# Patient Record
Sex: Female | Born: 1955 | Race: Black or African American | Hispanic: No | Marital: Single | Smoking: Never smoker
Health system: Southern US, Community
[De-identification: ages and names within clinical notes are randomized; demographics above are authoritative.]

## PROBLEM LIST (undated history)

## (undated) DIAGNOSIS — I639 Cerebral infarction, unspecified: Secondary | ICD-10-CM

## (undated) DIAGNOSIS — E7800 Pure hypercholesterolemia, unspecified: Secondary | ICD-10-CM

## (undated) DIAGNOSIS — I10 Essential (primary) hypertension: Secondary | ICD-10-CM

## (undated) HISTORY — PX: JOINT REPLACEMENT: SHX530

## (undated) HISTORY — PX: GASTROSTOMY W/ FEEDING TUBE: SUR642

## (undated) HISTORY — PX: ABDOMINAL HYSTERECTOMY: SHX81

## (undated) HISTORY — PX: CORONARY ARTERY BYPASS GRAFT: SHX141

## (undated) NOTE — *Deleted (*Deleted)
CRITICAL VALUE ALERT  Critical Value:  INR 5.5  Date & Time Notied:  07/16/2020 @ 0850  Provider Notified: Caleb Popp (Page)  Orders Received/Actions taken: Awaiting orders

---

## 2008-09-04 ENCOUNTER — Encounter: Payer: Self-pay | Admitting: Orthopedic Surgery

## 2008-09-14 ENCOUNTER — Encounter: Payer: Self-pay | Admitting: Orthopedic Surgery

## 2008-10-15 ENCOUNTER — Encounter: Payer: Self-pay | Admitting: Orthopedic Surgery

## 2009-08-26 ENCOUNTER — Encounter: Payer: Self-pay | Admitting: Orthopedic Surgery

## 2009-09-14 ENCOUNTER — Encounter: Payer: Self-pay | Admitting: Orthopedic Surgery

## 2010-06-05 ENCOUNTER — Encounter: Payer: Self-pay | Admitting: Orthopedic Surgery

## 2010-06-14 ENCOUNTER — Encounter: Payer: Self-pay | Admitting: Orthopedic Surgery

## 2017-12-09 DIAGNOSIS — I342 Nonrheumatic mitral (valve) stenosis: Secondary | ICD-10-CM

## 2017-12-09 HISTORY — DX: Nonrheumatic mitral (valve) stenosis: I34.2

## 2018-03-19 ENCOUNTER — Emergency Department (HOSPITAL_COMMUNITY)
Admission: EM | Admit: 2018-03-19 | Discharge: 2018-03-19 | Disposition: A | Discharge status: Home / Self Care | Payer: Self-pay | Attending: Emergency Medicine | Admitting: Emergency Medicine

## 2018-03-19 ENCOUNTER — Emergency Department (HOSPITAL_COMMUNITY): Payer: Self-pay

## 2018-03-19 ENCOUNTER — Other Ambulatory Visit: Payer: Self-pay

## 2018-03-19 ENCOUNTER — Encounter (HOSPITAL_COMMUNITY): Payer: Self-pay | Admitting: Emergency Medicine

## 2018-03-19 DIAGNOSIS — Z951 Presence of aortocoronary bypass graft: Secondary | ICD-10-CM | POA: Insufficient documentation

## 2018-03-19 DIAGNOSIS — M436 Torticollis: Secondary | ICD-10-CM | POA: Insufficient documentation

## 2018-03-19 DIAGNOSIS — I10 Essential (primary) hypertension: Secondary | ICD-10-CM | POA: Insufficient documentation

## 2018-03-19 HISTORY — DX: Pure hypercholesterolemia, unspecified: E78.00

## 2018-03-19 HISTORY — DX: Essential (primary) hypertension: I10

## 2018-03-19 MED ORDER — TRAMADOL HCL 50 MG PO TABS: 50.0000 mg | ORAL_TABLET | Freq: Four times a day (QID) | ORAL | 0 refills | Status: DC | PRN

## 2018-03-19 MED ORDER — CYCLOBENZAPRINE HCL 5 MG PO TABS: 5.0000 mg | ORAL_TABLET | Freq: Three times a day (TID) | ORAL | 0 refills | Status: DC | PRN

## 2018-03-19 MED ORDER — TRAMADOL HCL 50 MG PO TABS
50.0000 mg | ORAL_TABLET | Freq: Once | ORAL | Status: AC
  Administered 2018-03-19: 50 mg via ORAL
  Filled 2018-03-19: qty 1

## 2018-03-19 NOTE — ED Triage Notes (Signed)
Pt reports neck pain since Wednesday and she couldn't sleep last night.  Tylenol taken last night with no change.

## 2018-03-19 NOTE — Discharge Instructions (Addendum)
Alternate ice and heat to your neck.  Follow-up with your primary doctor for recheck or return to the ER for any worsening symptoms. °

## 2018-03-21 NOTE — ED Provider Notes (Signed)
[facility] EMERGENCY DEPARTMENT Provider Note   CSN: [id number] Arrival date & time: 03/19/18  0753     History   Chief Complaint Chief Complaint  Patient presents with  . Neck Pain    HPI Annette Muscatula Edler is a 62 y.o. female.  HPI  Annette Rogers is a 62 y.o. female who presents to the Emergency Department complaining of neck pain for 3 days.  She describes the pain as aching along the lower portion of her cervical spine and radiates to her left shoulder.  Pain is worse with movement of her neck and improves with rest.  States the pain was so bad on the night prior to arrival that she was unable to sleep.  She took tylenol without relief.  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   She denies chest pain, jaw pain, shortness of breath, pain, weakness or numbness of the upper extremities.    Past Medical History:  Diagnosis Date  . High cholesterol   . Hypertension     There are no active problems to display for this patient.   Past Surgical History:  Procedure Laterality Date  . ABDOMINAL HYSTERECTOMY    . CORONARY ARTERY BYPASS GRAFT    . JOINT REPLACEMENT       OB History   None      Home Medications    Prior to Admission medications   Medication Sig Start Date End Date Taking? Authorizing Provider  cyclobenzaprine (FLEXERIL) 5 MG tablet Take 1 tablet (5 mg total) by mouth 3 (three) times daily as needed. For muscle spasms.  May cause drowsiness 03/19/18   Ryenne Lynam, PA-C  traMADol (ULTRAM) 50 MG tablet Take 1 tablet (50 mg total) by mouth every 6 (six) hours as needed. 03/19/18   Pauline Ausriplett, Sanjit Mcmichael, PA-C    Family History History reviewed. No pertinent family history.  Social History Social History   Tobacco Use  . Smoking status: Never Smoker  Substance Use Topics  . Alcohol use: Never    Frequency: Never  . Drug use: Never     Allergies   Patient has no known allergies.   Review of Systems Review  of Systems  Constitutional: Negative for chills and fever.  HENT: Negative for facial swelling and trouble swallowing.   Eyes: Negative for visual disturbance.  Respiratory: Negative for shortness of breath.   Cardiovascular: Negative for chest pain.  Gastrointestinal: Negative for abdominal pain, nausea and vomiting.  Musculoskeletal: Positive for neck pain. Negative for arthralgias and joint swelling.  Skin: Negative for color change and rash.  Neurological: Negative for dizziness, syncope, weakness, numbness and headaches.  All other systems reviewed and are negative.    Physical Exam Updated Vital Signs BP (!) 162/72   Pulse 75   Temp 98.6 F (37 C)   Resp 16   Ht 5\' 3"  (1.6 m)   Wt 82.6 kg (182 lb)   SpO2 96%   BMI 32.24 kg/m   Physical Exam  Constitutional: She appears well-developed and well-nourished. No distress.  HENT:  Head: Atraumatic.  Mouth/Throat: Oropharynx is clear and moist.  Eyes: Pupils are equal, round, and reactive to light. EOM are normal.  Neck: Phonation normal. Spinous process tenderness and muscular tenderness present. No neck rigidity. No edema present.  ttp of the lower cervical spine and left paraspinal muscles.  No bony step offs  Cardiovascular: Normal rate, regular rhythm and intact distal pulses.  Pulmonary/Chest:  Effort normal and breath sounds normal. No respiratory distress. She exhibits no tenderness.  Musculoskeletal:  Grip strength is strong and symmetrical.    Neurological: She is alert. She has normal strength. No sensory deficit. GCS eye subscore is 4. GCS verbal subscore is 5. GCS motor subscore is 6.  CN II-XII intact.  No pronator drift, speech clear.    Skin: Skin is warm. Capillary refill takes less than 2 seconds. No rash noted.  Psychiatric: She has a normal mood and affect.  Nursing note and vitals reviewed.    ED Treatments / Results  Labs (all labs ordered are listed, but only abnormal results are displayed) Labs  Reviewed - No data to display  EKG None  Radiology No results found.  Procedures Procedures (including critical care time)  Medications Ordered in ED Medications  traMADol (ULTRAM) tablet 50 mg (50 mg Oral Given 03/19/18 0917)     Initial Impression / Assessment and Plan / ED Course  I have reviewed the triage vital signs and the nursing notes.  Pertinent labs & imaging results that were available during my care of the patient were reviewed by me and considered in my medical decision making (see chart for details).     Pt well appearing.  NV intact.  Pain to neck is reproduced with palpation and rotation.  Sx's felt to be related to torticollis.    XR results reviewed.  Pt reports feeling better after medications.  Agrees to close PCP f/u.  Return precautions discussed.    Final Clinical Impressions(s) / ED Diagnoses   Final diagnoses:  Torticollis    ED Discharge Orders        Ordered    traMADol (ULTRAM) 50 MG tablet  Every 6 hours PRN     03/19/18 1006    cyclobenzaprine (FLEXERIL) 5 MG tablet  3 times daily PRN     03/19/18 1006       Roxanne Orner, [place], PA-C 03/21/18 2239    Linwood Dibbles, MD 03/22/18 1318

## 2020-04-23 DIAGNOSIS — I272 Pulmonary hypertension, unspecified: Secondary | ICD-10-CM | POA: Insufficient documentation

## 2020-04-23 DIAGNOSIS — J449 Chronic obstructive pulmonary disease, unspecified: Secondary | ICD-10-CM

## 2020-04-23 DIAGNOSIS — E7800 Pure hypercholesterolemia, unspecified: Secondary | ICD-10-CM | POA: Insufficient documentation

## 2020-04-23 DIAGNOSIS — I480 Paroxysmal atrial fibrillation: Secondary | ICD-10-CM

## 2020-04-23 HISTORY — DX: Chronic obstructive pulmonary disease, unspecified: J44.9

## 2020-04-23 HISTORY — DX: Paroxysmal atrial fibrillation: I48.0

## 2020-04-25 DIAGNOSIS — Z9889 Other specified postprocedural states: Secondary | ICD-10-CM

## 2020-04-25 HISTORY — DX: Other specified postprocedural states: Z98.890

## 2020-04-27 DIAGNOSIS — I63511 Cerebral infarction due to unspecified occlusion or stenosis of right middle cerebral artery: Secondary | ICD-10-CM | POA: Insufficient documentation

## 2020-04-29 DIAGNOSIS — Z954 Presence of other heart-valve replacement: Secondary | ICD-10-CM

## 2020-04-29 DIAGNOSIS — G8194 Hemiplegia, unspecified affecting left nondominant side: Secondary | ICD-10-CM

## 2020-04-29 DIAGNOSIS — Z789 Other specified health status: Secondary | ICD-10-CM | POA: Insufficient documentation

## 2020-04-29 HISTORY — DX: Hemiplegia, unspecified affecting left nondominant side: G81.94

## 2020-04-29 HISTORY — DX: Presence of other heart-valve replacement: Z95.4

## 2020-05-06 DIAGNOSIS — D75839 Thrombocytosis, unspecified: Secondary | ICD-10-CM | POA: Insufficient documentation

## 2020-05-15 DIAGNOSIS — Z7901 Long term (current) use of anticoagulants: Secondary | ICD-10-CM | POA: Insufficient documentation

## 2020-05-31 DIAGNOSIS — E213 Hyperparathyroidism, unspecified: Secondary | ICD-10-CM

## 2020-05-31 DIAGNOSIS — Z934 Other artificial openings of gastrointestinal tract status: Secondary | ICD-10-CM

## 2020-05-31 HISTORY — DX: Other artificial openings of gastrointestinal tract status: Z93.4

## 2020-05-31 HISTORY — DX: Hyperparathyroidism, unspecified: E21.3

## 2020-07-15 ENCOUNTER — Inpatient Hospital Stay (HOSPITAL_COMMUNITY)
Admission: EM | Admit: 2020-07-15 | Discharge: 2020-07-24 | DRG: 641 | Disposition: A | Discharge status: Home Health | Payer: Medicare Other | Attending: Family Medicine | Admitting: Family Medicine

## 2020-07-15 ENCOUNTER — Other Ambulatory Visit: Payer: Self-pay

## 2020-07-15 ENCOUNTER — Encounter (HOSPITAL_COMMUNITY): Payer: Self-pay | Admitting: Emergency Medicine

## 2020-07-15 DIAGNOSIS — E213 Hyperparathyroidism, unspecified: Secondary | ICD-10-CM

## 2020-07-15 DIAGNOSIS — Z952 Presence of prosthetic heart valve: Secondary | ICD-10-CM

## 2020-07-15 DIAGNOSIS — Z90710 Acquired absence of both cervix and uterus: Secondary | ICD-10-CM

## 2020-07-15 DIAGNOSIS — K9423 Gastrostomy malfunction: Secondary | ICD-10-CM | POA: Diagnosis present

## 2020-07-15 DIAGNOSIS — I69322 Dysarthria following cerebral infarction: Secondary | ICD-10-CM

## 2020-07-15 DIAGNOSIS — I69391 Dysphagia following cerebral infarction: Secondary | ICD-10-CM

## 2020-07-15 DIAGNOSIS — Z8249 Family history of ischemic heart disease and other diseases of the circulatory system: Secondary | ICD-10-CM

## 2020-07-15 DIAGNOSIS — K259 Gastric ulcer, unspecified as acute or chronic, without hemorrhage or perforation: Secondary | ICD-10-CM | POA: Insufficient documentation

## 2020-07-15 DIAGNOSIS — I69354 Hemiplegia and hemiparesis following cerebral infarction affecting left non-dominant side: Secondary | ICD-10-CM

## 2020-07-15 DIAGNOSIS — B952 Enterococcus as the cause of diseases classified elsewhere: Secondary | ICD-10-CM | POA: Diagnosis present

## 2020-07-15 DIAGNOSIS — I10 Essential (primary) hypertension: Secondary | ICD-10-CM | POA: Diagnosis present

## 2020-07-15 DIAGNOSIS — Z515 Encounter for palliative care: Secondary | ICD-10-CM

## 2020-07-15 DIAGNOSIS — R531 Weakness: Secondary | ICD-10-CM | POA: Diagnosis not present

## 2020-07-15 DIAGNOSIS — M549 Dorsalgia, unspecified: Secondary | ICD-10-CM

## 2020-07-15 DIAGNOSIS — Z7189 Other specified counseling: Secondary | ICD-10-CM

## 2020-07-15 DIAGNOSIS — D509 Iron deficiency anemia, unspecified: Secondary | ICD-10-CM

## 2020-07-15 DIAGNOSIS — R1110 Vomiting, unspecified: Secondary | ICD-10-CM

## 2020-07-15 DIAGNOSIS — D638 Anemia in other chronic diseases classified elsewhere: Secondary | ICD-10-CM | POA: Diagnosis present

## 2020-07-15 DIAGNOSIS — E876 Hypokalemia: Secondary | ICD-10-CM | POA: Diagnosis present

## 2020-07-15 DIAGNOSIS — Z9889 Other specified postprocedural states: Secondary | ICD-10-CM

## 2020-07-15 DIAGNOSIS — M7989 Other specified soft tissue disorders: Secondary | ICD-10-CM

## 2020-07-15 DIAGNOSIS — R791 Abnormal coagulation profile: Secondary | ICD-10-CM | POA: Diagnosis present

## 2020-07-15 DIAGNOSIS — Z7901 Long term (current) use of anticoagulants: Secondary | ICD-10-CM

## 2020-07-15 DIAGNOSIS — K219 Gastro-esophageal reflux disease without esophagitis: Secondary | ICD-10-CM

## 2020-07-15 DIAGNOSIS — I480 Paroxysmal atrial fibrillation: Secondary | ICD-10-CM | POA: Diagnosis present

## 2020-07-15 DIAGNOSIS — E785 Hyperlipidemia, unspecified: Secondary | ICD-10-CM | POA: Diagnosis present

## 2020-07-15 DIAGNOSIS — M79602 Pain in left arm: Secondary | ICD-10-CM

## 2020-07-15 DIAGNOSIS — Z7982 Long term (current) use of aspirin: Secondary | ICD-10-CM

## 2020-07-15 DIAGNOSIS — R54 Age-related physical debility: Secondary | ICD-10-CM | POA: Diagnosis present

## 2020-07-15 DIAGNOSIS — G8194 Hemiplegia, unspecified affecting left nondominant side: Secondary | ICD-10-CM | POA: Diagnosis present

## 2020-07-15 DIAGNOSIS — I342 Nonrheumatic mitral (valve) stenosis: Secondary | ICD-10-CM | POA: Diagnosis present

## 2020-07-15 DIAGNOSIS — Y848 Other medical procedures as the cause of abnormal reaction of the patient, or of later complication, without mention of misadventure at the time of the procedure: Secondary | ICD-10-CM | POA: Diagnosis present

## 2020-07-15 DIAGNOSIS — Z20822 Contact with and (suspected) exposure to covid-19: Secondary | ICD-10-CM | POA: Diagnosis present

## 2020-07-15 DIAGNOSIS — Z954 Presence of other heart-valve replacement: Secondary | ICD-10-CM

## 2020-07-15 DIAGNOSIS — E210 Primary hyperparathyroidism: Secondary | ICD-10-CM | POA: Diagnosis present

## 2020-07-15 DIAGNOSIS — Z79899 Other long term (current) drug therapy: Secondary | ICD-10-CM

## 2020-07-15 DIAGNOSIS — D649 Anemia, unspecified: Secondary | ICD-10-CM | POA: Diagnosis present

## 2020-07-15 DIAGNOSIS — N390 Urinary tract infection, site not specified: Secondary | ICD-10-CM | POA: Diagnosis present

## 2020-07-15 DIAGNOSIS — E871 Hypo-osmolality and hyponatremia: Secondary | ICD-10-CM | POA: Diagnosis present

## 2020-07-15 DIAGNOSIS — J449 Chronic obstructive pulmonary disease, unspecified: Secondary | ICD-10-CM | POA: Diagnosis present

## 2020-07-15 DIAGNOSIS — Z951 Presence of aortocoronary bypass graft: Secondary | ICD-10-CM

## 2020-07-15 HISTORY — DX: Hypercalcemia: E83.52

## 2020-07-15 HISTORY — DX: Gastro-esophageal reflux disease without esophagitis: K21.9

## 2020-07-15 LAB — COMPREHENSIVE METABOLIC PANEL
ALT: 17 U/L (ref 0–44)
AST: 42 U/L — ABNORMAL HIGH (ref 15–41)
Albumin: 3.5 g/dL (ref 3.5–5.0)
Alkaline Phosphatase: 92 U/L (ref 38–126)
Anion gap: 13 (ref 5–15)
BUN: 26 mg/dL — ABNORMAL HIGH (ref 8–23)
CO2: 25 mmol/L (ref 22–32)
Calcium: 14.5 mg/dL (ref 8.9–10.3)
Chloride: 96 mmol/L — ABNORMAL LOW (ref 98–111)
Creatinine, Ser: 0.73 mg/dL (ref 0.44–1.00)
GFR, Estimated: >60 mL/min (ref >60)
Glucose, Bld: 96 mg/dL (ref 70–99)
Potassium: 3.2 mmol/L — ABNORMAL LOW (ref 3.5–5.1)
Sodium: 134 mmol/L — ABNORMAL LOW (ref 135–145)
Total Bilirubin: 1.5 mg/dL — ABNORMAL HIGH (ref 0.3–1.2)
Total Protein: 8 g/dL (ref 6.5–8.1)

## 2020-07-15 LAB — URINALYSIS, ROUTINE W REFLEX MICROSCOPIC
Bilirubin Urine: NEGATIVE
Glucose, UA: NEGATIVE mg/dL
Ketones, ur: 80 mg/dL — AB
Nitrite: NEGATIVE
Protein, ur: 30 mg/dL — AB
Specific Gravity, Urine: 1.02 (ref 1.005–1.030)
WBC, UA: >50 WBC/hpf — ABNORMAL HIGH (ref 0–5)
pH: 6 (ref 5.0–8.0)

## 2020-07-15 LAB — CBC WITH DIFFERENTIAL/PLATELET
Abs Immature Granulocytes: 0.03 10*3/uL (ref 0.00–0.07)
Basophils Absolute: 0 10*3/uL (ref 0.0–0.1)
Basophils Relative: 0 %
Eosinophils Absolute: 0.1 10*3/uL (ref 0.0–0.5)
Eosinophils Relative: 1 %
HCT: 37.5 % (ref 36.0–46.0)
Hemoglobin: 11.3 g/dL — ABNORMAL LOW (ref 12.0–15.0)
Immature Granulocytes: 0 %
Lymphocytes Relative: 21 %
Lymphs Abs: 2.3 10*3/uL (ref 0.7–4.0)
MCH: 26.2 pg (ref 26.0–34.0)
MCHC: 30.1 g/dL (ref 30.0–36.0)
MCV: 87 fL (ref 80.0–100.0)
Monocytes Absolute: 0.6 10*3/uL (ref 0.1–1.0)
Monocytes Relative: 6 %
Neutro Abs: 7.6 10*3/uL (ref 1.7–7.7)
Neutrophils Relative %: 72 %
Platelets: 343 10*3/uL (ref 150–400)
RBC: 4.31 MIL/uL (ref 3.87–5.11)
RDW: 15.6 % — ABNORMAL HIGH (ref 11.5–15.5)
WBC: 10.7 10*3/uL — ABNORMAL HIGH (ref 4.0–10.5)
nRBC: 0 % (ref 0.0–0.2)

## 2020-07-15 MED ORDER — FUROSEMIDE 10 MG/ML IJ SOLN
40.0000 mg | Freq: Once | INTRAMUSCULAR | Status: AC
  Administered 2020-07-15: 40 mg via INTRAVENOUS
  Filled 2020-07-15: qty 4

## 2020-07-15 MED ORDER — POTASSIUM CHLORIDE IN NACL 20-0.9 MEQ/L-% IV SOLN
INTRAVENOUS | Status: AC
  Filled 2020-07-15: qty 1000

## 2020-07-15 MED ORDER — SODIUM CHLORIDE 0.9 % IV SOLN: INTRAVENOUS | Status: DC

## 2020-07-15 NOTE — ED Triage Notes (Signed)
Pt c/o weakness and nausea x one week.

## 2020-07-15 NOTE — ED Notes (Signed)
Received call from Macarthur Critchley, RN about critical value and reported to EDP

## 2020-07-15 NOTE — H&P (Signed)
History and Physical    Annette Rogers UKG:[id number] DOB: [birth year] DOA: 07/15/2020  PCP: Patient, No Pcp Per   Patient coming from: Home.  I have personally briefly reviewed patient's old medical records in [facility]  Chief Complaint: Weakness and nausea for a week.  HPI: Annette Rogers is a 64 y.o. 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  She is unable to elaborate much on her symptoms, but answers simple questions.  She denies fever, chills, but feels fatigued.  No sore throat, rhinorrhea, dyspnea, wheezing or hemoptysis.  Nuys chest pain, palpitations, diaphoresis, PND, orthopnea.  Denies abdominal pain, diarrhea, constipation, melena or hematochezia.  She denies dysuria, frequency or hematuria to her knowledge.  ED Course: 97.7 F, pulse 108, respiration 15, blood pressure 114/52 mmHg and O2 sat 98% on room air were her vital signs at the time of arrival.  A urinalysis showed ketonuria of 80 and proteinuria 30 mg/dL, small hemoglobinuria moderate leukocyte esterase.  6-10 RBC and more than 50 WBC per hpf with rare bacteria on microscopic examination.  CBC had a white count of 10.7 with an unremarkable differential, hemoglobin 11.3 g/dL platelets 762.  CMP sodium 134, potassium 3.2, chloride 96 and CO2 25 mmol/L.BUN was 26, creatinine 0.73 and calcium of 14.5 mg/dL.  AST was 42 units/L and total bilirubin 1.5 mg/dL.  The rest of the LFTs were within normal range.  Magnesium was 1.4 and phosphorus 1.9  mg/dL.  Review of Systems: As per HPI otherwise all other systems reviewed and are negative.  Past Medical History:  Diagnosis Date  . COPD (chronic obstructive pulmonary disease) (HCC) 04/23/2020  . Gastrojejunostomy tube status (HCC) 05/31/2020  . GERD (gastroesophageal reflux disease) 07/15/2020  . High cholesterol   . Hypercalcemia 07/15/2020  . Hyperparathyroidism (HCC) 05/31/2020  . Hypertension   . Left hemiplegia (HCC) 04/29/2020  . Non-rheumatic mitral valve stenosis 12/09/2017  . Paroxysmal A-fib (HCC) 04/23/2020  . S/P mitral valve replacement with metallic valve 04/29/2020   Formatting of this note might be different from the original. [employer]ent mechanical  Procedures:   1. Mitral valve replacement (25 mm [employer]) (CPT [phone]) 2. Tricuspid valve repair (annuloplasty only)  (30  mm CE Physio ring) (CPT 33464) 4. 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 Procedures:   1. Mitral valve replacement (25 mm [employer]) (CPT [phone]) 2. Tricuspid valve repair (annuloplasty only)  (30  mm CE Physio ring) (CPT 33464) 4. Reoperation on heart/redo-sternotomy (s/p MVR 2005)    Past Surgical History:  Procedure Laterality Date  . ABDOMINAL HYSTERECTOMY    . CORONARY ARTERY BYPASS GRAFT    . GASTROSTOMY W/ FEEDING TUBE    . JOINT REPLACEMENT      Social History  reports that she has never smoked. She does not have any smokeless tobacco history on file. She reports that she does not drink alcohol and does not use drugs.  No Known Allergies  Family history of hypertension on several  relatives  Prior to Admission medications   Medication Sig Start Date End Date Taking? Authorizing Provider  acetaminophen (TYLENOL) 325 MG tablet Take by mouth. 06/24/20  Yes [provider]  amiodarone (PACERONE) 200 MG tablet Take by mouth. 06/22/20 06/22/21 Yes  [provider]  amLODipine (NORVASC) 5 MG tablet Take 5 mg by mouth daily. 04/03/20  Yes [provider]  aspirin 81 MG EC tablet Take by mouth. 06/21/20  Yes [provider]  atorvastatin (LIPITOR) 40 MG tablet Take by mouth. 06/21/20 06/21/21 Yes [provider]  carvedilol (COREG) 12.5 MG tablet Take by mouth.   Yes [provider]  Cholecalciferol 50 MCG (2000 UT) TABS Take by mouth. 06/21/20 06/21/21 Yes [provider]  cinacalcet (SENSIPAR) 90 MG tablet Take by mouth. 06/24/20 09/22/20 Yes [provider]  cyclobenzaprine (FLEXERIL) 5 MG tablet Take 1 tablet (5 mg total) by mouth 3 (three) times daily as needed. For muscle spasms.  May cause drowsiness 03/19/18  Yes Triplett, Tammy, PA-C  diclofenac Sodium (VOLTAREN) 1 % GEL Apply topically. 06/21/20 06/21/21 Yes [provider]  furosemide (LASIX) 40 MG tablet Take by mouth. 06/21/20  Yes [provider]  gabapentin (NEURONTIN) 400 MG capsule Take by mouth. 06/21/20 06/21/21 Yes [provider]  mirtazapine (REMERON) 30 MG tablet Take 30 mg by mouth at bedtime. 07/10/20  Yes [provider]  nystatin (MYCOSTATIN/NYSTOP) powder Apply topically. 06/24/20 06/24/21 Yes [provider]  omeprazole (PRILOSEC) 20 MG capsule Take 20 mg by mouth daily. 06/24/20  Yes [provider]  ondansetron (ZOFRAN-ODT) 4 MG disintegrating tablet Take by mouth. 06/21/20 07/21/20 Yes [provider]  pantoprazole (PROTONIX) 40 MG tablet Take by mouth.   Yes [provider]  traMADol (ULTRAM) 50 MG tablet Take 1 tablet (50 mg total) by mouth every 6 (six) hours as needed. 03/19/18  Yes Triplett, Tammy, PA-C  traZODone (DESYREL) 50 MG tablet Take by mouth. 06/21/20 06/21/21 Yes [provider]  warfarin (COUMADIN) 2 MG tablet Take by mouth. 06/21/20 06/21/21 Yes [provider]  ipratropium-albuterol (DUONEB) 0.5-2.5 (3) MG/3ML SOLN  Inhale into the lungs. Patient not taking: Reported on 07/15/2020 06/21/20 06/16/21  [provider]   Physical Exam: Vitals:   07/15/20 2100 07/15/20 2130 07/15/20 2230 07/15/20 2330  BP: 109/69 108/86 (!) 93/53 112/70  Pulse: 98 (!) 104 99 98  Resp: 17 19 (!) 26 16  Temp:      TempSrc:      SpO2: 98% 100% 98% 98%  Weight:      Height:       Constitutional: Looks chronically ill, but nontoxic. Eyes: PERRL, lids and conjunctivae are pale. ENMT: Mucous membranes are moist. Posterior pharynx clear of any exudate or lesions. Neck: normal, supple, no masses, no thyromegaly Respiratory: Clear to auscultation bilaterally, no wheezing, no crackles. Normal respiratory effort. No accessory muscle use.  Cardiovascular: Regular rate and rhythm, positive systolic murmur, no rubs / gallops. No extremity edema. 2+ pedal pulses. No carotid bruits.  Abdomen: Obese, nondistended.  BS positive.  No tenderness, no masses palpated. No hepatosplenomegaly. Bowel sounds positive.  Musculoskeletal: no clubbing / cyanosis. Good ROM, no contractures. Normal muscle tone.  Skin: no rashes, lesions, ulcers on very limited dermatological examination. Neurologic: Left facial droop and left-sided hemiplegia.  Slurred speech. Psychiatric: Alert and oriented x 2, partially oriented to date and situation.  Labs on Admission: I have personally reviewed following labs and imaging studies  CBC: Recent Labs  Lab 07/15/20 2107  WBC 10.7*  NEUTROABS 7.6  HGB 11.3*  HCT 37.5  MCV 87.0  PLT 343   Basic Metabolic Panel: Recent Labs  Lab 07/15/20 2107  NA 134*  K 3.2*  CL 96*  CO2 25  GLUCOSE 96  BUN 26*  CREATININE 0.73  CALCIUM 14.5*   GFR: Estimated Creatinine Clearance: 61.2 mL/min (by C-G formula based on SCr of 0.73 mg/dL).  Liver Function Tests: Recent Labs  Lab 07/15/20 2107  AST 42*  ALT 17  ALKPHOS 92  BILITOT 1.5*  PROT 8.0  ALBUMIN 3.5   Urine analysis:    Component Value  Date/Time   COLORURINE YELLOW 07/15/2020 2132   APPEARANCEUR HAZY (A) 07/15/2020 2132   LABSPEC 1.020 07/15/2020 2132   PHURINE 6.0 07/15/2020 2132   GLUCOSEU NEGATIVE 07/15/2020 2132   HGBUR SMALL (A) 07/15/2020 2132   BILIRUBINUR NEGATIVE 07/15/2020 2132   KETONESUR 80 (A) 07/15/2020 2132   PROTEINUR 30 (A) 07/15/2020 2132   NITRITE NEGATIVE 07/15/2020 2132   LEUKOCYTESUR MODERATE (A) 07/15/2020 2132   Radiological Exams on Admission: No results found.   12/06/2017 ECHO Summary  1. Overall left ventricular ejection fraction is estimated at 55to 60%.  2. Moderately enlarged right ventricle.  3. Normal global left ventricular systolic function.  4. Mildly reduced RV systolic function.  5. Severely dilated left atrium.  6. Severely dilated right atrium.  7. A mitral annuloplasty ring is present by report but not easily visualized.  8. Moderate mitral valve regurgitation.  9. Severe mitral stenosis with mean gradient of 14 mmHg.  10. Moderate tricuspid regurgitation.  11. Mild to moderate aortic regurgitation.  12. Severely elevated pulmonary artery systolic pressure.  13. Pulmonary artery systolic pressure estimated at 95 mmHg.  14. No prior studies are available for comparison.   EKG: Independently reviewed. Vent. rate 108 BPM PR interval * ms QRS duration 79 ms QT/QTc 348/467 ms P-R-T axes 55 64 -18 Sinus tachycardia Repol abnrm suggests ischemia, anterolateral No old tracing to compare  Assessment/Plan Principal Problem:   Hypercalcemia   Hyperparathyroidism (HCC) Observation/telemetry. NS vigorous IV hydration. Furosemide 20 mg every 6 hours. Biphosphonate therapy with zoledronic acid. Check PTH level. Monitor phosphorus and magnesium level. Further information on [facility] work-up on care everywhere tab  Active Problems:   Hypokalemia Replacing. Follow-up potassium level.    Hyponatremia Continue normal saline infusion. Monitor sodium level.     Hypophosphatemia Replacing. Follow-up phosphorus level  Hypomagnesemia Replacement given. Follow-up magnesium level.    Paroxysmal A-fib (HCC) CHA?DS?-VASc Score of at least 7. On amiodarone and carvedilol for rate control. Warfarin per pharmacy for anticoagulation purposes.    Hypertension Continue amlodipine 5 mg p.o. daily. Continue carvedilol 12.5 mg p.o. daily. Monitor blood pressure.    COPD (chronic obstructive pulmonary disease) (HCC) Supplemental oxygen as needed. Bronchodilators as needed.    GERD (gastroesophageal reflux disease) Continue PPI    Left hemiplegia (HCC) Supportive care. Consider PT evaluation.    Non-rheumatic mitral valve stenosis with mechanical valve. Warfarin per pharmacy    Normocytic anemia Monitor H&H     DVT prophylaxis: On warfarin. Code Status:   Full code. Family Communication: Disposition Plan:   Patient is from:  Home.  Anticipated DC to:  TBD.  Anticipated DC date:  07/17/2020.  Anticipated DC barriers: Clinical status.  Consults called: Admission status:  Observation/telemetry.   Severity of Illness: High due to severe hypercalcemia along with other multiple medical conditions.  Bobette Mo  MD [facility]  How to contact the [facility] Attending or Consulting provider 7A - 7P or covering provider during after hours 7P -7A, for this patient?   1. Check the care team in [facility] and look for a) attending/consulting [facility] provider listed and b) the [facility] team listed 2. Log into [email or web address] and use [place]'s universal password to access. If you do not have the password, please contact the hospital operator. 3. Locate the [facility] provider you are looking for under [facility] and page to a number that you can be directly reached. 4. If you still have difficulty reaching the provider, please page the [facility] (Director on Call) for the Hospitalists listed on amion for assistance.  07/15/2020, 11:44 PM   This document  was prepared using Dragon voice recognition software and may contain some transcription errors.

## 2020-07-15 NOTE — ED Notes (Signed)
Daughter's number is [phone]

## 2020-07-15 NOTE — ED Notes (Signed)
Date and time results received: 07/15/20  2312  Test: Calcium Critical Value: 14.5  Name of Provider Notified: Dr. Effie Shy  Orders Received? Or Actions Taken?: n/a

## 2020-07-15 NOTE — ED Provider Notes (Signed)
[facility] EMERGENCY DEPARTMENT Provider Note   CSN: [id number] Arrival date & time: 07/15/20  1921     History Chief Complaint  Patient presents with  . Weakness    Annette Rogers is a 64 y.o. female.  HPI Patient presents for evaluation of weakness.  She was discharged from hospital rehab on 06/24/2020 after a 3-week visit.  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  At the time of discharge, home health was set up, and the Child psychotherapist, at [facility], felt that the patient's care needs were so great that the patient might not thriving at home setting.  Family members were offered skilled nursing facility placement and deferred.  Patient came by EMS, without family members here.  She states that she is feeling weak today and states that her nurse told her that her "blood was low."  The patient is unable to specify any further concerns.  Level 5 caveat-poor historian   Past Medical History:  Diagnosis Date  . High cholesterol   . Hypertension     There are no problems to display for this patient.   Past Surgical History:  Procedure Laterality Date  . ABDOMINAL HYSTERECTOMY    . CORONARY ARTERY BYPASS GRAFT    . GASTROSTOMY W/ FEEDING TUBE    . JOINT REPLACEMENT       OB History   No obstetric history on file.     No family history on file.  Social History   Tobacco Use  . Smoking status: Never Smoker  Substance Use Topics  . Alcohol use: Never  . Drug use: Never    Home Medications Prior to Admission medications   Medication Sig Start Date End Date Taking? Authorizing Provider  acetaminophen (TYLENOL) 325 MG tablet Take by mouth. 06/24/20  Yes [provider]  amiodarone (PACERONE) 200 MG tablet Take by mouth. 06/22/20 06/22/21 Yes [provider]  amLODipine (NORVASC) 5 MG tablet Take 5 mg by mouth daily. 04/03/20  Yes [provider]  aspirin 81 MG EC tablet Take by mouth. 06/21/20  Yes  [provider]  atorvastatin (LIPITOR) 40 MG tablet Take by mouth. 06/21/20 06/21/21 Yes [provider]  carvedilol (COREG) 12.5 MG tablet Take by mouth.   Yes [provider]  Cholecalciferol 50 MCG (2000 UT) TABS Take by mouth. 06/21/20 06/21/21 Yes [provider]  cinacalcet (SENSIPAR) 90 MG tablet Take by mouth. 06/24/20 09/22/20 Yes [provider]  cyclobenzaprine (FLEXERIL) 5 MG tablet Take 1 tablet (5 mg total) by mouth 3 (three) times daily as needed. For muscle spasms.  May cause drowsiness 03/19/18  Yes Triplett, Tammy, PA-C  diclofenac Sodium (VOLTAREN) 1 % GEL Apply topically. 06/21/20 06/21/21 Yes [provider]  furosemide (LASIX) 40 MG tablet Take by mouth. 06/21/20  Yes [provider]  gabapentin (NEURONTIN) 400 MG capsule Take by mouth. 06/21/20 06/21/21 Yes [provider]  mirtazapine (REMERON) 30 MG tablet Take 30 mg by mouth at bedtime. 07/10/20  Yes [provider]  nystatin (MYCOSTATIN/NYSTOP) powder Apply topically. 06/24/20 06/24/21 Yes [provider]  omeprazole (PRILOSEC) 20 MG capsule Take 20 mg by mouth daily. 06/24/20  Yes [provider]  ondansetron (ZOFRAN-ODT) 4 MG disintegrating tablet Take by mouth. 06/21/20 07/21/20 Yes [provider]  pantoprazole (PROTONIX) 40 MG tablet Take by mouth.   Yes [provider]  traMADol (ULTRAM) 50 MG tablet Take 1 tablet (50 mg total) by mouth every 6 (  six) hours as needed. 03/19/18  Yes Triplett, Tammy, PA-C  traZODone (DESYREL) 50 MG tablet Take by mouth. 06/21/20 06/21/21 Yes [provider]  warfarin (COUMADIN) 2 MG tablet Take by mouth. 06/21/20 06/21/21 Yes [provider]  ipratropium-albuterol (DUONEB) 0.5-2.5 (3) MG/3ML SOLN Inhale into the lungs. Patient not taking: Reported on 07/15/2020 06/21/20 06/16/21  [provider]    Allergies    Patient has no known allergies.  Review of Systems    Review of Systems  All other systems reviewed and are negative.   Physical Exam Updated Vital Signs BP (!) 93/53   Pulse 99   Temp 97.7 F (36.5 C) (Oral)   Resp (!) 26   Ht 5' (1.524 m)   Wt 71.7 kg   SpO2 98%   BMI 30.86 kg/m   Physical Exam Vitals and nursing note reviewed.  Constitutional:      General: She is not in acute distress.    Appearance: She is well-developed. She is not ill-appearing, toxic-appearing or diaphoretic.  HENT:     Head: Normocephalic and atraumatic.     Right Ear: External ear normal.     Left Ear: External ear normal.     Mouth/Throat:     Mouth: Mucous membranes are moist.     Pharynx: No oropharyngeal exudate or posterior oropharyngeal erythema.  Eyes:     Conjunctiva/sclera: Conjunctivae normal.     Pupils: Pupils are equal, round, and reactive to light.  Neck:     Trachea: Phonation normal.  Cardiovascular:     Rate and Rhythm: Regular rhythm. Tachycardia present.     Heart sounds: Normal heart sounds.  Pulmonary:     Effort: Pulmonary effort is normal. No respiratory distress.     Breath sounds: Normal breath sounds. No stridor.  Abdominal:     General: There is no distension.     Palpations: Abdomen is soft.     Tenderness: There is no abdominal tenderness.  Musculoskeletal:        General: Normal range of motion.     Cervical back: Normal range of motion and neck supple.  Skin:    General: Skin is warm and dry.  Neurological:     Mental Status: She is alert and oriented to person, place, and time.     Cranial Nerves: No cranial nerve deficit.     Sensory: No sensory deficit.     Motor: No abnormal muscle tone.     Coordination: Coordination normal.     Comments: Mild dysarthria is present.  No aphasia.  Dense left hemiplegia.  Psychiatric:        Mood and Affect: Mood normal.        Behavior: Behavior normal.     ED Results / Procedures / Treatments   Labs (all labs ordered are listed, but only abnormal results are  displayed) Labs Reviewed  COMPREHENSIVE METABOLIC PANEL - Abnormal; Notable for the following components:      Result Value   Sodium 134 (*)    Potassium 3.2 (*)    Chloride 96 (*)    BUN 26 (*)    Calcium 14.5 (*)    AST 42 (*)    Total Bilirubin 1.5 (*)    All other components within normal limits  CBC WITH DIFFERENTIAL/PLATELET - Abnormal; Notable for the following components:   WBC 10.7 (*)    Hemoglobin 11.3 (*)    RDW 15.6 (*)    All other components within normal  limits  URINALYSIS, ROUTINE W REFLEX MICROSCOPIC - Abnormal; Notable for the following components:   APPearance HAZY (*)    Hgb urine dipstick SMALL (*)    Ketones, ur 80 (*)    Protein, ur 30 (*)    Leukocytes,Ua MODERATE (*)    WBC, UA >50 (*)    Bacteria, UA RARE (*)    All other components within normal limits  RESPIRATORY PANEL BY RT PCR (FLU A&B, COVID)    EKG EKG Interpretation  Date/Time:  Monday July 15 2020 19:28:48 EDT Ventricular Rate:  108 PR Interval:    QRS Duration: 79 QT Interval:  348 QTC Calculation: 467 R Axis:   64 Text Interpretation: Sinus tachycardia Repol abnrm suggests ischemia, anterolateral No old tracing to compare Confirmed by Mancel BaleWentz, Dejon Lukas [phone]) on 07/15/2020 8:18:55 PM   Radiology No results found.  Procedures Procedures (including critical care time)  Medications Ordered in ED Medications  0.9 %  sodium chloride infusion ( Intravenous New Bag/Given 07/15/20 2130)    ED Course  I have reviewed the triage vital signs and the nursing notes.  Pertinent labs & imaging results that were available during my care of the patient were reviewed by me and considered in my medical decision making (see chart for details).  Clinical Course as of Jul 15 2317  Sheral FlowMon Jul 15, 2020  2218 I received a call from the patient's sister who lives in [place] but sees the patient regularly.  She states the patient is here because of pain in her left arm and leg, which has been  somewhat worse since hospital discharge.  Daughter's name is [place]Monica.  She does not think that the patient has new weakness.   [EW]  2306 Normal except presence of blood, ketones, protein, leukocytes, white cells, and rare bacteria.  Urine culture ordered  Urinalysis, Routine w reflex microscopic Urine, Catheterized(!) [EW]  2306 Normal except sodium low, potassium low, chloride low, BUN high, calcium extremely high, AST high, total bilirubin high  Comprehensive metabolic panel(!!) [EW]  2307 Normal except white count high, hemoglobin low  CBC with Differential(!) [EW]  2313 Protein(!): 30 [EW]  2314 AST(!): 42 [EW]  2315 I discussed the case with the patient's granddaughter, Lennox PippinsKiesha Climer.  She states that the patient is here because of pain in her left arm and left leg.  She states that she was told to stop giving the patient morphine because she was feeling weak.  She recalls that the patient's calcium was elevated during the hospitalization and rehab, at [facility].  She is not sure what was done about that.  She also states that the patient is not currently utilizing her tube feeding, and is eating by mouth, normally.   [EW]    Clinical Course User Index [EW] Mancel BaleWentz, Aireal Slater, MD   MDM Rules/Calculators/A&P                           Patient Vitals for the past 24 hrs:  BP Temp Temp src Pulse Resp SpO2 Height Weight  07/15/20 2230 (!) 93/53 -- -- 99 (!) 26 98 % -- --  07/15/20 2130 108/86 -- -- (!) 104 19 100 % -- --  07/15/20 2100 109/69 -- -- 98 17 98 % -- --  07/15/20 2032 105/68 -- -- 100 18 99 % -- --  07/15/20 2006 -- -- -- -- 16 -- -- --  07/15/20 1925 (!) 114/52 97.7 F (36.5 C)  Oral (!) 108 15 98 % -- --  07/15/20 1922 -- -- -- -- -- -- 5' (1.524 m) 71.7 kg    11:18 PM Reevaluation with update and discussion. After initial assessment and treatment, an updated evaluation reveals no change in clinical status, findings discussed with the patient and all questions were  answered. Mancel Bale   Medical Decision Making:  This patient is presenting for evaluation of weakness, recently discharged from a rehab, with significant help required for ADLs which she is currently getting in-home setting, living with granddaughter and "a friend", which does require a range of treatment options, and is a complaint that involves a moderate risk of morbidity and mortality. The differential diagnoses include acute metabolic or infectious disorders, progressive neurologic disorder. I decided to review old records, and in summary elderly female, currently living at home, following recent hospitalization and rehabilitation stay for right MCA stroke.  I obtained additional historical information from family members on the telephone.  Clinical Laboratory Tests Ordered, included CBC, Metabolic panel and Urinalysis. Review indicates mild calcium elevation with mild renal insufficiency.  Urinalysis abnormal, urine culture sent.   Cardiac Monitor Tracing which shows sinus rhythm    Critical Interventions-clinical evaluation, laboratory testing, EKG, observation, discussion with family members, reevaluation  After These Interventions, the Patient was reevaluated and was found to have presented for evaluation of atraumatic pain left arm and left leg, worsening since recent discharge from a rehabilitation stay.  Incidental hypercalcemia is present, likely causative or indicative of source of pain.  This will require ongoing management in the hospital setting with calcium binder treatment. No evidence for acute unstable cardiac disorder.  CRITICAL CARE- no Performed by: Mancel Bale  Nursing Notes Reviewed/ Care Coordinated Applicable Imaging Reviewed Interpretation of Laboratory Data incorporated into ED treatment   11:23 PM-Consult complete with hospitalist. Patient case explained and discussed.  He agrees to admit patient for further evaluation and treatment. Call ended at  11:35 PM  Plan: Admit    Final Clinical Impression(s) / ED Diagnoses Final diagnoses:  Hypercalcemia  Pain of left upper extremity    Rx / DC Orders ED Discharge Orders    None       Mancel Bale, MD 07/15/20 2337

## 2020-07-16 ENCOUNTER — Encounter (HOSPITAL_COMMUNITY): Payer: Self-pay | Admitting: Internal Medicine

## 2020-07-16 DIAGNOSIS — I69391 Dysphagia following cerebral infarction: Secondary | ICD-10-CM | POA: Diagnosis not present

## 2020-07-16 DIAGNOSIS — N390 Urinary tract infection, site not specified: Secondary | ICD-10-CM | POA: Diagnosis present

## 2020-07-16 DIAGNOSIS — E213 Hyperparathyroidism, unspecified: Secondary | ICD-10-CM

## 2020-07-16 DIAGNOSIS — I10 Essential (primary) hypertension: Secondary | ICD-10-CM | POA: Diagnosis present

## 2020-07-16 DIAGNOSIS — E785 Hyperlipidemia, unspecified: Secondary | ICD-10-CM | POA: Diagnosis present

## 2020-07-16 DIAGNOSIS — I69322 Dysarthria following cerebral infarction: Secondary | ICD-10-CM | POA: Diagnosis not present

## 2020-07-16 DIAGNOSIS — Z7189 Other specified counseling: Secondary | ICD-10-CM | POA: Diagnosis not present

## 2020-07-16 DIAGNOSIS — R531 Weakness: Secondary | ICD-10-CM | POA: Diagnosis present

## 2020-07-16 DIAGNOSIS — B952 Enterococcus as the cause of diseases classified elsewhere: Secondary | ICD-10-CM | POA: Diagnosis present

## 2020-07-16 DIAGNOSIS — K219 Gastro-esophageal reflux disease without esophagitis: Secondary | ICD-10-CM | POA: Diagnosis not present

## 2020-07-16 DIAGNOSIS — Z20822 Contact with and (suspected) exposure to covid-19: Secondary | ICD-10-CM | POA: Diagnosis present

## 2020-07-16 DIAGNOSIS — E876 Hypokalemia: Secondary | ICD-10-CM | POA: Diagnosis not present

## 2020-07-16 DIAGNOSIS — Z952 Presence of prosthetic heart valve: Secondary | ICD-10-CM | POA: Diagnosis not present

## 2020-07-16 DIAGNOSIS — E871 Hypo-osmolality and hyponatremia: Secondary | ICD-10-CM

## 2020-07-16 DIAGNOSIS — J449 Chronic obstructive pulmonary disease, unspecified: Secondary | ICD-10-CM | POA: Diagnosis present

## 2020-07-16 DIAGNOSIS — E210 Primary hyperparathyroidism: Secondary | ICD-10-CM | POA: Diagnosis present

## 2020-07-16 DIAGNOSIS — Z90710 Acquired absence of both cervix and uterus: Secondary | ICD-10-CM | POA: Diagnosis not present

## 2020-07-16 DIAGNOSIS — I69354 Hemiplegia and hemiparesis following cerebral infarction affecting left non-dominant side: Secondary | ICD-10-CM | POA: Diagnosis not present

## 2020-07-16 DIAGNOSIS — Z951 Presence of aortocoronary bypass graft: Secondary | ICD-10-CM | POA: Diagnosis not present

## 2020-07-16 DIAGNOSIS — K9423 Gastrostomy malfunction: Secondary | ICD-10-CM | POA: Diagnosis not present

## 2020-07-16 DIAGNOSIS — D638 Anemia in other chronic diseases classified elsewhere: Secondary | ICD-10-CM | POA: Diagnosis present

## 2020-07-16 DIAGNOSIS — R54 Age-related physical debility: Secondary | ICD-10-CM | POA: Diagnosis present

## 2020-07-16 DIAGNOSIS — R791 Abnormal coagulation profile: Secondary | ICD-10-CM | POA: Diagnosis present

## 2020-07-16 DIAGNOSIS — Y848 Other medical procedures as the cause of abnormal reaction of the patient, or of later complication, without mention of misadventure at the time of the procedure: Secondary | ICD-10-CM | POA: Diagnosis present

## 2020-07-16 DIAGNOSIS — I480 Paroxysmal atrial fibrillation: Secondary | ICD-10-CM | POA: Diagnosis present

## 2020-07-16 DIAGNOSIS — Z515 Encounter for palliative care: Secondary | ICD-10-CM | POA: Diagnosis not present

## 2020-07-16 LAB — BASIC METABOLIC PANEL
Anion gap: 10 (ref 5–15)
BUN: 21 mg/dL (ref 8–23)
CO2: 19 mmol/L — ABNORMAL LOW (ref 22–32)
Calcium: 11.9 mg/dL — ABNORMAL HIGH (ref 8.9–10.3)
Chloride: 105 mmol/L (ref 98–111)
Creatinine, Ser: 0.58 mg/dL (ref 0.44–1.00)
GFR, Estimated: >60 mL/min (ref >60)
Glucose, Bld: 81 mg/dL (ref 70–99)
Potassium: 3.5 mmol/L (ref 3.5–5.1)
Sodium: 134 mmol/L — ABNORMAL LOW (ref 135–145)

## 2020-07-16 LAB — COMPREHENSIVE METABOLIC PANEL
ALT: 15 U/L (ref 0–44)
AST: 35 U/L (ref 15–41)
Albumin: 3.2 g/dL — ABNORMAL LOW (ref 3.5–5.0)
Alkaline Phosphatase: 87 U/L (ref 38–126)
Anion gap: 13 (ref 5–15)
BUN: 23 mg/dL (ref 8–23)
CO2: 22 mmol/L (ref 22–32)
Calcium: 13.4 mg/dL (ref 8.9–10.3)
Chloride: 102 mmol/L (ref 98–111)
Creatinine, Ser: 0.65 mg/dL (ref 0.44–1.00)
GFR, Estimated: >60 mL/min (ref >60)
Glucose, Bld: 88 mg/dL (ref 70–99)
Potassium: 3.3 mmol/L — ABNORMAL LOW (ref 3.5–5.1)
Sodium: 137 mmol/L (ref 135–145)
Total Bilirubin: 1.5 mg/dL — ABNORMAL HIGH (ref 0.3–1.2)
Total Protein: 7.2 g/dL (ref 6.5–8.1)

## 2020-07-16 LAB — RESPIRATORY PANEL BY RT PCR (FLU A&B, COVID)
Influenza A by PCR: NEGATIVE
Influenza B by PCR: NEGATIVE
SARS Coronavirus 2 by RT PCR: NEGATIVE

## 2020-07-16 LAB — CBC
HCT: 37.5 % (ref 36.0–46.0)
Hemoglobin: 11 g/dL — ABNORMAL LOW (ref 12.0–15.0)
MCH: 25.8 pg — ABNORMAL LOW (ref 26.0–34.0)
MCHC: 29.3 g/dL — ABNORMAL LOW (ref 30.0–36.0)
MCV: 87.8 fL (ref 80.0–100.0)
Platelets: 263 10*3/uL (ref 150–400)
RBC: 4.27 MIL/uL (ref 3.87–5.11)
RDW: 15.8 % — ABNORMAL HIGH (ref 11.5–15.5)
WBC: 7.4 10*3/uL (ref 4.0–10.5)
nRBC: 0 % (ref 0.0–0.2)

## 2020-07-16 LAB — PHOSPHORUS
Phosphorus: 1.9 mg/dL — ABNORMAL LOW (ref 2.5–4.6)
Phosphorus: 1.7 mg/dL — ABNORMAL LOW (ref 2.5–4.6)

## 2020-07-16 LAB — PROTIME-INR
INR: 5.5 (ref 0.8–1.2)
Prothrombin Time: 48.6 seconds — ABNORMAL HIGH (ref 11.4–15.2)

## 2020-07-16 LAB — MAGNESIUM
Magnesium: 1.4 mg/dL — ABNORMAL LOW (ref 1.7–2.4)
Magnesium: 2.1 mg/dL (ref 1.7–2.4)

## 2020-07-16 MED ORDER — AMIODARONE HCL 200 MG PO TABS
200.0000 mg | ORAL_TABLET | Freq: Every day | ORAL | Status: DC
  Administered 2020-07-16 – 2020-07-24 (×8): 200 mg via ORAL
  Filled 2020-07-16 (×9): qty 1

## 2020-07-16 MED ORDER — POTASSIUM CHLORIDE CRYS ER 20 MEQ PO TBCR
40.0000 meq | EXTENDED_RELEASE_TABLET | Freq: Once | ORAL | Status: AC
  Administered 2020-07-16: 40 meq via ORAL
  Filled 2020-07-16: qty 2

## 2020-07-16 MED ORDER — WARFARIN - PHARMACIST DOSING INPATIENT: Freq: Every day | Status: DC

## 2020-07-16 MED ORDER — AMLODIPINE BESYLATE 5 MG PO TABS
5.0000 mg | ORAL_TABLET | Freq: Every day | ORAL | Status: DC
  Administered 2020-07-16 – 2020-07-17 (×2): 5 mg via ORAL
  Filled 2020-07-16 (×2): qty 1

## 2020-07-16 MED ORDER — CARVEDILOL 12.5 MG PO TABS
12.5000 mg | ORAL_TABLET | Freq: Two times a day (BID) | ORAL | Status: DC
  Administered 2020-07-16 – 2020-07-17 (×4): 12.5 mg via ORAL
  Filled 2020-07-16 (×6): qty 1

## 2020-07-16 MED ORDER — SODIUM CHLORIDE 0.9 % IV SOLN: INTRAVENOUS | Status: DC

## 2020-07-16 MED ORDER — PANTOPRAZOLE SODIUM 40 MG PO TBEC
40.0000 mg | DELAYED_RELEASE_TABLET | Freq: Every day | ORAL | Status: DC
  Administered 2020-07-16 – 2020-07-24 (×8): 40 mg via ORAL
  Filled 2020-07-16 (×9): qty 1

## 2020-07-16 MED ORDER — ONDANSETRON HCL 4 MG/2ML IJ SOLN
4.0000 mg | Freq: Four times a day (QID) | INTRAMUSCULAR | Status: DC | PRN
  Administered 2020-07-16 – 2020-07-23 (×5): 4 mg via INTRAVENOUS
  Filled 2020-07-16 (×5): qty 2

## 2020-07-16 MED ORDER — ATORVASTATIN CALCIUM 40 MG PO TABS
40.0000 mg | ORAL_TABLET | Freq: Every day | ORAL | Status: DC
  Administered 2020-07-16 – 2020-07-17 (×2): 40 mg via ORAL
  Filled 2020-07-16 (×2): qty 1

## 2020-07-16 MED ORDER — ACETAMINOPHEN 325 MG PO TABS
650.0000 mg | ORAL_TABLET | Freq: Four times a day (QID) | ORAL | Status: DC | PRN
  Administered 2020-07-17 – 2020-07-20 (×3): 650 mg via ORAL
  Filled 2020-07-16 (×4): qty 2

## 2020-07-16 MED ORDER — K PHOS MONO-SOD PHOS DI & MONO 155-852-130 MG PO TABS
500.0000 mg | ORAL_TABLET | Freq: Four times a day (QID) | ORAL | Status: DC
  Administered 2020-07-16 (×2): 500 mg via ORAL
  Filled 2020-07-16 (×3): qty 2

## 2020-07-16 MED ORDER — ASPIRIN EC 81 MG PO TBEC
81.0000 mg | DELAYED_RELEASE_TABLET | Freq: Every day | ORAL | Status: DC
  Administered 2020-07-16 – 2020-07-24 (×8): 81 mg via ORAL
  Filled 2020-07-16 (×9): qty 1

## 2020-07-16 MED ORDER — TRAMADOL HCL 50 MG PO TABS
50.0000 mg | ORAL_TABLET | Freq: Four times a day (QID) | ORAL | Status: DC | PRN
  Administered 2020-07-16 – 2020-07-24 (×10): 50 mg via ORAL
  Filled 2020-07-16 (×10): qty 1

## 2020-07-16 MED ORDER — ACETAMINOPHEN 650 MG RE SUPP: 650.0000 mg | Freq: Four times a day (QID) | RECTAL | Status: DC | PRN

## 2020-07-16 MED ORDER — MIRTAZAPINE 30 MG PO TABS
30.0000 mg | ORAL_TABLET | Freq: Every day | ORAL | Status: DC
  Administered 2020-07-16 – 2020-07-23 (×6): 30 mg via ORAL
  Filled 2020-07-16 (×6): qty 1

## 2020-07-16 MED ORDER — WARFARIN SODIUM 5 MG PO TABS: 6.0000 mg | ORAL_TABLET | Freq: Every day | ORAL | Status: DC

## 2020-07-16 MED ORDER — MAGNESIUM SULFATE 2 GM/50ML IV SOLN
2.0000 g | Freq: Once | INTRAVENOUS | Status: AC
  Administered 2020-07-16: 2 g via INTRAVENOUS
  Filled 2020-07-16: qty 50

## 2020-07-16 MED ORDER — FUROSEMIDE 10 MG/ML IJ SOLN
20.0000 mg | Freq: Four times a day (QID) | INTRAMUSCULAR | Status: DC
  Administered 2020-07-16 – 2020-07-17 (×6): 20 mg via INTRAVENOUS
  Filled 2020-07-16 (×6): qty 2

## 2020-07-16 MED ORDER — ONDANSETRON HCL 4 MG PO TABS
4.0000 mg | ORAL_TABLET | Freq: Four times a day (QID) | ORAL | Status: DC | PRN
  Administered 2020-07-18: 4 mg via ORAL
  Filled 2020-07-16: qty 1

## 2020-07-16 MED ORDER — ZOLEDRONIC ACID 4 MG/5ML IV CONC
4.0000 mg | Freq: Once | INTRAVENOUS | Status: AC
  Administered 2020-07-16: 4 mg via INTRAVENOUS
  Filled 2020-07-16: qty 5

## 2020-07-16 MED ORDER — K PHOS MONO-SOD PHOS DI & MONO 155-852-130 MG PO TABS
500.0000 mg | ORAL_TABLET | Freq: Four times a day (QID) | ORAL | Status: AC
  Administered 2020-07-16 – 2020-07-17 (×2): 500 mg via ORAL
  Filled 2020-07-16 (×2): qty 2

## 2020-07-16 MED ORDER — POTASSIUM CHLORIDE IN NACL 20-0.9 MEQ/L-% IV SOLN: INTRAVENOUS | Status: DC

## 2020-07-16 MED ORDER — CINACALCET HCL 30 MG PO TABS
90.0000 mg | ORAL_TABLET | Freq: Every day | ORAL | Status: DC
  Administered 2020-07-16 – 2020-07-24 (×8): 90 mg via ORAL
  Filled 2020-07-16 (×9): qty 3

## 2020-07-16 NOTE — Progress Notes (Signed)
CRITICAL VALUE ALERT  Critical Value:  Calcium 13.4  Date & Time Notied:  07/16/2020 @ 0730  Provider Notified: Caleb Popp  Orders Received/Actions taken: See orders

## 2020-07-16 NOTE — Progress Notes (Signed)
ANTICOAGULATION CONSULT NOTE - Initial Consult  Pharmacy Consult for Coumadin Indication: MVR and Afib  No Known Allergies  Patient Measurements: Height: 5' (152.4 cm) Weight: 71.1 kg (156 lb 12 oz) IBW/kg (Calculated) : 45.5  Vital Signs: Temp: 98.3 F (36.8 C) (11/02 0347) Temp Source: Oral (11/01 1925) BP: 114/53 (11/02 0347) Pulse Rate: 98 (11/02 0347)  Labs: Recent Labs    07/15/20 2107  HGB 11.3*  HCT 37.5  PLT 343  CREATININE 0.73    Estimated Creatinine Clearance: 60.8 mL/min (by C-G formula based on SCr of 0.73 mg/dL).   Medical History: Past Medical History:  Diagnosis Date   COPD (chronic obstructive pulmonary disease) (HCC) 04/23/2020   Gastrojejunostomy tube status (HCC) 05/31/2020   GERD (gastroesophageal reflux disease) 07/15/2020   High cholesterol    Hypercalcemia 07/15/2020   Hyperparathyroidism (HCC) 05/31/2020   Hypertension    Left hemiplegia (HCC) 04/29/2020   Non-rheumatic mitral valve stenosis 12/09/2017   Paroxysmal A-fib (HCC) 04/23/2020   S/P mitral valve replacement with metallic valve 04/29/2020   Formatting of this note might be different from the original. [employer] regent mechanical  Procedures:   1. Mitral valve replacement (25 mm [employer]ent) (CPT [phone]) 2. Tricuspid valve repair (annuloplasty only)  (30  mm CE Physio ring) (CPT 33464) 4. 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 Procedures:   1. Mitral valve replacement (25 mm [employer]ent) (CPT [phone]) 2. Tricuspid valve repair (annuloplasty only)  (30  mm CE Physio ring) (CPT 33464) 4. Reoperation on heart/redo-sternotomy (s/p MVR 2005)    Medications:  Medications Prior to Admission  Medication Sig Dispense Refill Last Dose   acetaminophen (TYLENOL) 325 MG tablet Take by mouth.   07/15/2020 at Unknown time   amiodarone (PACERONE) 200 MG tablet Take  by mouth.   07/15/2020 at Unknown time   amLODipine (NORVASC) 5 MG tablet Take 5 mg by mouth daily.   07/14/2020 at Unknown time   aspirin 81 MG EC tablet Take by mouth.   07/15/2020 at Unknown time   atorvastatin (LIPITOR) 40 MG tablet Take by mouth.   07/15/2020 at Unknown time   carvedilol (COREG) 12.5 MG tablet Take by mouth.   07/15/2020 at 1800   Cholecalciferol 50 MCG (2000 UT) TABS Take by mouth.   07/15/2020 at Unknown time   cinacalcet (SENSIPAR) 90 MG tablet Take by mouth.   unknown   cyclobenzaprine (FLEXERIL) 5 MG tablet Take 1 tablet (5 mg total) by mouth 3 (three) times daily as needed. For muscle spasms.  May cause drowsiness 15 tablet 0 07/15/2020 at Unknown time   diclofenac Sodium (VOLTAREN) 1 % GEL Apply topically.   unknown   furosemide (LASIX) 40 MG tablet Take by mouth.   07/15/2020 at Unknown time   gabapentin (NEURONTIN) 400 MG capsule Take by mouth.   07/15/2020 at Unknown time   mirtazapine (REMERON) 30 MG tablet Take 30 mg by mouth at bedtime.   07/14/2020 at Unknown time   nystatin (MYCOSTATIN/NYSTOP) powder Apply topically.      omeprazole (PRILOSEC) 20 MG capsule Take 20 mg by mouth daily.   07/15/2020 at Unknown time   ondansetron (ZOFRAN-ODT) 4 MG disintegrating tablet Take by mouth.   unknown   pantoprazole (PROTONIX) 40 MG tablet Take by mouth.   07/15/2020 at Unknown time   traMADol (ULTRAM) 50 MG tablet Take  1 tablet (50 mg total) by mouth every 6 (six) hours as needed. 12 tablet 0 07/15/2020 at Unknown time   traZODone (DESYREL) 50 MG tablet Take by mouth.   07/14/2020 at Unknown time   warfarin (COUMADIN) 2 MG tablet Take by mouth.   07/14/2020 at 0300   ipratropium-albuterol (DUONEB) 0.5-2.5 (3) MG/3ML SOLN Inhale into the lungs. (Patient not taking: Reported on 07/15/2020)   Not Taking at Unknown time   Scheduled:   amiodarone  200 mg Oral Daily   amLODipine  5 mg Oral Daily   aspirin EC  81 mg Oral Daily   atorvastatin  40 mg Oral Daily    carvedilol  12.5 mg Oral BID WC   cinacalcet  90 mg Oral Q breakfast   furosemide  20 mg Intravenous Q6H   mirtazapine  30 mg Oral QHS   pantoprazole  40 mg Oral Daily   Infusions:   sodium chloride Stopped (07/15/20 2348)   0.9 % NaCl with KCl 20 mEq / L     magnesium sulfate bolus IVPB     zoledronic acid (ZOMETA) IV      Assessment: 64yo female c/o weakness and nausea x1wk, admitted for hypercalcemia, to continue Coumadin for MVR/Afib.  Goal of Therapy:  INR 2.5-3.5   Plan:  Continue Coumadin 6mg  daily and monitor INR for dose adjustments.  , PharmD, BCPS  07/16/2020,4:11 AM

## 2020-07-16 NOTE — Progress Notes (Signed)
PROGRESS NOTE    Annette Rogers  DJS:[id number] DOB: 05-09-1956 DOA: 07/15/2020 PCP: Patient, No Pcp Per   Brief Narrative: Annette Rogers is a 64 y.o. female with medical history significant of COPD, gastrojejunostomy, GERD, hyperlipidemia, hypercalcemia, hyperparathyroidism, hypertension, history of CVA with left-sided hemiplegia, nonrheumatic mitral valve stenosis with metallic valve replacement, history of tricuspid valve repair. Patient presented secondary to weakness and nausea and found to have significant hypercalcemia in relation to known primary hyperparathyroidism.   Assessment & Plan:   Principal Problem:   Hypercalcemia Active Problems:   Hypertension   COPD (chronic obstructive pulmonary disease) (HCC)   GERD (gastroesophageal reflux disease)   Hyperparathyroidism (HCC)   Left hemiplegia (HCC)   Non-rheumatic mitral valve stenosis   Paroxysmal A-fib (HCC)   S/P mitral valve replacement with metallic valve   S/P TVR (tricuspid valve repair)   Hypokalemia   Hyponatremia   Normocytic anemia   Hypomagnesemia   Hypophosphatemia   Hypercalcemia Associated weakness. Secondary to below. Received Zometa and started on high rate IV fluids -Continue IV fluids @ 200 mL/hr, Lasix -Daily BMP -Telemetry  Primary hyperparathyroidism Diagnosed recently. Associated hypercalcemia and hypophosphatemia. Patient evaluated for surgery with recommendations for no surgery at that time. Patient is on Vitamin D and cinacalcet. Does not appear to have known bone disease. -Continue cinacalcet  Hypophosphatemia Secondary to above -Continue K phos -Recheck phosphorus in AM  Paroxysmal atrial fibrillation -Continue amiodarone and Coreg  Dysarthria Sequela from stroke. Stable.  Dysphagia Secondary to history of stroke. Patient had feeding tube placed but has only been using it for medications recently. When last evaluated, she was placed on a dysphagia 2 diet. -Dysphagia 2  diet -Dietitian consult -SLP evaluation  COPD Stable.  Primary hypertension -Continue amlodipine and Coreg  GERD -Continue Protonix  Hypomagnesemia Resolved.  Left hemiplegia Chronic and stable.  History of mitral valve stenosis s/p MVR with mechanical mitral valve History of tricuspid valve repair Currently on coumadin. Goal INR of 2.5-3.5 on chart review. INR supratherapeutic today at 5.5 but no obvious bleed. -Coumadin per pharmacy  Anemia Mild. Normocytic.  Hyperlipidemia -Continue Lipitor   DVT prophylaxis: Coumadin Code Status:   Code Status: Full Code Family Communication: None at bedside. Daughter on telephone (6 minutes) Disposition Plan: Discharge likely home (declined SNF on last hospitalization) pending improvement of hypercalcemia. Anticipate 2-4 days at least   Consultants:   None  Procedures:   None  Antimicrobials:  None    Subjective: No concerns  Objective: Vitals:   07/15/20 2330 07/16/20 0230 07/16/20 0347 07/16/20 0752  BP: 112/70 (!) 106/59 (!) 114/53 139/69  Pulse: 98 95 98 90  Resp: 16 (!) 23 18   Temp:   98.3 F (36.8 C) 97.7 F (36.5 C)  TempSrc:    Oral  SpO2: 98% 99% 98% 100%  Weight:   71.1 kg   Height:   5' (1.524 m)     Intake/Output Summary (Last 24 hours) at 07/16/2020 0821 Last data filed at 07/16/2020 0600 Gross per 24 hour  Intake 856 ml  Output --  Net 856 ml   Filed Weights   07/15/20 1922 07/16/20 0347  Weight: 71.7 kg 71.1 kg    Examination:  General exam: Appears calm and comfortable Respiratory system: Clear to auscultation. Respiratory effort normal. Cardiovascular system: S1 & S2 heard, RRR. No murmur. Mechanical click. Gastrointestinal system: Abdomen is nondistended, soft and nontender. No organomegaly or masses felt. Normal bowel sounds heard. Central nervous system: Alert and  oriented. Dysarthria. Musculoskeletal: No calf tenderness Skin: No cyanosis. No rashes Psychiatry: Judgement  and insight appear normal. Mood & affect appropriate.     Data Reviewed: I have personally reviewed following labs and imaging studies  CBC Lab Results  Component Value Date   WBC 7.4 07/16/2020   RBC 4.27 07/16/2020   HGB 11.0 (L) 07/16/2020   HCT 37.5 07/16/2020   MCV 87.8 07/16/2020   MCH 25.8 (L) 07/16/2020   PLT 263 07/16/2020   MCHC 29.3 (L) 07/16/2020   RDW 15.8 (H) 07/16/2020   LYMPHSABS 2.3 07/15/2020   MONOABS 0.6 07/15/2020   EOSABS 0.1 07/15/2020   BASOSABS 0.0 07/15/2020     Last metabolic panel Lab Results  Component Value Date   NA 137 07/16/2020   K 3.3 (L) 07/16/2020   CL 102 07/16/2020   CO2 22 07/16/2020   BUN 23 07/16/2020   CREATININE 0.65 07/16/2020   GLUCOSE 88 07/16/2020   GFRNONAA >60 07/16/2020   CALCIUM 13.4 (HH) 07/16/2020   PHOS 1.9 (L) 07/15/2020   PROT 7.2 07/16/2020   ALBUMIN 3.2 (L) 07/16/2020   BILITOT 1.5 (H) 07/16/2020   ALKPHOS 87 07/16/2020   AST 35 07/16/2020   ALT 15 07/16/2020   ANIONGAP 13 07/16/2020    CBG (last 3)  No results for input(s): GLUCAP in the last 72 hours.   GFR: Estimated Creatinine Clearance: 60.8 mL/min (by C-G formula based on SCr of 0.65 mg/dL).  Coagulation Profile: No results for input(s): INR, PROTIME in the last 168 hours.  Recent Results (from the past 240 hour(s))  Respiratory Panel by RT PCR (Flu A&B, Covid) - Urine, Clean Catch     Status: None   Collection Time: 07/15/20 11:18 PM   Specimen: Urine, Clean Catch; Nasopharyngeal  Result Value Ref Range Status   SARS Coronavirus 2 by RT PCR NEGATIVE NEGATIVE Final    Comment: (NOTE) SARS-CoV-2 target nucleic acids are NOT DETECTED.  The SARS-CoV-2 RNA is generally detectable in upper respiratoy specimens during the acute phase of infection. The lowest concentration of SARS-CoV-2 viral copies this assay can detect is 131 copies/mL. A negative result does not preclude SARS-Cov-2 infection and should not be used as the sole basis for  treatment or other patient management decisions. A negative result may occur with  improper specimen collection/handling, submission of specimen other than nasopharyngeal swab, presence of viral mutation(s) within the areas targeted by this assay, and inadequate number of viral copies (<131 copies/mL). A negative result must be combined with clinical observations, patient history, and epidemiological information. The expected result is Negative.  Fact Sheet for Patients:  https://www.moore.com/  Fact Sheet for Healthcare Providers:  https://www.young.biz/  This test is no t yet approved or cleared by the [place] FDA and  has been authorized for detection and/or diagnosis of SARS-CoV-2 by FDA under an Emergency Use Authorization (EUA). This EUA will remain  in effect (meaning this test can be used) for the duration of the COVID-19 declaration under Section 564(b)(1) of the Act, 21 U.S.C. section 360bbb-3(b)(1), unless the authorization is terminated or revoked sooner.     Influenza A by PCR NEGATIVE NEGATIVE Final   Influenza B by PCR NEGATIVE NEGATIVE Final    Comment: (NOTE) The Xpert Xpress SARS-CoV-2/FLU/RSV assay is intended as an aid in  the diagnosis of influenza from Nasopharyngeal swab specimens and  should not be used as a sole basis for treatment. Nasal washings and  aspirates are unacceptable for Xpert Xpress  SARS-CoV-2/FLU/RSV  testing.  Fact Sheet for Patients: https://www.moore.com/  Fact Sheet for Healthcare Providers: https://www.young.biz/  This test is not yet approved or cleared by the [place] FDA and  has been authorized for detection and/or diagnosis of SARS-CoV-2 by  FDA under an Emergency Use Authorization (EUA). This EUA will remain  in effect (meaning this test can be used) for the duration of the  Covid-19 declaration under Section 564(b)(1) of the Act, 21    U.S.C. section 360bbb-3(b)(1), unless the authorization is  terminated or revoked. Performed at [facility], [address] [place], [place] [address]         Radiology Studies: No results found.      Scheduled Meds: . amiodarone  200 mg Oral Daily  . amLODipine  5 mg Oral Daily  . aspirin EC  81 mg Oral Daily  . atorvastatin  40 mg Oral Daily  . carvedilol  12.5 mg Oral BID WC  . cinacalcet  90 mg Oral Q breakfast  . furosemide  20 mg Intravenous Q6H  . mirtazapine  30 mg Oral QHS  . pantoprazole  40 mg Oral Daily  . phosphorus  500 mg Oral QID  . potassium chloride  40 mEq Oral Once  . warfarin  6 mg Oral q1600  . Warfarin - Pharmacist Dosing Inpatient   Does not apply q1600   Continuous Infusions: . sodium chloride       LOS: 0 days     Jacquelin Hawking, MD [facility] 07/16/2020, 8:21 AM  If 7PM-7AM, please contact night-coverage [email or web address]

## 2020-07-16 NOTE — TOC Initial Note (Addendum)
Transition of Care [facility]) - Initial/Assessment Note    Patient Details  Name: Annette Rogers MRN: [id number] Date of Birth: Oct 31, 1955  [facility]) CM/SW Contact:    Karn Cassis, LCSW Phone Number: 07/16/2020, 9:51 AM  Clinical Narrative:  Pt admitted due to hypercalcemia and hyperparathyroidism. LCSW spoke briefly with pt who requested that assessment be completed with her daughter, Annette Rogers. Pt provided number to reach [facility] [phone]). Annette Rogers states that pt has around the clock care provided by pt's boyfriend and her niece in her home. Pt's sister also helps as needed. Pt had a stroke on 8/13 at [facility]. She went to inpatient rehab for several weeks and d/c home on 10/11. Pt and daughter said home health was set up and she is receiving PT, OT, speech, and RN services. However, they are unsure what agency it is through. LCSW determined pt is active with [facility]. Cory at [facility] notified. Annette Rogers indicates they have all DME already. Pt has been non-ambulatory since stroke and requires a lift to transfer. She has a feeding tube. Pt's PCP is at [facility]. Monica plans on pt returning home when medically stable. TOC will continue to follow.                     Expected Discharge Plan: Home w Home Health Services Barriers to Discharge: Continued Medical Work up   Patient Goals and CMS Choice Patient states their goals for this hospitalization and ongoing recovery are:: return home   Choice offered to / list presented to : Patient, Adult Children  Expected Discharge Plan and Services Expected Discharge Plan: Home w Home Health Services In-house Referral: Clinical Social Work   Post Acute Care Choice: Home Health Living arrangements for the past 2 months: Single Family Home                                      Prior Living Arrangements/Services Living arrangements for the past 2 months: Single Family Home Lives with:: Relatives, Significant  Other Patient language and need for interpreter reviewed:: Yes Do you feel safe going back to the place where you live?: Yes      Need for Family Participation in Patient Care: Yes (Comment) Care giver support system in place?: Yes (comment) Current home services: DME, Home PT, Home OT, Home RN (wheelchair, lift, hospital bed, potty chair) Criminal Activity/Legal Involvement Pertinent to Current Situation/Hospitalization: No - Comment as needed  Activities of Daily Living Home Assistive Devices/Equipment: Wheelchair, Hospital bed ADL Screening (condition at time of admission) Patient's cognitive ability adequate to safely complete daily activities?: Yes Is the patient deaf or have difficulty hearing?: No Does the patient have difficulty seeing, even when wearing glasses/contacts?: No Does the patient have difficulty concentrating, remembering, or making decisions?: No Patient able to express need for assistance with ADLs?: Yes Does the patient have difficulty dressing or bathing?: Yes Independently performs ADLs?: No Communication: Independent Dressing (OT): Dependent Is this a change from baseline?: Pre-admission baseline Grooming: Dependent Is this a change from baseline?: Pre-admission baseline Feeding: Independent Bathing: Dependent Is this a change from baseline?: Pre-admission baseline Toileting: Dependent Is this a change from baseline?: Pre-admission baseline In/Out Bed: Dependent Is this a change from baseline?: Pre-admission baseline Walks in Home: Dependent Is this a change from baseline?: Pre-admission baseline Does the patient have difficulty walking or climbing stairs?: Yes Weakness of Legs: Both  Weakness of Arms/Hands: Left  Permission Sought/Granted                  Emotional Assessment       Orientation: : Oriented to Self, Oriented to Place, Oriented to  Time, Oriented to Situation Alcohol / Substance Use: Not Applicable Psych Involvement: No  (comment)  Admission diagnosis:  Hypercalcemia [E83.52] Pain of left upper extremity [M79.602] Patient Active Problem List   Diagnosis Date Noted  . Hypomagnesemia 07/16/2020  . Hypophosphatemia 07/16/2020  . Hypercalcemia 07/15/2020  . GERD (gastroesophageal reflux disease) 07/15/2020  . Gastric ulcer 07/15/2020  . Hypertension   . Hypokalemia   . Hyponatremia   . Normocytic anemia   . Hyperparathyroidism (HCC) 05/31/2020  . Gastrojejunostomy tube status (HCC) 05/31/2020  . Warfarin anticoagulation 05/15/2020  . Thrombocytosis 05/06/2020  . Left hemiplegia (HCC) 04/29/2020  . S/P mitral valve replacement with metallic valve 04/29/2020  . 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 COPD (chronic obstructive pulmonary disease) (HCC) 04/23/2020  . Paroxysmal A-fib (HCC) 04/23/2020  . Pulmonary hypertension (HCC) 04/23/2020  . Non-rheumatic mitral valve stenosis 12/09/2017   PCP:  Patient, No Pcp Per Pharmacy:   [facility], [place] - [place], [place] - [address] [place] [address] Phone: [phone] Fax: [phone]     Social Determinants of Health (SDOH) Interventions    Readmission Risk Interventions No flowsheet data found.

## 2020-07-16 NOTE — Progress Notes (Signed)
ANTICOAGULATION CONSULT NOTE -   Pharmacy Consult for Coumadin Indication: MVR and Afib  No Known Allergies  Patient Measurements: Height: 5' (152.4 cm) Weight: 71.1 kg (156 lb 12 oz) IBW/kg (Calculated) : 45.5  Vital Signs: Temp: 97.7 F (36.5 C) (11/02 0752) Temp Source: Oral (11/02 0752) BP: 139/69 (11/02 0752) Pulse Rate: 90 (11/02 0752)  Labs: Recent Labs    07/15/20 2107 07/16/20 0611  HGB 11.3* 11.0*  HCT 37.5 37.5  PLT 343 263  LABPROT  --  48.6*  INR  --  5.5*  CREATININE 0.73 0.65    Estimated Creatinine Clearance: 60.8 mL/min (by C-G formula based on SCr of 0.65 mg/dL).   Medical History: Past Medical History:  Diagnosis Date  . COPD (chronic obstructive pulmonary disease) (HCC) 04/23/2020  . Gastrojejunostomy tube status (HCC) 05/31/2020  . GERD (gastroesophageal reflux disease) 07/15/2020  . High cholesterol   . Hypercalcemia 07/15/2020  . Hyperparathyroidism (HCC) 05/31/2020  . Hypertension   . Left hemiplegia (HCC) 04/29/2020  . Non-rheumatic mitral valve stenosis 12/09/2017  . Paroxysmal A-fib (HCC) 04/23/2020  . S/P mitral valve replacement with metallic valve 04/29/2020   Formatting of this note might be different from the original. St. Jude regent mechanical  Procedures:   1. Mitral valve replacement (25 mm [employer] Jude Regent) (CPT [phone]) 2. Tricuspid valve repair (annuloplasty only)  (30  mm CE Physio ring) (CPT 33464) 4. 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 Procedures:   1. Mitral valve replacement (25 mm [employer] Jude Regent) (CPT [phone]) 2. Tricuspid valve repair (annuloplasty only)  (30  mm CE Physio ring) (CPT 33464) 4. Reoperation on heart/redo-sternotomy (s/p MVR 2005)    Medications:  Medications Prior to Admission  Medication Sig Dispense Refill Last Dose  . acetaminophen (TYLENOL) 325 MG tablet Take by mouth.   07/15/2020 at  Unknown time  . amiodarone (PACERONE) 200 MG tablet Take by mouth.   07/15/2020 at Unknown time  . amLODipine (NORVASC) 5 MG tablet Take 5 mg by mouth daily.   07/14/2020 at Unknown time  . aspirin 81 MG EC tablet Take by mouth.   07/15/2020 at Unknown time  . atorvastatin (LIPITOR) 40 MG tablet Take by mouth.   07/15/2020 at Unknown time  . carvedilol (COREG) 12.5 MG tablet Take by mouth.   07/15/2020 at 1800  . Cholecalciferol 50 MCG (2000 UT) TABS Take by mouth.   07/15/2020 at Unknown time  . cinacalcet (SENSIPAR) 90 MG tablet Take by mouth.   unknown  . cyclobenzaprine (FLEXERIL) 5 MG tablet Take 1 tablet (5 mg total) by mouth 3 (three) times daily as needed. For muscle spasms.  May cause drowsiness 15 tablet 0 07/15/2020 at Unknown time  . diclofenac Sodium (VOLTAREN) 1 % GEL Apply topically.   unknown  . furosemide (LASIX) 40 MG tablet Take by mouth.   07/15/2020 at Unknown time  . gabapentin (NEURONTIN) 400 MG capsule Take by mouth.   07/15/2020 at Unknown time  . mirtazapine (REMERON) 30 MG tablet Take 30 mg by mouth at bedtime.   07/14/2020 at Unknown time  . nystatin (MYCOSTATIN/NYSTOP) powder Apply topically.     Marland Kitchen omeprazole (PRILOSEC) 20 MG capsule Take 20 mg by mouth daily.   07/15/2020 at Unknown time  . ondansetron (ZOFRAN-ODT) 4 MG disintegrating tablet Take by mouth.   unknown  . pantoprazole (PROTONIX) 40 MG tablet  Take by mouth.   07/15/2020 at Unknown time  . traMADol (ULTRAM) 50 MG tablet Take 1 tablet (50 mg total) by mouth every 6 (six) hours as needed. 12 tablet 0 07/15/2020 at Unknown time  . traZODone (DESYREL) 50 MG tablet Take by mouth.   07/14/2020 at Unknown time  . warfarin (COUMADIN) 2 MG tablet Take by mouth.   07/14/2020 at 0300  . ipratropium-albuterol (DUONEB) 0.5-2.5 (3) MG/3ML SOLN Inhale into the lungs. (Patient not taking: Reported on 07/15/2020)   Not Taking at Unknown time   Scheduled:  . amiodarone  200 mg Oral Daily  . amLODipine  5 mg Oral Daily  . aspirin  EC  81 mg Oral Daily  . atorvastatin  40 mg Oral Daily  . carvedilol  12.5 mg Oral BID WC  . cinacalcet  90 mg Oral Q breakfast  . furosemide  20 mg Intravenous Q6H  . mirtazapine  30 mg Oral QHS  . pantoprazole  40 mg Oral Daily  . phosphorus  500 mg Oral QID  . potassium chloride  40 mEq Oral Once  . warfarin  6 mg Oral q1600  . Warfarin - Pharmacist Dosing Inpatient   Does not apply q1600   Infusions:  . sodium chloride      Assessment: 64yo female c/o weakness and nausea x1wk, admitted for hypercalcemia, to continue Coumadin for MVR/Afib. INR is elevated at 5.5  Goal of Therapy:  INR 2.5-3.5   Plan:  No coumadin today Monitor INR for dose adjustments. Monitor for S/S of bleeding  Elder Cyphers, BS Loura Back, BCPS Clinical Pharmacist Pager [phone] 07/16/2020,8:58 AM

## 2020-07-16 NOTE — Care Management Obs Status (Signed)
MEDICARE OBSERVATION STATUS NOTIFICATION   Patient Details  Name: Annette Rogers MRN: [id number] Date of Birth: 08-08-1956   Medicare Observation Status Notification Given:  Yes    Corey Harold 07/16/2020, 4:04 PM

## 2020-07-16 NOTE — Evaluation (Signed)
Clinical/Bedside Swallow Evaluation Patient Details  Name: Annette Rogers MRN: [id number] Date of Birth: [birth year]  Today's Date: 07/16/2020 Time: SLP Start Time (ACUTE ONLY): 1630 SLP Stop Time (ACUTE ONLY): 1656 SLP Time Calculation (min) (ACUTE ONLY): 26 min  Past Medical History:  Past Medical History:  Diagnosis Date  . COPD (chronic obstructive pulmonary disease) (HCC) 04/23/2020  . Gastrojejunostomy tube status (HCC) 05/31/2020  . GERD (gastroesophageal reflux disease) 07/15/2020  . High cholesterol   . Hypercalcemia 07/15/2020  . Hyperparathyroidism (HCC) 05/31/2020  . Hypertension   . Left hemiplegia (HCC) 04/29/2020  . Non-rheumatic mitral valve stenosis 12/09/2017  . Paroxysmal A-fib (HCC) 04/23/2020  . S/P mitral valve replacement with metallic valve 04/29/2020   Formatting of this note might be different from the original. St. Jude regent mechanical  Procedures:   1. Mitral valve replacement (25 mm [employer] Jude Regent) (CPT [phone]) 2. Tricuspid valve repair (annuloplasty only)  (30  mm CE Physio ring) (CPT 33464) 4. 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 Procedures:   1. Mitral valve replacement (25 mm [employer]. Jude Regent) (CPT [phone]) 2. Tricuspid valve repair (annuloplasty only)  (30  mm CE Physio ring) (CPT 33464) 4. 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 ABDOMINAL HYSTERECTOMY    . CORONARY ARTERY BYPASS GRAFT    . GASTROSTOMY W/ FEEDING TUBE    . JOINT REPLACEMENT     HPI:  Annette Rogers is a 64 y.o. female with medical history significant of COPD, gastrojejunostomy, GERD, hyperlipidemia, hypercalcemia, hyperparathyroidism, hypertension, history of CVA with left-sided hemiplegia, nonrheumatic mitral valve stenosis with metallic valve replacement, history of tricuspid valve  repair. Patient presented secondary to weakness and nausea and found to have significant hypercalcemia in relation to known primary hyperparathyroidism.   Assessment / Plan / Recommendation Clinical Impression  Clinical swallow evaluation completed at bedside. Pt presents with left facial asymmetry and weakness and dysarthric speech. She states that she eats by mouth at home, but still has her feeding tube. Her lunch tray was in her room untouched due to Pt with nausea. Pt agreeable to sips of water and small bite of pudding, however refused additional due to nausea. Pt without overt signs or symptoms of aspiration, however does present with oral phase deficits due to edentulous status and reduced labial closure on the left. OK to continue diet as previously ordered (D2/thin) and SLP will follow during acute stay.   SLP Visit Diagnosis: Dysphagia, unspecified (R13.10)    Aspiration Risk  Mild aspiration risk    Diet Recommendation Dysphagia 2 (Fine chop);Thin liquid   Liquid Administration via: Cup;Straw Medication Administration: Whole meds with puree Supervision: Patient able to self feed;Intermittent supervision to cue for compensatory strategies Compensations: Slow rate;Small sips/bites;Follow solids with liquid Postural Changes: Seated upright at 90 degrees;Remain upright for at least 30 minutes after po intake    Other  Recommendations Oral Care Recommendations: Oral care BID;Staff/trained caregiver to provide oral care Other Recommendations: Clarify dietary restrictions   Follow up Recommendations Skilled Nursing facility;24 hour supervision/assistance      Frequency and Duration min 2x/week  1 week       Prognosis Prognosis for Safe Diet Advancement: Fair Barriers to Reach Goals: Severity of deficits      Swallow Study   General Date of Onset: 07/15/20 HPI:  Annette Rogers is a 64 y.o. female with medical history significant of COPD, gastrojejunostomy, GERD, hyperlipidemia,  hypercalcemia, hyperparathyroidism, hypertension, history of CVA with left-sided hemiplegia, nonrheumatic mitral valve stenosis with metallic valve replacement, history of tricuspid valve repair. Patient presented secondary to weakness and nausea and found to have significant hypercalcemia in relation to known primary hyperparathyroidism. Type of Study: Bedside Swallow Evaluation Diet Prior to this Study: Dysphagia 2 (chopped);Thin liquids Temperature Spikes Noted: No Respiratory Status: Room air History of Recent Intubation: No Behavior/Cognition: Alert;Cooperative;Pleasant mood Oral Cavity Assessment: Within Functional Limits Oral Care Completed by SLP: No Oral Cavity - Dentition: Edentulous Vision: Functional for self-feeding Self-Feeding Abilities: Able to feed self Patient Positioning: Upright in bed Baseline Vocal Quality: Normal Volitional Cough: Strong Volitional Swallow: Able to elicit    Oral/Motor/Sensory Function Overall Oral Motor/Sensory Function: Moderate impairment Facial ROM: Reduced left;Suspected CN VII (facial) dysfunction Facial Symmetry: Abnormal symmetry left;Suspected CN VII (facial) dysfunction Facial Strength: Suspected CN VII (facial) dysfunction;Reduced left Lingual ROM: Within Functional Limits Lingual Symmetry: Within Functional Limits Lingual Strength: Within Functional Limits Mandible: Within Functional Limits   Ice Chips Ice chips: Within functional limits Presentation: Spoon   Thin Liquid Thin Liquid: Within functional limits Presentation: Self Fed;Straw    Nectar Thick Nectar Thick Liquid: Not tested   Honey Thick Honey Thick Liquid: Not tested   Puree Puree: Within functional limits Presentation: Spoon   Solid     Solid: Not tested Presentation:  (Pt refused)     Thank you,  Havery Moros, CCC-SLP [phone]  Annette Rogers 07/16/2020,6:49 PM

## 2020-07-16 NOTE — Plan of Care (Signed)

## 2020-07-17 ENCOUNTER — Inpatient Hospital Stay (HOSPITAL_COMMUNITY): Payer: Medicare Other

## 2020-07-17 DIAGNOSIS — E876 Hypokalemia: Secondary | ICD-10-CM

## 2020-07-17 LAB — MAGNESIUM: Magnesium: 1 mg/dL — ABNORMAL LOW (ref 1.7–2.4)

## 2020-07-17 LAB — URINE CULTURE: Culture: >=100000 — AB

## 2020-07-17 LAB — PARATHYROID HORMONE, INTACT (NO CA): PTH: 31 pg/mL (ref 15–65)

## 2020-07-17 MED ORDER — FUROSEMIDE 10 MG/ML IJ SOLN
20.0000 mg | Freq: Two times a day (BID) | INTRAMUSCULAR | Status: DC
  Administered 2020-07-18: 20 mg via INTRAVENOUS
  Filled 2020-07-17: qty 2

## 2020-07-17 MED ORDER — AMOXICILLIN-POT CLAVULANATE 875-125 MG PO TABS
1.0000 | ORAL_TABLET | Freq: Two times a day (BID) | ORAL | Status: DC
  Administered 2020-07-17: 1 via ORAL
  Filled 2020-07-17: qty 1

## 2020-07-17 MED ORDER — MORPHINE SULFATE (PF) 2 MG/ML IV SOLN
1.0000 mg | INTRAVENOUS | Status: DC | PRN
  Administered 2020-07-17 – 2020-07-24 (×14): 1 mg via INTRAVENOUS
  Filled 2020-07-17 (×14): qty 1

## 2020-07-17 MED ORDER — BOOST / RESOURCE BREEZE PO LIQD CUSTOM: 1.0000 | Freq: Two times a day (BID) | ORAL | Status: DC

## 2020-07-17 NOTE — Progress Notes (Signed)
SLP Cancellation Note  Patient Details Name: Cathyrn Deas MRN: [id number] DOB: 09/24/1955   Cancelled treatment:       Reason Eval/Treat Not Completed: Medical issues which prohibited therapy. Pt reported nausea/stomach discomfort and refused all PO trials; RN reports she has refused PO all day. ST will continue efforts,  Ahron Hulbert H. Romie Levee, CCC-SLP Speech Language Pathologist   Georgetta Haber 07/17/2020, 2:54 PM

## 2020-07-17 NOTE — Progress Notes (Signed)
PROGRESS NOTE    Annette Rogers  BMW:[id number] DOB: Jan 02, 1956 DOA: 07/15/2020 PCP: Patient, No Pcp Per   Chief Complaint  Patient presents with  . Weakness    Brief Narrative:  Prior history of COPD, GERD, hyperlipidemia, hypercalcemia, hypertension, history of CVA with left-sided hemiplegia, hyperparathyroidism, mitral valve replacement, history of tricuspid valve repair presents to the hospital for generalized weakness associated with some nausea, vomiting.  She was found to have significant hypercalcemia in relation to known primary hyperparathyroidism.  Assessment & Plan:   Principal Problem:   Hypercalcemia Active Problems:   Hypertension   COPD (chronic obstructive pulmonary disease) (HCC)   GERD (gastroesophageal reflux disease)   Hyperparathyroidism (HCC)   Left hemiplegia (HCC)   Non-rheumatic mitral valve stenosis   Paroxysmal A-fib (HCC)   S/P mitral valve replacement with metallic valve   S/P TVR (tricuspid valve repair)   Hypokalemia   Hyponatremia   Normocytic anemia   Hypomagnesemia   Hypophosphatemia   Hypercalcemia would explain her generalized weakness, nausea and vomiting. Patient received a dose of Zometa and IV Lasix. Calcium has improved to 11.2.  Decrease the rate of fluids and Lasix.  Repeat BMP tonight and follow telemetry.     Primary hyperparathyroidism Associated with hypercalcemia and hypophosphatemia.  No indications for surgery at this time. Continue with cinacalcet.   Hypophosphatemia Replaced.  Repeat tonight  .  Paroxysmal atrial fibrillation Continue with amiodarone and Coreg for rate control and Coumadin  patient is supratherapeutic INR. Repeat INR tomorrow.    History of stroke with dysphagia and dysarthria Patient is on dysphagia 2 diet.  Dietary consult in place.    Primary hypertension Blood pressure parameters appear to be optimal continue with amlodipine and Coreg   GERD Continue with PPI    COPD No  wheezing heard at this time, continue with bronchodilators as needed.    Mild anemia of chronic disease.   History of mitral valve stenosis s/p MVR with mechanical wall and history of tricuspid valve repair Patient is currently on Coumadin for anticoagulation Last INR is supratherapeutic with a level of 5.5 Patient refused blood draws today as there were several attempts and she was reportedly having pain in the left arm.  She is agreeable to blood draws later today or tomorrow.     Patient reports left arm pain and back pain Get ultrasound duplex to rule out DVT in the left arm.      DVT prophylaxis: Coumadin Code Status: (Full code Family Communication: None at bedside Disposition:   Status is: Inpatient  Remains inpatient appropriate because:Ongoing diagnostic testing needed not appropriate for outpatient work up, Unsafe d/c plan and Inpatient level of care appropriate due to severity of illness   Dispo: The patient is from: Home              Anticipated d/c is to: Pending              Anticipated d/c date is: 1 day              Patient currently is not medically stable to d/c.       Consultants:   None.    Procedures: none   Antimicrobials: None    Subjective: Patient reports being nauseated, no abdominal pain or diarrhea Objective: Vitals:   07/16/20 2058 07/17/20 0527 07/17/20 1222 07/17/20 1655  BP: (!) 99/54 120/60 130/65 118/65  Pulse: 73 84  72  Resp: 18 16    Temp: 97.8 F (36.6  C) 99.8 F (37.7 C)    TempSrc: Oral Oral    SpO2: 93% 100%    Weight:      Height:        Intake/Output Summary (Last 24 hours) at 07/17/2020 1737 Last data filed at 07/17/2020 1400 Gross per 24 hour  Intake 2834.17 ml  Output 1950 ml  Net 884.17 ml   Filed Weights   07/15/20 1922 07/16/20 0347  Weight: 71.7 kg 71.1 kg    Examination:  General exam: Appears calm and comfortable  Respiratory system: Clear to auscultation. Respiratory effort  normal. Cardiovascular system: S1 & S2 heard, RRR. No JVD, No pedal edema. Gastrointestinal system: Abdomen is nondistended, soft and nontender. Normal bowel sounds heard. Central nervous system: Alert and oriented to place and person, left upper extremity paralysis. Extremities: No pedal edema. Skin: No rashes, lesions or ulcers Psychiatry: Flat affect    Data Reviewed: I have personally reviewed following labs and imaging studies  CBC: Recent Labs  Lab 07/15/20 2107 07/16/20 0611  WBC 10.7* 7.4  NEUTROABS 7.6  --   HGB 11.3* 11.0*  HCT 37.5 37.5  MCV 87.0 87.8  PLT 343 263    Basic Metabolic Panel: Recent Labs  Lab 07/15/20 2107 07/16/20 0611 07/16/20 1322  NA 134* 137 134*  K 3.2* 3.3* 3.5  CL 96* 102 105  CO2 25 22 19*  GLUCOSE 96 88 81  BUN 26* 23 21  CREATININE 0.73 0.65 0.58  CALCIUM 14.5* 13.4* 11.9*  MG 1.4* 2.1  --   PHOS 1.9* 1.7*  --     GFR: Estimated Creatinine Clearance: 60.8 mL/min (by C-G formula based on SCr of 0.58 mg/dL).  Liver Function Tests: Recent Labs  Lab 07/15/20 2107 07/16/20 0611  AST 42* 35  ALT 17 15  ALKPHOS 92 87  BILITOT 1.5* 1.5*  PROT 8.0 7.2  ALBUMIN 3.5 3.2*    CBG: No results for input(s): GLUCAP in the last 168 hours.   Recent Results (from the past 240 hour(s))  Respiratory Panel by RT PCR (Flu A&B, Covid) - Urine, Clean Catch     Status: None   Collection Time: 07/15/20 11:18 PM   Specimen: Urine, Clean Catch; Nasopharyngeal  Result Value Ref Range Status   SARS Coronavirus 2 by RT PCR NEGATIVE NEGATIVE Final    Comment: (NOTE) SARS-CoV-2 target nucleic acids are NOT DETECTED.  The SARS-CoV-2 RNA is generally detectable in upper respiratoy specimens during the acute phase of infection. The lowest concentration of SARS-CoV-2 viral copies this assay can detect is 131 copies/mL. A negative result does not preclude SARS-Cov-2 infection and should not be used as the sole basis for treatment or other  patient management decisions. A negative result may occur with  improper specimen collection/handling, submission of specimen other than nasopharyngeal swab, presence of viral mutation(s) within the areas targeted by this assay, and inadequate number of viral copies (<131 copies/mL). A negative result must be combined with clinical observations, patient history, and epidemiological information. The expected result is Negative.  Fact Sheet for Patients:  https://www.moore.com/  Fact Sheet for Healthcare Providers:  https://www.young.biz/  This test is no t yet approved or cleared by the [place] FDA and  has been authorized for detection and/or diagnosis of SARS-CoV-2 by FDA under an Emergency Use Authorization (EUA). This EUA will remain  in effect (meaning this test can be used) for the duration of the COVID-19 declaration under Section 564(b)(1) of the Act, 21 U.S.C. section  360bbb-3(b)(1), unless the authorization is terminated or revoked sooner.     Influenza A by PCR NEGATIVE NEGATIVE Final   Influenza B by PCR NEGATIVE NEGATIVE Final    Comment: (NOTE) The Xpert Xpress SARS-CoV-2/FLU/RSV assay is intended as an aid in  the diagnosis of influenza from Nasopharyngeal swab specimens and  should not be used as a sole basis for treatment. Nasal washings and  aspirates are unacceptable for Xpert Xpress SARS-CoV-2/FLU/RSV  testing.  Fact Sheet for Patients: https://www.moore.com/  Fact Sheet for Healthcare Providers: https://www.young.biz/  This test is not yet approved or cleared by the [place] FDA and  has been authorized for detection and/or diagnosis of SARS-CoV-2 by  FDA under an Emergency Use Authorization (EUA). This EUA will remain  in effect (meaning this test can be used) for the duration of the  Covid-19 declaration under Section 564(b)(1) of the Act, 21  U.S.C. section  360bbb-3(b)(1), unless the authorization is  terminated or revoked. Performed at [facility], [address] [place], [place] [address]   Urine culture     Status: Abnormal (Preliminary result)   Collection Time: 07/15/20 11:22 PM   Specimen: Urine, Clean Catch  Result Value Ref Range Status   Specimen Description   Final    URINE, CLEAN CATCH Performed at [facility], [address] [place], [place] [address]    Special Requests   Final    NONE Performed at [facility], [address], [place], [place] [address]    Culture (A)  Final    >=100,000 COLONIES/mL ENTEROCOCCUS FAECALIS SUSCEPTIBILITIES TO FOLLOW Performed at [facility], [address], [place], [place] [address]    Report Status PENDING  Incomplete         Radiology Studies: No results found.      Scheduled Meds: . amiodarone  200 mg Oral Daily  . amLODipine  5 mg Oral Daily  . aspirin EC  81 mg Oral Daily  . atorvastatin  40 mg Oral Daily  . carvedilol  12.5 mg Oral BID WC  . cinacalcet  90 mg Oral Q breakfast  . feeding supplement  1 Container Oral BID BM  . furosemide  20 mg Intravenous Q6H  . mirtazapine  30 mg Oral QHS  . pantoprazole  40 mg Oral Daily  . phosphorus  500 mg Oral QID  . Warfarin - Pharmacist Dosing Inpatient   Does not apply q1600   Continuous Infusions: . sodium chloride 200 mL/hr at 07/17/20 1612     LOS: 1 day       Kathlen Mody, MD [facility]   To contact the attending provider between 7A-7P or the covering provider during after hours 7P-7A, please log into the web site [email or web address] and access using universal [place] password for that web site. If you do not have the password, please call the hospital operator.  07/17/2020, 5:37 PM

## 2020-07-17 NOTE — Progress Notes (Signed)
Pt sleeping since admin of Zofran and Morphine IV for pain and nausea. Pt states, "I finally feel like I'm getting some relief." Pt refuses supper tray or oral liquids at this time. IVF stopped for transport to [facility] for spine x-ray. Pt states understanding.

## 2020-07-17 NOTE — Progress Notes (Signed)
Initial Nutrition Assessment  DOCUMENTATION CODES:   Obesity unspecified  INTERVENTION:  Boost Breeze po BID, each supplement provides 250 kcal and 9 grams of protein  Encouraged po intake  Education provided on foods to try when feeling nauseas   NUTRITION DIAGNOSIS:   Inadequate oral intake related to nausea as evidenced by per patient/family report, meal completion < 50%.    GOAL:   Patient will meet greater than or equal to 90% of their needs    MONITOR:   Labs, Supplement acceptance, PO intake, Weight trends, I & O's  REASON FOR ASSESSMENT:   Consult Assessment of nutrition requirement/status  ASSESSMENT:  64 year old female with medical history significant of COPD, gastrojejunostomy, GERD, HLD, hypercalcemia, hyperparathyroidism, HTN, history of CVA with left-sided hemiplegia, non-rheumatic mitral valve stenosis with metallic valve replacement, s/p tricuspid repair, and recently discharged with home health s/p admission for right MCA stroke presents with 1 week history of feeling nauseas and weakness. Pt admitted with hypercalcemia.  Patient resting quietly in bed this afternoon, she awakes easily with name call. Observed untouched lunch tray on bedside table. Pt reports nausea has improved, RD encouraged pt to try eating some of lunch, pt agreeable. RD raised head of bed and tray brought to bedside. Pt complained of nausea after smelling food, no longer interested in eating and requested ice water. Nausea likely related to hypercalcemia, suspect symptoms will improve as levels normalize. RD suggested ordering cold foods as these are usually tolerated better. Meal completion 0-25% x 2 documented meals this admission. Pt reports that she is not a big eater at baseline, likes sausage, eggs, and pancakes for breakfast, eats sandwiches for lunch, and typically does not eat dinner. She has tried nutrition supplements in the past, says she does not like them. RD suggested Boost  Breeze, explained it was more like a juice, pt willing to try.   Limited weight history for review, noted 80.5 kg (177.1 lbs) on 06/21/20 during [facility] admission. Pt currently weighs 71.1kg (156.42 lbs), which indicates ~ 21 lb (11.7%) wt loss in the past month. This is significant for time time, however unsure of the accuracy. Will order daily weights.  Per Care Everywhere, GJ-tube placed at [facility] on 9/16 secondary to dysphagia s/p right MCA stroke. She was receiving  Nutren 2.0 @ 75 ml/hr x 12 hrs (1800-0600) Regimen provided 1800 kcal, 72 grams protein, and 630 ml free water  I/Os: +2160.2 ml since admit UOP: 1650 ml x 24 hrs  Medications reviewed and include: Augmentin, Sensipar, Lasix IV 20 mg every 12 hours, Remeron, Protonix, Warfarin  IVF: NaCl @ 100 ml/hr  Labs: Na 134 (L), Ca 11.9 (H) trending down  NUTRITION - FOCUSED PHYSICAL EXAM: Mild orbital and buccal fat depletions; Mild temple, clavicle, dorsal hand muscle depletions, Mild LUE edema, Non-pitting BLE edema   Diet Order:   Diet Order            DIET DYS 2 Room service appropriate? Yes; Fluid consistency: Thin  Diet effective now                 EDUCATION NEEDS:   Education needs have been addressed  Skin:  Skin Assessment: Reviewed RN Assessment  Last BM:  pta  Height:   Ht Readings from Last 1 Encounters:  07/16/20 5' (1.524 m)    Weight:   Wt Readings from Last 1 Encounters:  07/16/20 71.1 kg    BMI:  Body mass index is 30.61 kg/m.  Estimated  Nutritional Needs:   Kcal:  1900-2100  Protein:  90-105  Fluid:  >/= 1.9 L/day   Lars Masson, RD, LDN Clinical Nutrition After Hours/Weekend Pager # in Amion

## 2020-07-17 NOTE — Progress Notes (Signed)
ANTICOAGULATION CONSULT NOTE -   Pharmacy Consult for Coumadin Indication: MVR and Afib  No Known Allergies  Patient Measurements: Height: 5' (152.4 cm) Weight: 71.1 kg (156 lb 12 oz) IBW/kg (Calculated) : 45.5  Vital Signs: Temp: 99.8 F (37.7 C) (11/03 0527) Temp Source: Oral (11/03 0527) BP: 130/65 (11/03 1222) Pulse Rate: 84 (11/03 0527)  Labs: Recent Labs    07/15/20 2107 07/16/20 0611 07/16/20 1322  HGB 11.3* 11.0*  --   HCT 37.5 37.5  --   PLT 343 263  --   LABPROT  --  48.6*  --   INR  --  5.5*  --   CREATININE 0.73 0.65 0.58    Estimated Creatinine Clearance: 60.8 mL/min (by C-G formula based on SCr of 0.58 mg/dL).   Medical History: Past Medical History:  Diagnosis Date  . COPD (chronic obstructive pulmonary disease) (HCC) 04/23/2020  . Gastrojejunostomy tube status (HCC) 05/31/2020  . GERD (gastroesophageal reflux disease) 07/15/2020  . High cholesterol   . Hypercalcemia 07/15/2020  . Hyperparathyroidism (HCC) 05/31/2020  . Hypertension   . Left hemiplegia (HCC) 04/29/2020  . Non-rheumatic mitral valve stenosis 12/09/2017  . Paroxysmal A-fib (HCC) 04/23/2020  . S/P mitral valve replacement with metallic valve 04/29/2020   Formatting of this note might be different from the original. [employer]. Jude regent mechanical  Procedures:   1. Mitral valve replacement (25 mm [employer] Regent) (CPT [phone]) 2. Tricuspid valve repair (annuloplasty only)  (30  mm CE Physio ring) (CPT 33464) 4. 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 Procedures:   1. Mitral valve replacement (25 mm [employer] Regent) (CPT [phone]) 2. Tricuspid valve repair (annuloplasty only)  (30  mm CE Physio ring) (CPT 33464) 4. Reoperation on heart/redo-sternotomy (s/p MVR 2005)    Medications:  Medications Prior to Admission  Medication Sig Dispense Refill Last Dose  . acetaminophen (TYLENOL)  325 MG tablet Take by mouth.   07/15/2020 at Unknown time  . amiodarone (PACERONE) 200 MG tablet Take by mouth.   07/15/2020 at Unknown time  . amLODipine (NORVASC) 5 MG tablet Take 5 mg by mouth daily.   07/14/2020 at Unknown time  . aspirin 81 MG EC tablet Take by mouth.   07/15/2020 at Unknown time  . atorvastatin (LIPITOR) 40 MG tablet Take by mouth.   07/15/2020 at Unknown time  . carvedilol (COREG) 12.5 MG tablet Take by mouth.   07/15/2020 at 1800  . Cholecalciferol 50 MCG (2000 UT) TABS Take by mouth.   07/15/2020 at Unknown time  . cinacalcet (SENSIPAR) 90 MG tablet Take by mouth.   unknown  . cyclobenzaprine (FLEXERIL) 5 MG tablet Take 1 tablet (5 mg total) by mouth 3 (three) times daily as needed. For muscle spasms.  May cause drowsiness 15 tablet 0 07/15/2020 at Unknown time  . diclofenac Sodium (VOLTAREN) 1 % GEL Apply topically.   unknown  . furosemide (LASIX) 40 MG tablet Take by mouth.   07/15/2020 at Unknown time  . gabapentin (NEURONTIN) 400 MG capsule Take by mouth.   07/15/2020 at Unknown time  . mirtazapine (REMERON) 30 MG tablet Take 30 mg by mouth at bedtime.   07/14/2020 at Unknown time  . nystatin (MYCOSTATIN/NYSTOP) powder Apply topically.     Marland Kitchen omeprazole (PRILOSEC) 20 MG capsule Take 20 mg by mouth daily.   07/15/2020 at Unknown time  . ondansetron (  ZOFRAN-ODT) 4 MG disintegrating tablet Take by mouth.   unknown  . pantoprazole (PROTONIX) 40 MG tablet Take by mouth.   07/15/2020 at Unknown time  . traMADol (ULTRAM) 50 MG tablet Take 1 tablet (50 mg total) by mouth every 6 (six) hours as needed. 12 tablet 0 07/15/2020 at Unknown time  . traZODone (DESYREL) 50 MG tablet Take by mouth.   07/14/2020 at Unknown time  . warfarin (COUMADIN) 2 MG tablet Take by mouth.   07/14/2020 at 0300  . ipratropium-albuterol (DUONEB) 0.5-2.5 (3) MG/3ML SOLN Inhale into the lungs. (Patient not taking: Reported on 07/15/2020)   Not Taking at Unknown time   Scheduled:  . amiodarone  200 mg Oral Daily   . amLODipine  5 mg Oral Daily  . aspirin EC  81 mg Oral Daily  . atorvastatin  40 mg Oral Daily  . carvedilol  12.5 mg Oral BID WC  . cinacalcet  90 mg Oral Q breakfast  . furosemide  20 mg Intravenous Q6H  . mirtazapine  30 mg Oral QHS  . pantoprazole  40 mg Oral Daily  . phosphorus  500 mg Oral QID  . Warfarin - Pharmacist Dosing Inpatient   Does not apply q1600   Infusions:  . sodium chloride 200 mL/hr at 07/17/20 1027    Assessment: 64yo female c/o weakness and nausea x1wk, admitted for hypercalcemia, to continue Coumadin for MVR/Afib. INR is elevated at 5.5 on 11/2  Lab has not drawn INR today as of 13:30. Will F/U and order warfarin dose if needed  Goal of Therapy:  INR 2.5-3.5   Plan:  No coumadin today until lab draw due to prior elevated INR. Monitor INR for dose adjustments. Monitor for S/S of bleeding  Judeth Cornfield, PharmD Clinical Pharmacist 07/17/2020 1:23 PM

## 2020-07-17 NOTE — Progress Notes (Signed)
Pt has refused all food and fluids today except for small sips of water. Pt c/o nausea this am and vomited 100 ml of green bile colored emesis. Antiemetic administered and nausea was relieved per pt, but she still refused any oral intake. 10am scheduled meds held till 1230 due to pt stating she didn"t want anything on her stomach this am. No further c/o nausea/vomiting since meds admin'd. Multiple attempts made by different staff members to get pt to eat and drink all without success.

## 2020-07-17 NOTE — TOC Progression Note (Signed)
Transition of Care [facility]) - Progression Note   Patient Details  Name: Annette Rogers MRN: [id number] Date of Birth: [birth year]  Transition of Care [facility]) CM/SW Contact  Ewing Schlein, LCSW Phone Number: 07/17/2020, 2:06 PM  Clinical Narrative: TOC received call from [place] with [facility]. Per Steward Drone, the [facility] RN had concerns last time she was at the home as the patient did not get her feedings over the weekend due to the tube being plugged. RN also observed roaches in the patient's bed and is concerned the patient is not getting her medications correctly. CSW explained the RN would need to make an APS report as she was the person to observe those issues in the home. Steward Drone reported she would follow up with the RN to make that report. TOC to follow.  Expected Discharge Plan: Home w Home Health Services Barriers to Discharge: Continued Medical Work up  Expected Discharge Plan and Services Expected Discharge Plan: Home w Home Health Services In-house Referral: Clinical Social Work Post Acute Care Choice: Home Health Living arrangements for the past 2 months: Single Family Home  Readmission Risk Interventions No flowsheet data found.

## 2020-07-17 NOTE — Progress Notes (Signed)
Pt finally agreed to eat ice cream and drink water. Only took 2 bites of ice cream and 2 sips of water. States, "I don't want no more. I can't stand it on my stomach." Pt is complaining of increased pain in her back and left arm. States no real relief from previously admin'd pain meds. Offered tylenol, as it is not time for Tramadol, pt declines. Pt asked me to contact MD for "something stronger. Maybe if I quit hurting so bad I'll feel more like eating." MD Blake Divine notified with pt request. Pt asking why she can't go home today. Advised pt that with her pain and n/v and decreased oral intake, she was not quite ready for discharge. Pt closed her eyes and said, "OK."

## 2020-07-18 ENCOUNTER — Inpatient Hospital Stay (HOSPITAL_COMMUNITY): Payer: Medicare Other

## 2020-07-18 LAB — CBC
HCT: 27.7 % — ABNORMAL LOW (ref 36.0–46.0)
Hemoglobin: 8.6 g/dL — ABNORMAL LOW (ref 12.0–15.0)
MCH: 26.8 pg (ref 26.0–34.0)
MCHC: 31 g/dL (ref 30.0–36.0)
MCV: 86.3 fL (ref 80.0–100.0)
Platelets: 242 10*3/uL (ref 150–400)
RBC: 3.21 MIL/uL — ABNORMAL LOW (ref 3.87–5.11)
RDW: 15.9 % — ABNORMAL HIGH (ref 11.5–15.5)
WBC: 7.2 10*3/uL (ref 4.0–10.5)
nRBC: 0 % (ref 0.0–0.2)

## 2020-07-18 LAB — MAGNESIUM
Magnesium: 1 mg/dL — ABNORMAL LOW (ref 1.7–2.4)
Magnesium: 2.2 mg/dL (ref 1.7–2.4)

## 2020-07-18 LAB — PROTIME-INR
INR: 4.8 (ref 0.8–1.2)
INR: 4.8 (ref 0.8–1.2)
INR: 5.2 (ref 0.8–1.2)
Prothrombin Time: 43.8 seconds — ABNORMAL HIGH (ref 11.4–15.2)
Prothrombin Time: 43.8 seconds — ABNORMAL HIGH (ref 11.4–15.2)
Prothrombin Time: 46.1 seconds — ABNORMAL HIGH (ref 11.4–15.2)

## 2020-07-18 LAB — RENAL FUNCTION PANEL
Albumin: 2.6 g/dL — ABNORMAL LOW (ref 3.5–5.0)
Albumin: 2.5 g/dL — ABNORMAL LOW (ref 3.5–5.0)
Anion gap: 7 (ref 5–15)
Anion gap: 10 (ref 5–15)
BUN: 14 mg/dL (ref 8–23)
BUN: 14 mg/dL (ref 8–23)
CO2: 24 mmol/L (ref 22–32)
CO2: 21 mmol/L — ABNORMAL LOW (ref 22–32)
Calcium: 9.3 mg/dL (ref 8.9–10.3)
Calcium: 9.1 mg/dL (ref 8.9–10.3)
Chloride: 107 mmol/L (ref 98–111)
Chloride: 108 mmol/L (ref 98–111)
Creatinine, Ser: 0.7 mg/dL (ref 0.44–1.00)
Creatinine, Ser: 0.74 mg/dL (ref 0.44–1.00)
GFR, Estimated: >60 mL/min (ref >60)
GFR, Estimated: >60 mL/min (ref >60)
Glucose, Bld: 78 mg/dL (ref 70–99)
Glucose, Bld: 75 mg/dL (ref 70–99)
Phosphorus: 2 mg/dL — ABNORMAL LOW (ref 2.5–4.6)
Phosphorus: 1.7 mg/dL — ABNORMAL LOW (ref 2.5–4.6)
Potassium: 2.4 mmol/L — CL (ref 3.5–5.1)
Potassium: 2.4 mmol/L — CL (ref 3.5–5.1)
Sodium: 138 mmol/L (ref 135–145)
Sodium: 139 mmol/L (ref 135–145)

## 2020-07-18 LAB — BASIC METABOLIC PANEL
Anion gap: 12 (ref 5–15)
Anion gap: 8 (ref 5–15)
BUN: 14 mg/dL (ref 8–23)
BUN: 14 mg/dL (ref 8–23)
CO2: 20 mmol/L — ABNORMAL LOW (ref 22–32)
CO2: 23 mmol/L (ref 22–32)
Calcium: 9.3 mg/dL (ref 8.9–10.3)
Calcium: 8.4 mg/dL — ABNORMAL LOW (ref 8.9–10.3)
Chloride: 108 mmol/L (ref 98–111)
Chloride: 104 mmol/L (ref 98–111)
Creatinine, Ser: 0.67 mg/dL (ref 0.44–1.00)
Creatinine, Ser: 0.77 mg/dL (ref 0.44–1.00)
GFR, Estimated: >60 mL/min (ref >60)
GFR, Estimated: >60 mL/min (ref >60)
Glucose, Bld: 72 mg/dL (ref 70–99)
Glucose, Bld: 113 mg/dL — ABNORMAL HIGH (ref 70–99)
Potassium: 2.5 mmol/L — CL (ref 3.5–5.1)
Potassium: 3 mmol/L — ABNORMAL LOW (ref 3.5–5.1)
Sodium: 140 mmol/L (ref 135–145)
Sodium: 135 mmol/L (ref 135–145)

## 2020-07-18 LAB — URINE CULTURE

## 2020-07-18 LAB — PHOSPHORUS
Phosphorus: 1.8 mg/dL — ABNORMAL LOW (ref 2.5–4.6)
Phosphorus: 4.2 mg/dL (ref 2.5–4.6)

## 2020-07-18 LAB — HIV ANTIBODY (ROUTINE TESTING W REFLEX): HIV Screen 4th Generation wRfx: NONREACTIVE

## 2020-07-18 MED ORDER — POTASSIUM CHLORIDE CRYS ER 20 MEQ PO TBCR
40.0000 meq | EXTENDED_RELEASE_TABLET | Freq: Once | ORAL | Status: AC
  Administered 2020-07-18: 40 meq via ORAL
  Filled 2020-07-18: qty 2

## 2020-07-18 MED ORDER — K PHOS MONO-SOD PHOS DI & MONO 155-852-130 MG PO TABS
250.0000 mg | ORAL_TABLET | Freq: Two times a day (BID) | ORAL | Status: AC
  Administered 2020-07-18 – 2020-07-19 (×3): 250 mg via ORAL
  Filled 2020-07-18 (×3): qty 1

## 2020-07-18 MED ORDER — CARVEDILOL 3.125 MG PO TABS
3.1250 mg | ORAL_TABLET | Freq: Two times a day (BID) | ORAL | Status: DC
  Administered 2020-07-18 – 2020-07-24 (×10): 3.125 mg via ORAL
  Filled 2020-07-18 (×11): qty 1

## 2020-07-18 MED ORDER — POTASSIUM PHOSPHATES 15 MMOLE/5ML IV SOLN
30.0000 mmol | Freq: Once | INTRAVENOUS | Status: DC
  Filled 2020-07-18: qty 10

## 2020-07-18 MED ORDER — PROMETHAZINE HCL 25 MG/ML IJ SOLN
12.5000 mg | Freq: Four times a day (QID) | INTRAMUSCULAR | Status: DC | PRN
  Administered 2020-07-18 – 2020-07-23 (×2): 12.5 mg via INTRAVENOUS
  Filled 2020-07-18 (×2): qty 1

## 2020-07-18 MED ORDER — MAGNESIUM SULFATE 4 GM/100ML IV SOLN
4.0000 g | Freq: Once | INTRAVENOUS | Status: AC
  Administered 2020-07-18: 4 g via INTRAVENOUS
  Filled 2020-07-18: qty 100

## 2020-07-18 MED ORDER — POTASSIUM PHOSPHATES 15 MMOLE/5ML IV SOLN
30.0000 mmol | Freq: Once | INTRAVENOUS | Status: AC
  Administered 2020-07-18: 30 mmol via INTRAVENOUS
  Filled 2020-07-18: qty 10

## 2020-07-18 MED ORDER — POTASSIUM CHLORIDE 10 MEQ/100ML IV SOLN
10.0000 meq | INTRAVENOUS | Status: AC
  Administered 2020-07-18 (×2): 10 meq via INTRAVENOUS
  Filled 2020-07-18 (×2): qty 100

## 2020-07-18 MED ORDER — SODIUM CHLORIDE 0.9 % IV SOLN
1.0000 g | Freq: Four times a day (QID) | INTRAVENOUS | Status: DC
  Administered 2020-07-18 – 2020-07-22 (×13): 1 g via INTRAVENOUS
  Filled 2020-07-18 (×12): qty 1000
  Filled 2020-07-18: qty 1
  Filled 2020-07-18 (×7): qty 1000
  Filled 2020-07-18: qty 1

## 2020-07-18 NOTE — Progress Notes (Signed)
  Hx COPD; CVA; HLD; HTN  GJ placed at [facility] 05/2020 Admitted to [facility] with N/V  G port is leaking -- request evaluation  I have seen pt Injected G and J ports with water  G port leaking at distal end--- small/minute cracks in tubing  Will need replacement This would require transfer to [facility] for replacement-- then return to [facility]; would be tomorrow at earliest secondary schedule.  Or  Consider Gen Surg eval here at [facility]  Spoke to Dr Blake Divine She will consider and call me back with decision

## 2020-07-18 NOTE — Progress Notes (Signed)
ANTICOAGULATION CONSULT NOTE -   Pharmacy Consult for Coumadin Indication: MVR and Afib  No Known Allergies  Patient Measurements: Height: 5' (152.4 cm) Weight: 71.1 kg (156 lb 12 oz) IBW/kg (Calculated) : 45.5  Vital Signs: Temp: 98.1 F (36.7 C) (11/04 0528) Temp Source: Oral (11/04 0854) BP: 102/51 (11/04 0854) Pulse Rate: 77 (11/04 0854)  Labs: Recent Labs    07/15/20 2107 07/15/20 2107 07/16/20 0611 07/16/20 0611 07/16/20 1322 07/18/20 0030 07/18/20 0756  HGB 11.3*  --  11.0*  --   --   --   --   HCT 37.5  --  37.5  --   --   --   --   PLT 343  --  263  --   --   --   --   LABPROT  --   --  48.6*  --   --  43.8* 43.8*  INR  --   --  5.5*  --   --  4.8* 4.8*  CREATININE 0.73   < > 0.65   < > 0.58 0.70 0.74   < > = values in this interval not displayed.    Estimated Creatinine Clearance: 60.8 mL/min (by C-G formula based on SCr of 0.74 mg/dL).   Assessment: 64yo female c/o weakness and nausea x1wk, admitted for hypercalcemia, to continue Coumadin for MVR/Afib.   Home dose: warfarin 2mg  daily Last dose:  07/14/20 Drug Interactions (major): amiodarone 200mg  daily  07/18/20 1100 UPDATE INR: 4.8-->continue to hold warfarin CBC:  Hb 11.2 on 11/2   RN reports no s/s of bleeding at this time   Goal of Therapy:  INR 2.5-3.5   Plan:  Hold warfarin again  today for INR of 4.8 F/U re-start of warfarin when INR back in range-->may need to lower dose d/t amiodarone interaction Daily INR and every other day CBC Monitor for S/S of bleeding  13/4/21, Pharm. D. Clinical Pharmacist 07/18/2020 11:08 AM

## 2020-07-18 NOTE — Progress Notes (Signed)
RN called due to patient's potassium level of 2.4, chart was reviewed and patient was noted to also have hypophosphatemia and hypomagnesemia.  These were replenished. Hypoalbuminemia was also noted, consider protein supplement and/or dietary consult.

## 2020-07-18 NOTE — Evaluation (Signed)
Physical Therapy Evaluation Patient Details Name: Annette Rogers MRN: [id number] DOB: January 01, 1956 Today's Date: 07/18/2020   History of Present Illness  Annette Rogers is a 64 y.o. 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     Clinical Impression  Patient demonstrates slow labored movement for sitting up at bedside with inability to use left side due to weakness, once seated able to keep trunk in midline for up to 2-3 minutes before falling to left side after fatiguing, and unable to stand or transfer to chair due to left side weakness.  Patient put back to bed with 2 person assist after therapy.  Patient will benefit from continued physical therapy in hospital and recommended venue below to increase strength, balance, endurance for safe ADLs and gait.     Follow Up Recommendations Home health PT;Supervision for mobility/OOB;Supervision - Intermittent    Equipment Recommendations  None recommended by PT    Recommendations for Other Services       Precautions / Restrictions Precautions Precautions: Fall Restrictions Weight Bearing Restrictions: No      Mobility  Bed Mobility Overal bed mobility: Needs Assistance Bed Mobility: Supine to Sit;Sit to Supine     Supine to sit: Max assist Sit to supine: Max assist   General bed mobility comments: increased time, slow labored movement    Transfers                    Ambulation/Gait                Stairs            Wheelchair  Mobility    Modified Rankin (Stroke Patients Only)       Balance Overall balance assessment: Needs assistance Sitting-balance support: Feet supported;No upper extremity supported Sitting balance-Leahy Scale: Poor Sitting balance - Comments: fair/poor with frequent leaning to the left Postural control: Left lateral lean                                   Pertinent Vitals/Pain Pain Assessment: Faces Faces Pain Scale: Hurts little more Pain Descriptors / Indicators: Grimacing;Guarding;Moaning Pain Intervention(s): Limited activity within patient's tolerance;Monitored during session;Repositioned    Home Living Family/patient expects to be discharged to:: Private residence Living Arrangements: Children;Other relatives Available Help at Discharge: Family;Available 24 hours/day Type of Home: House Home Access: Ramped entrance     Home Layout: One level Home Equipment: Walker - 2 wheels;Cane - single point;Bedside commode;Shower seat;Wheelchair - manual;Hospital bed      Prior Function Level of Independence: Needs assistance   Gait / Transfers Assistance Needed: Pt using mechanical lift for transfers, does not ambulate  ADL's / Homemaking Assistance Needed: family assists with all ADLs, pt feeds herself when she eats orally         Hand Dominance   Dominant Hand: Right    Extremity/Trunk Assessment   Upper Extremity Assessment Upper Extremity Assessment: Defer to OT evaluation    Lower Extremity Assessment Lower Extremity Assessment: Generalized weakness;RLE deficits/detail;LLE deficits/detail RLE Deficits / Details: grossly -  3/5 RLE Sensation: WNL RLE Coordination: WNL LLE Deficits / Details: grossly 0/5 LLE Sensation: decreased light touch;decreased proprioception LLE Coordination: decreased gross motor    Cervical / Trunk Assessment Cervical / Trunk Assessment: Normal  Communication   Communication: Expressive difficulties  Cognition  Arousal/Alertness: Awake/alert Behavior During Therapy: WFL for tasks assessed/performed Overall Cognitive Status: Within Functional Limits for tasks assessed                                        General Comments      Exercises     Assessment/Plan    PT Assessment Patient needs continued PT services  PT Problem List Decreased strength;Decreased activity tolerance;Decreased balance;Decreased mobility       PT Treatment Interventions Balance training;DME instruction;Functional mobility training;Therapeutic activities;Therapeutic exercise;Patient/family education    PT Goals (Current goals can be found in the Care Plan section)  Acute Rehab PT Goals Patient Stated Goal: To return home  PT Goal Formulation: With patient Time For Goal Achievement: 08/01/20 Potential to Achieve Goals: Fair    Frequency Min 2X/week   Barriers to discharge        Co-evaluation PT/OT/SLP Co-Evaluation/Treatment: Yes Reason for Co-Treatment: Complexity of the patient's impairments (multi-system involvement);For patient/therapist safety;To address functional/ADL transfers PT goals addressed during session: Mobility/safety with mobility;Balance         AM-PAC PT "6 Clicks" Mobility  Outcome Measure Help needed turning from your back to your side while in a flat bed without using bedrails?: A Lot Help needed moving from lying on your back to sitting on the side of a flat bed without using bedrails?: A Lot Help needed moving to and from a bed to a chair (including a wheelchair)?: Total Help needed standing up from a chair using your arms (e.g., wheelchair or bedside chair)?: Total Help needed to walk in hospital room?: Total Help needed climbing 3-5 steps with a railing? : Total 6 Click Score: 8    End of Session   Activity Tolerance: Patient tolerated treatment well;Patient limited by fatigue Patient left: in bed;with call bell/phone within reach Nurse Communication:  Mobility status PT Visit Diagnosis: Unsteadiness on feet (R26.81);Other abnormalities of gait and mobility (R26.89);Muscle weakness (generalized) (M62.81)    Time: 8242-3536 PT Time Calculation (min) (ACUTE ONLY): 25 min   Charges:   PT Evaluation $PT Eval Moderate Complexity: 1 Mod PT Treatments $Therapeutic Activity: 23-37 mins        12:31 PM, 07/18/20 Ocie Bob, MPT Physical Therapist with [facility] [phone] office [phone] mobile phone

## 2020-07-18 NOTE — Progress Notes (Signed)
CRITICAL VALUE ALERT  Critical Value:  INR 4.8, potassium 2.4  Date & Time Notied:  07/18/20 0220  Provider Notified: Dr. Thomes Dinning  Orders Received/Actions taken: No orders for INR, coumadin currently held. Potassium supplements ordered. See new orders.

## 2020-07-18 NOTE — Progress Notes (Signed)
Nutrition Brief Note   RD consulted for assessment of nutritional status.  Patient seen yesterday secondary to MST as well as consult for assessment of nutritional status. Please see RD note from 11/3 for full assessment.   Noted IR evaluation of GJ-tube today. Per notes, G port leaking at distal end. This would require transfer to [facility] for replacement, likely tomorrow at earliest or possible general surgery evaluation at [facility].   Will provide further recommendations pending plan. No additional nutrition interventions warranted at this time.   Lars Masson, RD, LDN Clinical Nutrition After Hours/Weekend Pager # in Amion

## 2020-07-18 NOTE — Progress Notes (Signed)
Palliative Medicine Team  Due to high volume of referrals, there is a delay seeing this patient. PMT provider off service at [facility] over the weekend but will arrange goals of care with patient and family on Monday if still hospitalized. Thank you for the opportunity to participate in the care of Ms. Siegfried.  NO CHARGE  Vennie Homans, FNP-C Palliative Medicine Team  Phone: [phone] Fax: [phone]

## 2020-07-18 NOTE — Plan of Care (Signed)
  Problem: Acute Rehab PT Goals(only PT should resolve) Goal: Pt will Roll Supine to Side Outcome: Progressing Flowsheets (Taken 07/18/2020 1233) Pt will Roll Supine to Side: with mod assist Goal: Pt Will Go Supine/Side To Sit Outcome: Progressing Flowsheets (Taken 07/18/2020 1233) Pt will go Supine/Side to Sit: with moderate assist Goal: Pt Will Go Sit To Supine/Side Outcome: Progressing Flowsheets (Taken 07/18/2020 1233) Pt will go Sit to Supine/Side: with moderate assist Goal: Patient Will Perform Sitting Balance Outcome: Progressing Flowsheets (Taken 07/18/2020 1233) Patient will perform sitting balance: with min guard assist   12:33 PM, 07/18/20 Ocie Bob, MPT Physical Therapist with [facility] [phone] office [phone] mobile phone

## 2020-07-18 NOTE — Evaluation (Signed)
Occupational Therapy Evaluation Patient Details Name: Annette Rogers MRN: [id number] DOB: 1956-03-19 Today's Date: 07/18/2020    History of Present Illness Annette Rogers is a 64 y.o. 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    Clinical Impression   Pt agreeable to OT evaluation this am. Pt with hx of right MCA CVA in 04/2020, discharged from [facility] on 06/24/20 after rehabilitation. Pt reports family assists with all ADLs, also has PT/OT/RN [facility] services. Currently pt requiring heavy assistance for ADL completion, max to total assist with all tasks with exception of self-feeding and simple grooming tasks for which set-up is required. Pt appears to be close to her baseline since CVA. Recommend SNF on discharge if family is unable to manage current physical needs and level of care required. If family is able to handle pt's needs, recommend resume HH services.     Follow Up Recommendations  SNF;Home health OT    Equipment Recommendations  None recommended by OT       Precautions / Restrictions Precautions Precautions: Fall Restrictions Weight Bearing Restrictions: No      Mobility Bed Mobility               General bed mobility comments: Defer to PT note    Transfers                          ADL either performed or assessed with clinical judgement   ADL Overall ADL's : Needs assistance/impaired Eating/Feeding: Set up;Sitting   Grooming: Set up;Bed level Grooming Details (indicate  cue type and reason): pt able to use RUE for simple grooming tasks with set-up Upper Body Bathing: Maximal assistance;Bed level   Lower Body Bathing: Total assistance;Bed level   Upper Body Dressing : Moderate assistance;Maximal assistance;Bed level   Lower Body Dressing: Total assistance;Bed level   Toilet Transfer: Maximal assistance;Total assistance   Toileting- Clothing Manipulation and Hygiene: Maximal assistance;Bed level     Tub/Shower Transfer Details (indicate cue type and reason): does not complete   General ADL Comments: Pt requires heavy assistance for ADLs due to left hemiplegia      Vision Baseline Vision/History: No visual deficits Patient Visual Report: No change from baseline Vision Assessment?: No apparent visual deficits            Pertinent Vitals/Pain Pain Assessment: Faces Faces Pain Scale: Hurts little more Pain Location: LUE Pain Descriptors / Indicators: Grimacing;Guarding;Moaning Pain Intervention(s): Limited activity within patient's tolerance;Monitored during session;Repositioned     Hand Dominance Right   Extremity/Trunk Assessment Upper Extremity Assessment Upper Extremity Assessment: LUE deficits/detail LUE Deficits / Details: LUE flaccid-no active movement, edema present throughout LUE, pt wearing edema glove LUE: Unable to fully assess due to pain LUE Sensation: decreased light touch LUE Coordination: decreased fine motor;decreased gross motor   Lower Extremity Assessment Lower Extremity Assessment: Defer to PT evaluation       Communication Communication Communication: Expressive difficulties (slurred speech from prior CVA)   Cognition Arousal/Alertness: Awake/alert Behavior During Therapy: WFL for tasks assessed/performed Overall Cognitive Status: Within Functional Limits for tasks assessed  Home Living Family/patient expects to be discharged to:: Private  residence Living Arrangements: Children;Other relatives Available Help at Discharge: Family;Available 24 hours/day Type of Home: House Home Access: Ramped entrance     Home Layout: One level     Bathroom Shower/Tub: Chief Strategy Officer: Standard     Home Equipment: Environmental consultant - 2 wheels;Cane - single point;Bedside commode;Shower seat;Wheelchair - manual;Hospital bed          Prior Functioning/Environment Level of Independence: Needs assistance  Gait / Transfers Assistance Needed: Pt using mechanical lift for transfers, does not ambulate ADL's / Homemaking Assistance Needed: family assists with all ADLs, pt feeds herself when she eats orally             OT Problem List: Decreased strength;Decreased activity tolerance;Impaired balance (sitting and/or standing);Decreased coordination;Decreased safety awareness;Decreased knowledge of use of DME or AE;Impaired UE functional use;Pain      OT Treatment/Interventions:      OT Goals(Current goals can be found in the care plan section) Acute Rehab OT Goals Patient Stated Goal: To return home   OT Frequency:             Co-evaluation PT/OT/SLP Co-Evaluation/Treatment: Yes Reason for Co-Treatment: Complexity of the patient's impairments (multi-system involvement);For patient/therapist safety;To address functional/ADL transfers   OT goals addressed during session: ADL's and self-care         End of Session    Activity Tolerance: Patient tolerated treatment well Patient left: in bed;with call bell/phone within reach;with nursing/sitter in room  OT Visit Diagnosis: Muscle weakness (generalized) (M62.81);Hemiplegia and hemiparesis Hemiplegia - Right/Left: Left Hemiplegia - dominant/non-dominant: Non-Dominant Hemiplegia - caused by: Cerebral infarction                Time: 5035-4656 OT Time Calculation (min): 36 min Charges:  OT General Charges $OT Visit: 1 Visit OT Evaluation $OT Eval Moderate Complexity: 1  [address], OTR/L  [phone] 07/18/2020, 8:37 AM

## 2020-07-18 NOTE — TOC Progression Note (Signed)
Transition of Care [facility]) - Progression Note   Patient Details  Name: Annette Rogers MRN: [id number] Date of Birth: 05-09-1956  Transition of Care [facility]) CM/SW Contact  Ewing Schlein, LCSW Phone Number: 07/18/2020, 11:28 AM  Clinical Narrative: PT evaluation recommends SNF. CSW spoke with patient's daughter, Isabella Stalling, to discuss HH vs. SNF at discharge. Per daughter, it would be beneficial for the patient to go to SNF, but the patient will prefer to come home with [facility] and will likely not agree to SNF at this time. TOC to follow.  Expected Discharge Plan: Home w Home Health Services Barriers to Discharge: Continued Medical Work up  Expected Discharge Plan and Services Expected Discharge Plan: Home w Home Health Services In-house Referral: Clinical Social Work Post Acute Care Choice: Home Health Living arrangements for the past 2 months: Single Family Home  Readmission Risk Interventions No flowsheet data found.

## 2020-07-18 NOTE — Progress Notes (Signed)
Small slit noted in g-tube when attempting to flush, audible air leaking. Notified attending, Dr. Blake Divine to make aware.

## 2020-07-18 NOTE — Progress Notes (Signed)
PROGRESS NOTE    Annette Rogers  RJJ:[id number] DOB: January 11, 1956 DOA: 07/15/2020 PCP: Patient, No Pcp Per   Chief Complaint  Patient presents with  . Weakness    Brief Narrative:  Prior history of COPD, GERD, hyperlipidemia, hypercalcemia, hypertension, history of CVA with left-sided hemiplegia, hyperparathyroidism, mitral valve replacement, history of tricuspid valve repair presents to the hospital for generalized weakness associated with some nausea, vomiting.  She was found to have significant hypercalcemia in relation to known primary hyperparathyroidism, in addition to hypercalcemia, she had profound hypokalemia, hypomagnesemia and hypophosphatemia. She is currently undergoing IV replacements as she is nauseated with oral supplementation.   Assessment & Plan:   Principal Problem:   Hypercalcemia Active Problems:   Hypertension   COPD (chronic obstructive pulmonary disease) (HCC)   GERD (gastroesophageal reflux disease)   Hyperparathyroidism (HCC)   Left hemiplegia (HCC)   Non-rheumatic mitral valve stenosis   Paroxysmal A-fib (HCC)   S/P mitral valve replacement with metallic valve   S/P TVR (tricuspid valve repair)   Hypokalemia   Hyponatremia   Normocytic anemia   Hypomagnesemia   Hypophosphatemia   Hypercalcemia would explain her generalized weakness, nausea and vomiting. Patient received a dose of Zometa and IV Lasix. Calcium has improved from 13 to 9. Discontinue lasix.     Primary hyperparathyroidism Associated with hypercalcemia and hypophosphatemia.  No indications for surgery at this time. Continue with cinacalcet. PTH wnl.    Hypophosphatemia Replaced. Repeat phos level is 1.8 - 1.7. IV potassium phosphate ordered .  Watch for refeeding syndrome.     Paroxysmal atrial fibrillation Rate controlled.  Continue with amiodarone and Coreg for rate control and  On Coumadin for anti coagulation.  supratherapeutic INR today. Hold coumadin tonight.  Repeat  INR tomorrow. No bleeding evident.     History of stroke with dysphagia and dysarthria Patient is on dysphagia 2 diet.  Dietary consult in place. G tube in place, but it is leaking and gen surgery / IR consulted for replacement.    Primary hypertension bp parameters are borderline low, discontinued the amlodipine and decreased the dose of coreg.    GERD Continue with PPI    COPD No wheezing heard at this time, continue with bronchodilators as needed.    Mild anemia of chronic disease.   History of mitral valve stenosis s/p MVR with mechanical wall and history of tricuspid valve repair Patient is currently on Coumadin for anticoagulation Last INR is supratherapeutic with a level of 4.8. no bleeding evident.     Patient reports left arm pain and back pain DVT is ruled out.     Back pain  X rays are unremarkable.   Nausea, vomiting.   abd x rays ruled out obstruction.    Enterococcus UTI:  Sensitive to ampicillin.   In view of her multiple medical problems, poor oral intake, dysphagia, clinical deterioration, palliative care consulted.   DVT prophylaxis:supratherapeutic inr, with coumadin.  Code Status: (Full code Family Communication: None at bedside Disposition:   Status is: Inpatient  Remains inpatient appropriate because:Ongoing diagnostic testing needed not appropriate for outpatient work up, Unsafe d/c plan and Inpatient level of care appropriate due to severity of illness   Dispo: The patient is from: Home              Anticipated d/c is to: Pending              Anticipated d/c date is: 1 day  Patient currently is not medically stable to d/c.       Consultants:   None.    Procedures: none   Antimicrobials: None    Subjective: Pt reports some nausea, no vomiting today. No abd pain.  No chest pain or sob.   Objective: Vitals:   07/17/20 1655 07/17/20 1956 07/17/20 2202 07/18/20 0528  BP: 118/65 (!) 97/43 (!) 106/54  (!) 93/46  Pulse: 72 71 72 72  Resp: 18 20  18   Temp:  98.4 F (36.9 C)  98.1 F (36.7 C)  TempSrc:  Oral  Oral  SpO2: 100% 100%  100%  Weight:      Height:        Intake/Output Summary (Last 24 hours) at 07/18/2020 0848 Last data filed at 07/18/2020 0700 Gross per 24 hour  Intake 3320.48 ml  Output 1600 ml  Net 1720.48 ml   Filed Weights   07/15/20 1922 07/16/20 0347  Weight: 71.7 kg 71.1 kg    Examination:  General exam:chronically ill appearing, not in distress.  Respiratory system: air entry fair, no wheezing or rhonchi.  Cardiovascular system: S1S2 heard, irregular, no JVD,  Gastrointestinal system: Abdomen is soft, non tender, bowel sounds wnl.  Central nervous system: alert and answering questions appropriately. Left side hemiparesis.  Extremities: left upper extremity swelling.  Skin: no rashes.  Psychiatry: Flat affect    Data Reviewed: I have personally reviewed following labs and imaging studies  CBC: Recent Labs  Lab 07/15/20 2107 07/16/20 0611  WBC 10.7* 7.4  NEUTROABS 7.6  --   HGB 11.3* 11.0*  HCT 37.5 37.5  MCV 87.0 87.8  PLT 343 263    Basic Metabolic Panel: Recent Labs  Lab 07/15/20 2107 07/16/20 0611 07/16/20 1322 07/17/20 1945 07/18/20 0030 07/18/20 0756  NA 134* 137 134*  --  138 139  K 3.2* 3.3* 3.5  --  2.4* 2.4*  CL 96* 102 105  --  107 108  CO2 25 22 19*  --  24 21*  GLUCOSE 96 88 81  --  78 75  BUN 26* 23 21  --  14 14  CREATININE 0.73 0.65 0.58  --  0.70 0.74  CALCIUM 14.5* 13.4* 11.9*  --  9.3 9.1  MG 1.4* 2.1  --  1.0* 1.0* 2.2  PHOS 1.9* 1.7*  --   --  2.0* 1.7*    GFR: Estimated Creatinine Clearance: 60.8 mL/min (by C-G formula based on SCr of 0.74 mg/dL).  Liver Function Tests: Recent Labs  Lab 07/15/20 2107 07/16/20 0611 07/18/20 0030 07/18/20 0756  AST 42* 35  --   --   ALT 17 15  --   --   ALKPHOS 92 87  --   --   BILITOT 1.5* 1.5*  --   --   PROT 8.0 7.2  --   --   ALBUMIN 3.5 3.2* 2.6* 2.5*     CBG: No results for input(s): GLUCAP in the last 168 hours.   Recent Results (from the past 240 hour(s))  Respiratory Panel by RT PCR (Flu A&B, Covid) - Urine, Clean Catch     Status: None   Collection Time: 07/15/20 11:18 PM   Specimen: Urine, Clean Catch; Nasopharyngeal  Result Value Ref Range Status   SARS Coronavirus 2 by RT PCR NEGATIVE NEGATIVE Final    Comment: (NOTE) SARS-CoV-2 target nucleic acids are NOT DETECTED.  The SARS-CoV-2 RNA is generally detectable in upper respiratoy specimens during the acute phase  of infection. The lowest concentration of SARS-CoV-2 viral copies this assay can detect is 131 copies/mL. A negative result does not preclude SARS-Cov-2 infection and should not be used as the sole basis for treatment or other patient management decisions. A negative result may occur with  improper specimen collection/handling, submission of specimen other than nasopharyngeal swab, presence of viral mutation(s) within the areas targeted by this assay, and inadequate number of viral copies (<131 copies/mL). A negative result must be combined with clinical observations, patient history, and epidemiological information. The expected result is Negative.  Fact Sheet for Patients:  https://www.moore.com/https://www.fda.gov/media/142436/download  Fact Sheet for Healthcare Providers:  https://www.young.biz/https://www.fda.gov/media/142435/download  This test is no t yet approved or cleared by the [place] FDA and  has been authorized for detection and/or diagnosis of SARS-CoV-2 by FDA under an Emergency Use Authorization (EUA). This EUA will remain  in effect (meaning this test can be used) for the duration of the COVID-19 declaration under Section 564(b)(1) of the Act, 21 U.S.C. section 360bbb-3(b)(1), unless the authorization is terminated or revoked sooner.     Influenza A by PCR NEGATIVE NEGATIVE Final   Influenza B by PCR NEGATIVE NEGATIVE Final    Comment: (NOTE) The Xpert Xpress  SARS-CoV-2/FLU/RSV assay is intended as an aid in  the diagnosis of influenza from Nasopharyngeal swab specimens and  should not be used as a sole basis for treatment. Nasal washings and  aspirates are unacceptable for Xpert Xpress SARS-CoV-2/FLU/RSV  testing.  Fact Sheet for Patients: https://www.moore.com/https://www.fda.gov/media/142436/download  Fact Sheet for Healthcare Providers: https://www.young.biz/https://www.fda.gov/media/142435/download  This test is not yet approved or cleared by the [place] FDA and  has been authorized for detection and/or diagnosis of SARS-CoV-2 by  FDA under an Emergency Use Authorization (EUA). This EUA will remain  in effect (meaning this test can be used) for the duration of the  Covid-19 declaration under Section 564(b)(1) of the Act, 21  U.S.C. section 360bbb-3(b)(1), unless the authorization is  terminated or revoked. Performed at [facility], [address] [place], [place] [address]   Urine culture     Status: Abnormal   Collection Time: 07/15/20 11:22 PM   Specimen: Urine, Clean Catch  Result Value Ref Range Status   Specimen Description   Final    URINE, CLEAN CATCH Performed at [facility], [address] [place], [place] [address]    Special Requests   Final    NONE Performed at [facility], [address] [place], [place] [address]    Culture >=100,000 COLONIES/mL ENTEROCOCCUS FAECALIS (A)  Final   Report Status 07/18/2020 FINAL  Final   Organism ID, Bacteria ENTEROCOCCUS FAECALIS (A)  Final      Susceptibility   Enterococcus faecalis - MIC*    AMPICILLIN <=2 SENSITIVE Sensitive     NITROFURANTOIN <=16 SENSITIVE Sensitive     VANCOMYCIN 1 SENSITIVE Sensitive     * >=100,000 COLONIES/mL ENTEROCOCCUS FAECALIS         Radiology Studies: DG Lumbar Spine 2-3 Views  Result Date: 07/17/2020 CLINICAL DATA:  Back pain EXAM: LUMBAR SPINE - 2-3 VIEW COMPARISON:  None. FINDINGS: Normal alignment. No fracture. Degenerative spurring in the mid and lower lumbar spine.  Mild degenerative facet disease in the lower lumbar spine. No fracture cleat that SI joints symmetric and unremarkable. Gastrojejunostomy tube in place. The tip appears to be in the proximal duodenum. IMPRESSION: No acute bony abnormality. Electronically Signed   By: Charlett NoseKevin  Dover M.D.   On: 07/17/2020 19:13  Scheduled Meds: . amiodarone  200 mg Oral Daily  . amoxicillin-clavulanate  1 tablet Oral BID  . aspirin EC  81 mg Oral Daily  . carvedilol  3.125 mg Oral BID WC  . cinacalcet  90 mg Oral Q breakfast  . feeding supplement  1 Container Oral BID BM  . mirtazapine  30 mg Oral QHS  . pantoprazole  40 mg Oral Daily  . phosphorus  250 mg Oral BID  . Warfarin - Pharmacist Dosing Inpatient   Does not apply q1600   Continuous Infusions: . sodium chloride 100 mL/hr at 07/18/20 0125  . potassium chloride    . potassium PHOSPHATE IVPB (in mmol)       LOS: 2 days       Kathlen Mody, MD [facility]   To contact the attending provider between 7A-7P or the covering provider during after hours 7P-7A, please log into the web site [email or web address] and access using universal [place] password for that web site. If you do not have the password, please call the hospital operator.  07/18/2020, 8:48 AM

## 2020-07-19 LAB — RENAL FUNCTION PANEL
Albumin: 2.6 g/dL — ABNORMAL LOW (ref 3.5–5.0)
Anion gap: 11 (ref 5–15)
BUN: 14 mg/dL (ref 8–23)
CO2: 22 mmol/L (ref 22–32)
Calcium: 8.3 mg/dL — ABNORMAL LOW (ref 8.9–10.3)
Chloride: 105 mmol/L (ref 98–111)
Creatinine, Ser: 0.64 mg/dL (ref 0.44–1.00)
GFR, Estimated: >60 mL/min (ref >60)
Glucose, Bld: 85 mg/dL (ref 70–99)
Phosphorus: 2.2 mg/dL — ABNORMAL LOW (ref 2.5–4.6)
Potassium: 2.9 mmol/L — ABNORMAL LOW (ref 3.5–5.1)
Sodium: 138 mmol/L (ref 135–145)

## 2020-07-19 MED ORDER — POLYETHYLENE GLYCOL 3350 17 G PO PACK
17.0000 g | PACK | Freq: Every day | ORAL | Status: DC
  Administered 2020-07-20 – 2020-07-24 (×3): 17 g via ORAL
  Filled 2020-07-19 (×2): qty 1

## 2020-07-19 MED ORDER — SENNOSIDES-DOCUSATE SODIUM 8.6-50 MG PO TABS
1.0000 | ORAL_TABLET | Freq: Two times a day (BID) | ORAL | Status: DC
  Administered 2020-07-19 – 2020-07-24 (×5): 1 via ORAL
  Filled 2020-07-19 (×6): qty 1

## 2020-07-19 MED ORDER — POTASSIUM PHOSPHATES 15 MMOLE/5ML IV SOLN
30.0000 mmol | Freq: Once | INTRAVENOUS | Status: AC
  Administered 2020-07-19: 30 mmol via INTRAVENOUS
  Filled 2020-07-19: qty 10

## 2020-07-19 NOTE — Progress Notes (Signed)
IR consulted by Dr. Blake Divine for possible image-guided gj tube exchange.  Per chart- tube placed at outside facility [facility]). Pam, PA-C from IR evaluated tube at bedside yesterday- g port leaking at distal end (small/minute cracks in tubing). Plan for image-guided gj tube exchange at [facility] IR on Monday 07/22/2020 at 0900. Patient will be NPO at midnight prior to procedure. Patient will be consented for procedure at [facility] on Monday. Alona Bene, RN aware to set up [facility]- arrive at 0800 for 0900 procedure (round trip).  Please call IR with questions/concerns.   Waylan Boga Cecile Guevara, PA-C 07/19/2020, 2:56 PM

## 2020-07-19 NOTE — Progress Notes (Signed)
  Speech Language Pathology Treatment: Dysphagia  Patient Details Name: Annette Rogers MRN: [id number] DOB: 1955/12/04 Today's Date: 07/19/2020 Time: 6378-5885 SLP Time Calculation (min) (ACUTE ONLY): 19 min  Assessment / Plan / Recommendation Clinical Impression  Ongoing diagnostic dysphagia therapy provided targeting limited trials of thin liquids. Pt refused solid and/or puree/pudding textures. RN reports Pt has been refusing all solids. Pt consumed thin liquids without overt s/sx of oropharyngeal dysphagia. ST will continue efforts.     HPI HPI: Annette Rogers is a 64 y.o. female with medical history significant of COPD, gastrojejunostomy, GERD, hyperlipidemia, hypercalcemia, hyperparathyroidism, hypertension, history of CVA with left-sided hemiplegia, nonrheumatic mitral valve stenosis with metallic valve replacement, history of tricuspid valve repair. Patient presented secondary to weakness and nausea and found to have significant hypercalcemia in relation to known primary hyperparathyroidism.      SLP Plan  Continue with current plan of care       Recommendations  Diet recommendations: Thin liquid;Dysphagia 2 (fine chop) Liquids provided via: Cup;Straw Medication Administration: Whole meds with puree Compensations: Slow rate;Small sips/bites;Follow solids with liquid Postural Changes and/or Swallow Maneuvers: Seated upright 90 degrees;Upright 30-60 min after meal                Oral Care Recommendations: Oral care BID;Staff/trained caregiver to provide oral care Follow up Recommendations: Skilled Nursing facility;24 hour supervision/assistance SLP Visit Diagnosis: Dysphagia, unspecified (R13.10) Plan: Continue with current plan of care       Mallerie Blok H. Romie Levee, CCC-SLP Speech Language Pathologist   Georgetta Haber 07/19/2020, 2:14 PM

## 2020-07-19 NOTE — TOC Progression Note (Signed)
Transition of Care [facility]) - Progression Note   Patient Details  Name: Annette Rogers MRN: [id number] Date of Birth: April 15, 1956  [facility] of [facility]) CM/SW Contact  Ewing Schlein, LCSW Phone Number: 07/19/2020, 12:03 PM  Clinical Narrative: CSW spoke with patient's daughter, Maxine Kamron Portee, regarding referral to OP palliative. Daughter agreeable to referral. CSW made OP palliative referral to [place] with [facility]. TOC to follow.  Expected Discharge Plan: Home w Home Health Services Barriers to Discharge: Continued Medical Work up  Expected Discharge Plan and Services Expected Discharge Plan: Home w Home Health Services In-house Referral: Clinical Social Work Post Acute Care Choice: Home Health Living arrangements for the past 2 months: Single Family Home  Readmission Risk Interventions No flowsheet data found.

## 2020-07-19 NOTE — Progress Notes (Signed)
PROGRESS NOTE    Annette Rogers  QQI:[id number] DOB: [birth year] DOA: 07/15/2020 PCP: Patient, No Pcp Per   Chief Complaint  Patient presents with  . Weakness    Brief Narrative:  Prior history of COPD, GERD, hyperlipidemia, hypercalcemia, hypertension, history of CVA with left-sided hemiplegia, hyperparathyroidism, mitral valve replacement, history of tricuspid valve repair presents to the hospital for generalized weakness associated with some nausea, vomiting.  She was found to have significant hypercalcemia in relation to known primary hyperparathyroidism, in addition to hypercalcemia, she had profound hypokalemia, hypomagnesemia and hypophosphatemia. She is currently undergoing IV replacements as she is nauseated with oral supplementation.   Assessment & Plan:   Principal Problem:   Hypercalcemia Active Problems:   Hypertension   COPD (chronic obstructive pulmonary disease) (HCC)   GERD (gastroesophageal reflux disease)   Hyperparathyroidism (HCC)   Left hemiplegia (HCC)   Non-rheumatic mitral valve stenosis   Paroxysmal A-fib (HCC)   S/P mitral valve replacement with metallic valve   S/P TVR (tricuspid valve repair)   Hypokalemia   Hyponatremia   Normocytic anemia   Hypomagnesemia   Hypophosphatemia   Hypercalcemia would explain her generalized weakness, nausea and vomiting. Patient received a dose of Zometa and IV Lasix. Calcium has improved from 13 to 9. Discontinue lasix.  Continue witH IV fluids as she is not taking by mouth.     Primary hyperparathyroidism Associated with hypercalcemia and hypophosphatemia.  No indications for surgery at this time. Continue with cinacalcet. PTH wnl.    Hypophosphatemia Replaced. Repeat phos level is 2.6  IV potassium phosphate ordered .  Watch for refeeding syndrome.     Paroxysmal atrial fibrillation Rate controlled.  Continue with amiodarone and Coreg for rate control and  On Coumadin for anti coagulation.    supratherapeutic INR today. Hold coumadin tonight.  Repeat INR tomorrow. No bleeding evident.     History of stroke with dysphagia and dysarthria and left hemiplegia Patient is on dysphagia 2 diet.  Dietary consult in place. PEJ in place, but it is leaking and gen surgery / IR consulted for replacement.   Plan for replacement scheduled on Monday at [facility]. Meanwhile family is trying to convince her to go to SNF.  Son at bedside.    Primary hypertension bp parameters are borderline low, discontinued the amlodipine and decreased the dose of coreg.    GERD Continue with PPI    COPD No wheezing heard at this time, continue with bronchodilators as needed.    Mild anemia of chronic disease.   History of mitral valve stenosis s/p MVR with mechanical wall and history of tricuspid valve repair Patient is currently on Coumadin for anticoagulation Last INR is supratherapeutic with a level of 5.2 , no bleeding evident. No indication for vitamin K.     Patient reports left arm pain and back pain DVT is ruled out.  Pain control.     Back pain  X rays are unremarkable.   Nausea, vomiting.   abd x rays ruled out obstruction.    Enterococcus UTI:  Sensitive to ampicillin. Started her on ampicillin   In view of her multiple medical problems, poor oral intake, dysphagia, clinical deterioration, palliative care consulted.   DVT prophylaxis:supratherapeutic inr, with coumadin.  Code Status: (Full code Family Communication: son at bedside.  Disposition:   Status is: Inpatient  Remains inpatient appropriate because:Ongoing diagnostic testing needed not appropriate for outpatient work up, Unsafe d/c plan and Inpatient level of care appropriate due to severity of illness  Dispo: The patient is from: Home              Anticipated d/c is to: Pending              Anticipated d/c date is: 3 days              Patient currently is not medically stable to d/c.        Consultants:   IR  PALLIATIVE CARE.    Procedures: none   Antimicrobials: None    Subjective: No new complaints.   Objective: Vitals:   07/18/20 1811 07/18/20 2018 07/19/20 0454 07/19/20 1318  BP: 113/64 (!) 98/58 (!) 114/56 126/60  Pulse: 71 75 81 87  Resp: 16 18 17 18   Temp: (!) 97.5 F (36.4 C) 98.9 F (37.2 C) 98.4 F (36.9 C) 98.8 F (37.1 C)  TempSrc:  Oral  Oral  SpO2: 100% 100% 100% (!) 50%  Weight:      Height:        Intake/Output Summary (Last 24 hours) at 07/19/2020 1429 Last data filed at 07/19/2020 1345 Gross per 24 hour  Intake 608 ml  Output 1050 ml  Net -442 ml   Filed Weights   07/15/20 1922 07/16/20 0347  Weight: 71.7 kg 71.1 kg    Examination:  General exam: alert and appears comfortable.  Respiratory system: clear to auscultation, no wheezing or rhonchi.  Cardiovascular system: S1-S2 heard, regular rate rhythm, no JVD Gastrointestinal system: Abdomen is soft, nontender, bowel sounds normal.  Central nervous system: Alert and oriented, left hemiplegia Extremities: left upper extremity swelling.  Left lower extremity swelling compared to the right lower extremity due to paresis. Skin: no rashes.  Psychiatry: Flat affect    Data Reviewed: I have personally reviewed following labs and imaging studies  CBC: Recent Labs  Lab 07/15/20 2107 07/16/20 0611 07/18/20 2151  WBC 10.7* 7.4 7.2  NEUTROABS 7.6  --   --   HGB 11.3* 11.0* 8.6*  HCT 37.5 37.5 27.7*  MCV 87.0 87.8 86.3  PLT 343 263 242    Basic Metabolic Panel: Recent Labs  Lab 07/15/20 2107 07/15/20 2107 07/16/20 0611 07/16/20 0611 07/16/20 1322 07/17/20 1945 07/18/20 0030 07/18/20 0756 07/18/20 2151 07/19/20 0706  NA 134*   < > 137   < > 134*  --  138 140  139 135 138  K 3.2*   < > 3.3*   < > 3.5  --  2.4* 2.5*  2.4* 3.0* 2.9*  CL 96*   < > 102   < > 105  --  107 108  108 104 105  CO2 25   < > 22   < > 19*  --  24 20*  21* 23 22  GLUCOSE 96   < > 88   <  > 81  --  78 72  75 113* 85  BUN 26*   < > 23   < > 21  --  14 14  14 14 14   CREATININE 0.73   < > 0.65   < > 0.58  --  0.70 0.67  0.74 0.77 0.64  CALCIUM 14.5*   < > 13.4*   < > 11.9*  --  9.3 9.3  9.1 8.4* 8.3*  MG 1.4*  --  2.1  --   --  1.0* 1.0* 2.2  --   --   PHOS 1.9*   < > 1.7*  --   --   --  2.0* 1.8*  1.7* 4.2 2.2*   < > = values in this interval not displayed.    GFR: Estimated Creatinine Clearance: 60.8 mL/min (by C-G formula based on SCr of 0.64 mg/dL).  Liver Function Tests: Recent Labs  Lab 07/15/20 2107 07/16/20 0611 07/18/20 0030 07/18/20 0756 07/19/20 0706  AST 42* 35  --   --   --   ALT 17 15  --   --   --   ALKPHOS 92 87  --   --   --   BILITOT 1.5* 1.5*  --   --   --   PROT 8.0 7.2  --   --   --   ALBUMIN 3.5 3.2* 2.6* 2.5* 2.6*    CBG: No results for input(s): GLUCAP in the last 168 hours.   Recent Results (from the past 240 hour(s))  Respiratory Panel by RT PCR (Flu A&B, Covid) - Urine, Clean Catch     Status: None   Collection Time: 07/15/20 11:18 PM   Specimen: Urine, Clean Catch; Nasopharyngeal  Result Value Ref Range Status   SARS Coronavirus 2 by RT PCR NEGATIVE NEGATIVE Final    Comment: (NOTE) SARS-CoV-2 target nucleic acids are NOT DETECTED.  The SARS-CoV-2 RNA is generally detectable in upper respiratoy specimens during the acute phase of infection. The lowest concentration of SARS-CoV-2 viral copies this assay can detect is 131 copies/mL. A negative result does not preclude SARS-Cov-2 infection and should not be used as the sole basis for treatment or other patient management decisions. A negative result may occur with  improper specimen collection/handling, submission of specimen other than nasopharyngeal swab, presence of viral mutation(s) within the areas targeted by this assay, and inadequate number of viral copies (<131 copies/mL). A negative result must be combined with clinical observations, patient history, and  epidemiological information. The expected result is Negative.  Fact Sheet for Patients:  https://www.moore.com/  Fact Sheet for Healthcare Providers:  https://www.young.biz/  This test is no t yet approved or cleared by the [place] FDA and  has been authorized for detection and/or diagnosis of SARS-CoV-2 by FDA under an Emergency Use Authorization (EUA). This EUA will remain  in effect (meaning this test can be used) for the duration of the COVID-19 declaration under Section 564(b)(1) of the Act, 21 U.S.C. section 360bbb-3(b)(1), unless the authorization is terminated or revoked sooner.     Influenza A by PCR NEGATIVE NEGATIVE Final   Influenza B by PCR NEGATIVE NEGATIVE Final    Comment: (NOTE) The Xpert Xpress SARS-CoV-2/FLU/RSV assay is intended as an aid in  the diagnosis of influenza from Nasopharyngeal swab specimens and  should not be used as a sole basis for treatment. Nasal washings and  aspirates are unacceptable for Xpert Xpress SARS-CoV-2/FLU/RSV  testing.  Fact Sheet for Patients: https://www.moore.com/  Fact Sheet for Healthcare Providers: https://www.young.biz/  This test is not yet approved or cleared by the [place] FDA and  has been authorized for detection and/or diagnosis of SARS-CoV-2 by  FDA under an Emergency Use Authorization (EUA). This EUA will remain  in effect (meaning this test can be used) for the duration of the  Covid-19 declaration under Section 564(b)(1) of the Act, 21  U.S.C. section 360bbb-3(b)(1), unless the authorization is  terminated or revoked. Performed at [facility], [address] [place], [place] [address]   Urine culture     Status: Abnormal   Collection Time: 07/15/20 11:22 PM   Specimen: Urine, Clean Catch  Result Value Ref Range Status   Specimen Description   Final    URINE, CLEAN CATCH Performed at [facility], [address] [place], [place] [address]    Special Requests   Final    NONE Performed at [facility], [address] [place], [place] [address]    Culture >=100,000 COLONIES/mL ENTEROCOCCUS FAECALIS (A)  Final   Report Status 07/18/2020 FINAL  Final   Organism ID, Bacteria ENTEROCOCCUS FAECALIS (A)  Final      Susceptibility   Enterococcus faecalis - MIC*    AMPICILLIN <=2 SENSITIVE Sensitive     NITROFURANTOIN <=16 SENSITIVE Sensitive     VANCOMYCIN 1 SENSITIVE Sensitive     * >=100,000 COLONIES/mL ENTEROCOCCUS FAECALIS         Radiology Studies: DG Lumbar Spine 2-3 Views  Result Date: 07/17/2020 CLINICAL DATA:  Back pain EXAM: LUMBAR SPINE - 2-3 VIEW COMPARISON:  None. FINDINGS: Normal alignment. No fracture. Degenerative spurring in the mid and lower lumbar spine. Mild degenerative facet disease in the lower lumbar spine. No fracture cleat that SI joints symmetric and unremarkable. Gastrojejunostomy tube in place. The tip appears to be in the proximal duodenum. IMPRESSION: No acute bony abnormality. Electronically Signed   By: Charlett Nose M.D.   On: 07/17/2020 19:13   US Venous Img Upper Uni Left (DVT)  Result Date: 07/18/2020 CLINICAL DATA:  Left upper extremity pain and edema. Patient is currently on anticoagulation. Evaluate for DVT. EXAM: LEFT UPPER EXTREMITY VENOUS DOPPLER ULTRASOUND TECHNIQUE: Gray-scale sonography with graded compression, as well as color Doppler and duplex ultrasound were performed to evaluate the upper extremity deep venous system from the level of the subclavian vein and including the jugular, axillary, basilic, radial, ulnar and upper cephalic vein. Spectral Doppler was utilized to evaluate flow at rest and with distal augmentation maneuvers. COMPARISON:  None. FINDINGS: Contralateral Subclavian Vein: Respiratory phasicity is normal and symmetric with the symptomatic side. No evidence of thrombus. Normal compressibility. Internal Jugular Vein: No evidence of thrombus.  Normal compressibility, respiratory phasicity and response to augmentation. Subclavian Vein: No evidence of thrombus. Normal compressibility, respiratory phasicity and response to augmentation. Axillary Vein: No evidence of thrombus. Normal compressibility, respiratory phasicity and response to augmentation. Cephalic Vein: No evidence of thrombus. Normal compressibility, respiratory phasicity and response to augmentation. Basilic Vein: No evidence of thrombus. Normal compressibility, respiratory phasicity and response to augmentation. Brachial Veins: No evidence of thrombus. Normal compressibility, respiratory phasicity and response to augmentation. Radial Veins: No evidence of thrombus. Normal compressibility, respiratory phasicity and response to augmentation. Ulnar Veins: No evidence of thrombus. Normal compressibility, respiratory phasicity and response to augmentation. Venous Reflux:  None visualized. Other Findings:  None visualized. IMPRESSION: No evidence of DVT within the left upper extremity. Electronically Signed   By: Simonne Come M.D.   On: 07/18/2020 10:57   DG Abd 2 Views  Result Date: 07/18/2020 CLINICAL DATA:  63 year old female with weakness nausea and vomiting. EXAM: ABDOMEN - 2 VIEW COMPARISON:  Lumbar radiographs yesterday. FINDINGS: Portable upright and supine views of the abdomen and pelvis. Percutaneous enteric tube redemonstrated in the midline. A single surgical clip projects in the right lower abdomen. No pneumoperitoneum. Non obstructed bowel gas pattern. Abdominal and pelvic visceral contours are within normal limits; the urinary bladder might be distended. Cardiomegaly. Prior cardiac valve replacement. Negative lung bases. No acute osseous abnormality identified. IMPRESSION: 1. Non obstructed bowel gas pattern, no free air. 2. Percutaneous enteric tube. 3. Cardiomegaly. Previous cardiac valve  replacement. Electronically Signed   By: Odessa Fleming M.D.   On: 07/18/2020 12:34         Scheduled Meds: . amiodarone  200 mg Oral Daily  . aspirin EC  81 mg Oral Daily  . carvedilol  3.125 mg Oral BID WC  . cinacalcet  90 mg Oral Q breakfast  . feeding supplement  1 Container Oral BID BM  . mirtazapine  30 mg Oral QHS  . pantoprazole  40 mg Oral Daily  . phosphorus  250 mg Oral BID  . Warfarin - Pharmacist Dosing Inpatient   Does not apply q1600   Continuous Infusions: . sodium chloride 100 mL/hr at 07/19/20 0903  . ampicillin (OMNIPEN) IV 1 g (07/19/20 1328)  . potassium PHOSPHATE IVPB (in mmol)       LOS: 3 days       Kathlen Mody, MD [facility]   To contact the attending provider between 7A-7P or the covering provider during after hours 7P-7A, please log into the web site [email or web address] and access using universal [place] password for that web site. If you do not have the password, please call the hospital operator.  07/19/2020, 2:29 PM

## 2020-07-19 NOTE — Progress Notes (Signed)
CRITICAL VALUE ALERT  Critical Value:  INR 5.2   Date & Time Notied:  07/18/20 2305   Provider Notified: X. Blount  Orders Received/Actions taken: No new orders

## 2020-07-19 NOTE — Progress Notes (Signed)
Nutrition Follow-up  DOCUMENTATION CODES:   Obesity unspecified  INTERVENTION:  Recommend consider starting  D5   RD will continue to follow and make further rec's once patient is cleared to resume tube feeds   NUTRITION DIAGNOSIS:   Inadequate oral intake related to nausea as evidenced by per patient/family report, meal completion < 50%.  Ongoing. Poor meal and oral supplement acceptance  GOAL:   Patient will meet greater than or equal to 90% of their needs  Not met. Patient has very poor oral intake. Plans are for tube exchange on Monday.  MONITOR:   Labs, Supplement acceptance, PO intake, Weight trends, I & O's  REASON FOR ASSESSMENT:   Consult Assessment of nutrition requirement/status  ASSESSMENT:   64 year old female with medical history significant of COPD, gastrojejunostomy, GERD, HLD, hypercalcemia, hyperparathyroidism, HTN, history of CVA with left-sided hemiplegia, non-rheumatic mitral valve stenosis with metallic valve replacement, s/p tricuspid repair, and recently discharged with home health s/p admission for right MCA stroke presents with 1 week history of feeling nauseas and weakness. Pt admitted with hypercalcemia.  11/5- Talked with nurse today.Patient is not drinking the Boost breeze and she has minimal meal intake. No complaints of nausea today. Son is at bedside and we talked about her food preferences. Patient likes "soul food" chicken livers in particular, sausage biscuits. She has a cola at bedside but says she has primarily been drinking water. Encouraged her to work with nutrition staff to order foods that would be acceptable for her. She complains that she has a coating on her tongue and food is not "tasting right". Per IR note-G-port is leaking and there are plans for image-guided gj tube exchange at [facility] on Monday 07/22/2020 at 0900. RD will continue to follow and make further recommendations once tube is replaced.  Medications  reviewed.  Labs: BMP Latest Ref Rng & Units 07/19/2020 07/18/2020 07/18/2020  Glucose 70 - 99 mg/dL 85 113(H) 72  BUN 8 - 23 mg/dL 14 14 14   Creatinine 0.44 - 1.00 mg/dL 0.64 0.77 0.67  Sodium 135 - 145 mmol/L 138 135 140  Potassium 3.5 - 5.1 mmol/L 2.9(L) 3.0(L) 2.5(LL)  Chloride 98 - 111 mmol/L 105 104 108  CO2 22 - 32 mmol/L 22 23 20(L)  Calcium 8.9 - 10.3 mg/dL 8.3(L) 8.4(L) 9.3     Diet Order:   Diet Order            Diet NPO time specified Except for: Sips with Meds  Diet effective midnight           DIET DYS 2 Room service appropriate? Yes; Fluid consistency: Thin  Diet effective now                 EDUCATION NEEDS:   Education needs have been addressed  Skin:  Skin Assessment: Reviewed RN Assessment  Last BM:  Unknown   Height:   Ht Readings from Last 1 Encounters:  07/16/20 5' (1.524 m)    Weight:   Wt Readings from Last 1 Encounters:  07/16/20 71.1 kg    Ideal Body Weight:   45 kg  BMI:  Body mass index is 30.61 kg/m.  Estimated Nutritional Needs:   Kcal:  1900-2100  Protein:  90-105  Fluid:  >/= 1.9 L/day   Colman Cater MS,RD,CSG,LDN Pager: Shea Evans

## 2020-07-19 NOTE — Progress Notes (Signed)
ANTICOAGULATION CONSULT NOTE -   Pharmacy Consult for Coumadin Indication: MVR and Afib  No Known Allergies  Patient Measurements: Height: 5' (152.4 cm) Weight: 71.1 kg (156 lb 12 oz) IBW/kg (Calculated) : 45.5  Vital Signs: Temp: 98.4 F (36.9 C) (11/05 0454) BP: 114/56 (11/05 0454) Pulse Rate: 81 (11/05 0454)  Labs: Recent Labs    07/18/20 0030 07/18/20 0030 07/18/20 0756 07/18/20 2151 07/19/20 0706  HGB  --   --   --  8.6*  --   HCT  --   --   --  27.7*  --   PLT  --   --   --  242  --   LABPROT 43.8*  --  43.8* 46.1*  --   INR 4.8*  --  4.8* 5.2*  --   CREATININE 0.70   < > 0.67  0.74 0.77 0.64   < > = values in this interval not displayed.    Estimated Creatinine Clearance: 60.8 mL/min (by C-G formula based on SCr of 0.64 mg/dL).   Assessment: 64yo female c/o weakness and nausea x1wk, admitted for hypercalcemia, to continue Coumadin for MVR/Afib.   Home dose: warfarin 2mg  daily Last dose:  07/14/20 Drug Interactions (major): amiodarone 200mg  daily  07/19/20 1100 UPDATE INR: 5.2->continue to hold warfarin Pt is refusing PO/NG intake, so this will affect INR CBC:  Hb 11.2   Plates: RN reports no s/s of bleeding at this time   Goal of Therapy:  INR 2.5-3.5   Plan:  Hold warfarin again  today for INR 5.2 F/U re-start of warfarin when INR back in range-->may need to lower dose d/t amiodarone interaction and reduced PO intake Daily INR and every other day CBC Monitor for S/S of bleeding  13/5/21, Pharm. D. Clinical Pharmacist 07/19/2020 12:57 PM

## 2020-07-20 LAB — COMPREHENSIVE METABOLIC PANEL
ALT: 18 U/L (ref 0–44)
AST: 38 U/L (ref 15–41)
Albumin: 2.8 g/dL — ABNORMAL LOW (ref 3.5–5.0)
Alkaline Phosphatase: 86 U/L (ref 38–126)
Anion gap: 12 (ref 5–15)
BUN: 9 mg/dL (ref 8–23)
CO2: 21 mmol/L — ABNORMAL LOW (ref 22–32)
Calcium: 7.8 mg/dL — ABNORMAL LOW (ref 8.9–10.3)
Chloride: 103 mmol/L (ref 98–111)
Creatinine, Ser: 0.6 mg/dL (ref 0.44–1.00)
GFR, Estimated: >60 mL/min (ref >60)
Glucose, Bld: 86 mg/dL (ref 70–99)
Potassium: 2.9 mmol/L — ABNORMAL LOW (ref 3.5–5.1)
Sodium: 136 mmol/L (ref 135–145)
Total Bilirubin: 0.9 mg/dL (ref 0.3–1.2)
Total Protein: 6.4 g/dL — ABNORMAL LOW (ref 6.5–8.1)

## 2020-07-20 LAB — PROTIME-INR
INR: 3.6 — ABNORMAL HIGH (ref 0.8–1.2)
Prothrombin Time: 34.5 seconds — ABNORMAL HIGH (ref 11.4–15.2)

## 2020-07-20 LAB — PHOSPHORUS: Phosphorus: 2.1 mg/dL — ABNORMAL LOW (ref 2.5–4.6)

## 2020-07-20 LAB — MAGNESIUM: Magnesium: 1.2 mg/dL — ABNORMAL LOW (ref 1.7–2.4)

## 2020-07-20 MED ORDER — MAGNESIUM SULFATE IN D5W 1-5 GM/100ML-% IV SOLN
1.0000 g | INTRAVENOUS | Status: DC
  Filled 2020-07-20 (×3): qty 100

## 2020-07-20 MED ORDER — POTASSIUM CHLORIDE 20 MEQ/15ML (10%) PO SOLN
40.0000 meq | Freq: Two times a day (BID) | ORAL | Status: DC
  Administered 2020-07-20 – 2020-07-23 (×6): 40 meq via ORAL
  Filled 2020-07-20 (×7): qty 30

## 2020-07-20 MED ORDER — POTASSIUM PHOSPHATES 15 MMOLE/5ML IV SOLN
30.0000 mmol | Freq: Once | INTRAVENOUS | Status: AC
  Administered 2020-07-20: 30 mmol via INTRAVENOUS
  Filled 2020-07-20: qty 10

## 2020-07-20 MED ORDER — POTASSIUM CHLORIDE CRYS ER 20 MEQ PO TBCR
40.0000 meq | EXTENDED_RELEASE_TABLET | Freq: Two times a day (BID) | ORAL | Status: DC
  Filled 2020-07-20: qty 2

## 2020-07-20 MED ORDER — MAGNESIUM SULFATE IN D5W 1-5 GM/100ML-% IV SOLN
1.0000 g | INTRAVENOUS | Status: AC
  Administered 2020-07-21 (×2): 1 g via INTRAVENOUS
  Filled 2020-07-20 (×3): qty 100

## 2020-07-20 MED ORDER — POTASSIUM PHOSPHATES 15 MMOLE/5ML IV SOLN
30.0000 mmol | Freq: Once | INTRAVENOUS | Status: DC
  Filled 2020-07-20: qty 10

## 2020-07-20 NOTE — Progress Notes (Signed)
ANTICOAGULATION CONSULT NOTE -   Pharmacy Consult for Coumadin Indication: MVR and Afib  No Known Allergies  Patient Measurements: Height: 5' (152.4 cm) Weight: 71.1 kg (156 lb 12 oz) IBW/kg (Calculated) : 45.5  Vital Signs: Temp: 98 F (36.7 C) (11/06 0400) BP: 124/72 (11/06 0400) Pulse Rate: 82 (11/06 0400)  Labs: Recent Labs    07/18/20 0756 07/18/20 0756 07/18/20 2151 07/19/20 0706 07/20/20 0647  HGB  --   --  8.6*  --   --   HCT  --   --  27.7*  --   --   PLT  --   --  242  --   --   LABPROT 43.8*  --  46.1*  --  34.5*  INR 4.8*  --  5.2*  --  3.6*  CREATININE 0.67  0.74   < > 0.77 0.64 0.60   < > = values in this interval not displayed.    Estimated Creatinine Clearance: 60.8 mL/min (by C-G formula based on SCr of 0.6 mg/dL).   Assessment: 64yo female c/o weakness and nausea x1wk, admitted for hypercalcemia, to continue Coumadin for MVR/Afib.   Home dose: warfarin 2mg  daily Last dose:  07/14/20 Drug Interactions (major): amiodarone 200mg  daily  07/20/20 1000 UPDATE INR:3.6->continue to hold warfarin Pt is refusing PO/NG intake, so this will affect INR CBC:  Hb 8.6    Plates: RN reports no s/s of bleeding at this time  Goal of Therapy:  INR 2.5-3.5   Plan:  Hold warfarin again  today for INR 3.6 F/U re-start of warfarin when INR back in range-->may need to lower dose d/t amiodarone interaction and reduced PO intake Daily INR and every other day CBC Monitor for S/S of bleeding  13/6/21, Pharm. D. Clinical Pharmacist 07/20/2020 9:46 AM

## 2020-07-20 NOTE — Progress Notes (Signed)
PROGRESS NOTE    Annette Rogers  NFA:[id number] DOB: [birth year] DOA: 07/15/2020 PCP: Patient, No Pcp Per   Chief Complaint  Patient presents with  . Weakness    Brief Narrative:  Prior history of COPD, GERD, hyperlipidemia, hypercalcemia, hypertension, history of CVA with left-sided hemiplegia, hyperparathyroidism, mitral valve replacement, history of tricuspid valve repair presents to the hospital for generalized weakness associated with some nausea, vomiting.  She was found to have significant hypercalcemia in relation to known primary hyperparathyroidism, in addition to hypercalcemia, she had profound hypokalemia, hypomagnesemia and hypophosphatemia. She is currently undergoing IV replacements as she is nauseated with oral supplementation.  Pt seen and examined today, she appears better today, eating her breakfast.  Assessment & Plan:   Principal Problem:   Hypercalcemia Active Problems:   Hypertension   COPD (chronic obstructive pulmonary disease) (HCC)   GERD (gastroesophageal reflux disease)   Hyperparathyroidism (HCC)   Left hemiplegia (HCC)   Non-rheumatic mitral valve stenosis   Paroxysmal A-fib (HCC)   S/P mitral valve replacement with metallic valve   S/P TVR (tricuspid valve repair)   Hypokalemia   Hyponatremia   Normocytic anemia   Hypomagnesemia   Hypophosphatemia   Hypercalcemia would explain her generalized weakness, nausea and vomiting. Patient received a dose of Zometa and IV Lasix. Calcium has improved from 13 to 9. Discontinue lasix.  Continue with IV fluids as she has poor oral intake by mouth.     Primary hyperparathyroidism Associated with hypercalcemia and hypophosphatemia.  No indications for surgery at this time. Continue with cinacalcet. PTH wnl.    Hypophosphatemia Replaced. Repeat phos level is 2.1  IV potassium phosphate ordered .  Watch for refeeding syndrome.     Paroxysmal atrial fibrillation Rate controlled.  Continue with  amiodarone and Coreg for rate control and  On Coumadin for anti coagulation.  Therapeutic iNR today.  Repeat INR tomorrow. No bleeding evident.     History of stroke with dysphagia and dysarthria and left hemiplegia Patient is on dysphagia 2 diet.  Dietary consult in place. PEJ in place, but it is leaking and gen surgery / IR consulted for replacement.  Plan for  PEJ replacement by IR scheduled on Monday at [facility].    Primary hypertension BP parameters are wnl.    GERD Continue with PPI    COPD No wheezing heard at this time, continue with bronchodilators as needed.    Mild anemia of chronic disease.   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      Patient reports left arm pain and back pain DVT is ruled out.  Pain control.     Back pain  X rays are unremarkable.   Nausea, vomiting.  Resolved.   abd x rays ruled out obstruction.    Enterococcus UTI:  Sensitive to ampicillin. Started her on ampicillin  Complete the course of 5 days of antibiotics.   In view of her multiple medical problems, poor oral intake, dysphagia, clinical deterioration, palliative care consulted.   DVT prophylaxis:coumadin.  Code Status: (Full code Family Communication: discussed with daughter over the phone.  Disposition:   Status is: Inpatient  Remains inpatient appropriate because:Ongoing diagnostic testing needed not appropriate for outpatient work up, Unsafe d/c plan and Inpatient level of care appropriate due to severity of illness   Dispo: The patient is from: Home  Anticipated d/c is to: Home              Anticipated d/c date is: 3 days              Patient currently is not medically stable to d/c.       Consultants:   IR  PALLIATIVE CARE.    Procedures: none   Antimicrobials: None    Subjective: Nausea has  improved, no abd pain, no chest pain or sob.  Objective: Vitals:   07/19/20 0454 07/19/20 1318 07/19/20 1952 07/20/20 0400  BP: (!) 114/56 126/60 127/79 124/72  Pulse: 81 87 90 82  Resp: 17 18 16 16   Temp: 98.4 F (36.9 C) 98.8 F (37.1 C) 98.5 F (36.9 C) 98 F (36.7 C)  TempSrc:  Oral Oral   SpO2: 100% (!) 50% 100% 100%  Weight:      Height:        Intake/Output Summary (Last 24 hours) at 07/20/2020 1456 Last data filed at 07/20/2020 0600 Gross per 24 hour  Intake 1663.96 ml  Output 700 ml  Net 963.96 ml   Filed Weights   07/15/20 1922 07/16/20 0347  Weight: 71.7 kg 71.1 kg    Examination:  General exam: alert and comfortable.  Respiratory system: clear to auscultation, no wheezing or rhonchi.  Cardiovascular system: S1S2 no JVD,  Gastrointestinal system: Abdomen is soft, NT BS + Central nervous system: Alert and oriented, left hemiplegia Extremities: left hemiplegia, mild swelling of the left foot.  Skin:  No rashes.  Psychiatry: Flat affect    Data Reviewed: I have personally reviewed following labs and imaging studies  CBC: Recent Labs  Lab 07/15/20 2107 07/16/20 0611 07/18/20 2151  WBC 10.7* 7.4 7.2  NEUTROABS 7.6  --   --   HGB 11.3* 11.0* 8.6*  HCT 37.5 37.5 27.7*  MCV 87.0 87.8 86.3  PLT 343 263 242    Basic Metabolic Panel: Recent Labs  Lab 07/16/20 0611 07/16/20 1322 07/17/20 1945 07/18/20 0030 07/18/20 0756 07/18/20 2151 07/19/20 0706 07/20/20 0647  NA 137   < >  --  138 140  139 135 138 136  K 3.3*   < >  --  2.4* 2.5*  2.4* 3.0* 2.9* 2.9*  CL 102   < >  --  107 108  108 104 105 103  CO2 22   < >  --  24 20*  21* 23 22 21*  GLUCOSE 88   < >  --  78 72  75 113* 85 86  BUN 23   < >  --  14 14  14 14 14 9   CREATININE 0.65   < >  --  0.70 0.67  0.74 0.77 0.64 0.60  CALCIUM 13.4*   < >  --  9.3 9.3  9.1 8.4* 8.3* 7.8*  MG 2.1  --  1.0* 1.0* 2.2  --   --  1.2*  PHOS 1.7*  --   --  2.0* 1.8*  1.7* 4.2 2.2* 2.1*   < > =  values in this interval not displayed.    GFR: Estimated Creatinine Clearance: 60.8 mL/min (by C-G formula based on SCr of 0.6 mg/dL).  Liver Function Tests: Recent Labs  Lab 07/15/20 2107 07/15/20 2107 07/16/20 0611 07/18/20 0030 07/18/20 0756 07/19/20 0706 07/20/20 0647  AST 42*  --  35  --   --   --  38  ALT 17  --  15  --   --   --  18  ALKPHOS 92  --  87  --   --   --  86  BILITOT 1.5*  --  1.5*  --   --   --  0.9  PROT 8.0  --  7.2  --   --   --  6.4*  ALBUMIN 3.5   < > 3.2* 2.6* 2.5* 2.6* 2.8*   < > = values in this interval not displayed.    CBG: No results for input(s): GLUCAP in the last 168 hours.   Recent Results (from the past 240 hour(s))  Respiratory Panel by RT PCR (Flu A&B, Covid) - Urine, Clean Catch     Status: None   Collection Time: 07/15/20 11:18 PM   Specimen: Urine, Clean Catch; Nasopharyngeal  Result Value Ref Range Status   SARS Coronavirus 2 by RT PCR NEGATIVE NEGATIVE Final    Comment: (NOTE) SARS-CoV-2 target nucleic acids are NOT DETECTED.  The SARS-CoV-2 RNA is generally detectable in upper respiratoy specimens during the acute phase of infection. The lowest concentration of SARS-CoV-2 viral copies this assay can detect is 131 copies/mL. A negative result does not preclude SARS-Cov-2 infection and should not be used as the sole basis for treatment or other patient management decisions. A negative result may occur with  improper specimen collection/handling, submission of specimen other than nasopharyngeal swab, presence of viral mutation(s) within the areas targeted by this assay, and inadequate number of viral copies (<131 copies/mL). A negative result must be combined with clinical observations, patient history, and epidemiological information. The expected result is Negative.  Fact Sheet for Patients:  https://www.moore.com/  Fact Sheet for Healthcare Providers:   https://www.young.biz/  This test is no t yet approved or cleared by the [place] FDA and  has been authorized for detection and/or diagnosis of SARS-CoV-2 by FDA under an Emergency Use Authorization (EUA). This EUA will remain  in effect (meaning this test can be used) for the duration of the COVID-19 declaration under Section 564(b)(1) of the Act, 21 U.S.C. section 360bbb-3(b)(1), unless the authorization is terminated or revoked sooner.     Influenza A by PCR NEGATIVE NEGATIVE Final   Influenza B by PCR NEGATIVE NEGATIVE Final    Comment: (NOTE) The Xpert Xpress SARS-CoV-2/FLU/RSV assay is intended as an aid in  the diagnosis of influenza from Nasopharyngeal swab specimens and  should not be used as a sole basis for treatment. Nasal washings and  aspirates are unacceptable for Xpert Xpress SARS-CoV-2/FLU/RSV  testing.  Fact Sheet for Patients: https://www.moore.com/  Fact Sheet for Healthcare Providers: https://www.young.biz/  This test is not yet approved or cleared by the [place] FDA and  has been authorized for detection and/or diagnosis of SARS-CoV-2 by  FDA under an Emergency Use Authorization (EUA). This EUA will remain  in effect (meaning this test can be used) for the duration of the  Covid-19 declaration under Section 564(b)(1) of the Act, 21  U.S.C. section 360bbb-3(b)(1), unless the authorization is  terminated or revoked. Performed at [facility], [address] [place], [place] [address]   Urine culture     Status: Abnormal   Collection Time: 07/15/20 11:22 PM   Specimen: Urine, Clean Catch  Result Value Ref Range Status   Specimen Description   Final    URINE, CLEAN CATCH Performed at [facility], [address], [place], [place] [address]    Special Requests   Final    NONE Performed at [facility], [address] [place],  [place] [address]    Culture >=100,000 COLONIES/mL  ENTEROCOCCUS FAECALIS (A)  Final   Report Status 07/18/2020 FINAL  Final   Organism ID, Bacteria ENTEROCOCCUS FAECALIS (A)  Final      Susceptibility   Enterococcus faecalis - MIC*    AMPICILLIN <=2 SENSITIVE Sensitive     NITROFURANTOIN <=16 SENSITIVE Sensitive     VANCOMYCIN 1 SENSITIVE Sensitive     * >=100,000 COLONIES/mL ENTEROCOCCUS FAECALIS         Radiology Studies: No results found.      Scheduled Meds: . amiodarone  200 mg Oral Daily  . aspirin EC  81 mg Oral Daily  . carvedilol  3.125 mg Oral BID WC  . cinacalcet  90 mg Oral Q breakfast  . feeding supplement  1 Container Oral BID BM  . mirtazapine  30 mg Oral QHS  . pantoprazole  40 mg Oral Daily  . polyethylene glycol  17 g Oral Daily  . potassium chloride  40 mEq Oral BID  . senna-docusate  1 tablet Oral BID  . Warfarin - Pharmacist Dosing Inpatient   Does not apply q1600   Continuous Infusions: . sodium chloride 100 mL/hr at 07/20/20 0423  . ampicillin (OMNIPEN) IV 1 g (07/20/20 1054)  . magnesium sulfate bolus IVPB    . potassium PHOSPHATE IVPB (in mmol) 30 mmol (07/20/20 1407)     LOS: 4 days       Kathlen Mody, MD [facility]   To contact the attending provider between 7A-7P or the covering provider during after hours 7P-7A, please log into the web site [email or web address] and access using universal  password for that web site. If you do not have the password, please call the hospital operator.  07/20/2020, 2:56 PM

## 2020-07-21 LAB — BASIC METABOLIC PANEL
Anion gap: 11 (ref 5–15)
BUN: 7 mg/dL — ABNORMAL LOW (ref 8–23)
CO2: 18 mmol/L — ABNORMAL LOW (ref 22–32)
Calcium: 7.3 mg/dL — ABNORMAL LOW (ref 8.9–10.3)
Chloride: 106 mmol/L (ref 98–111)
Creatinine, Ser: 0.66 mg/dL (ref 0.44–1.00)
GFR, Estimated: >60 mL/min (ref >60)
Glucose, Bld: 85 mg/dL (ref 70–99)
Potassium: 3.4 mmol/L — ABNORMAL LOW (ref 3.5–5.1)
Sodium: 135 mmol/L (ref 135–145)

## 2020-07-21 LAB — PROTIME-INR
INR: 3.6 — ABNORMAL HIGH (ref 0.8–1.2)
Prothrombin Time: 34.7 seconds — ABNORMAL HIGH (ref 11.4–15.2)

## 2020-07-21 LAB — MAGNESIUM: Magnesium: 1.6 mg/dL — ABNORMAL LOW (ref 1.7–2.4)

## 2020-07-21 LAB — PHOSPHORUS: Phosphorus: 2.1 mg/dL — ABNORMAL LOW (ref 2.5–4.6)

## 2020-07-21 MED ORDER — MAGNESIUM SULFATE IN D5W 1-5 GM/100ML-% IV SOLN
1.0000 g | Freq: Once | INTRAVENOUS | Status: AC
  Administered 2020-07-21: 1 g via INTRAVENOUS
  Filled 2020-07-21: qty 100

## 2020-07-21 MED ORDER — MAGNESIUM SULFATE 50 % IJ SOLN: 3.0000 g | Freq: Once | INTRAVENOUS | Status: DC

## 2020-07-21 MED ORDER — POTASSIUM & SODIUM PHOSPHATES 280-160-250 MG PO PACK
1.0000 | PACK | Freq: Three times a day (TID) | ORAL | Status: DC
  Administered 2020-07-21 – 2020-07-23 (×5): 1 via ORAL
  Filled 2020-07-21 (×6): qty 1

## 2020-07-21 MED ORDER — MAGNESIUM SULFATE 2 GM/50ML IV SOLN
2.0000 g | Freq: Once | INTRAVENOUS | Status: AC
  Administered 2020-07-21: 2 g via INTRAVENOUS
  Filled 2020-07-21: qty 50

## 2020-07-21 NOTE — Progress Notes (Signed)
PROGRESS NOTE    Annette Rogers  IHK:[id number] DOB: August 20, 1956 DOA: 07/15/2020 PCP: Patient, No Pcp Per   Chief Complaint  Patient presents with  . Weakness    Brief Narrative:  Prior history of COPD, GERD, hyperlipidemia, hypercalcemia, hypertension, history of CVA with left-sided hemiplegia, hyperparathyroidism, mitral valve replacement, history of tricuspid valve repair presents to the hospital for generalized weakness associated with some nausea, vomiting.  She was found to have significant hypercalcemia in relation to known primary hyperparathyroidism, in addition to hypercalcemia, she had profound hypokalemia, hypomagnesemia and hypophosphatemia. She is currently undergoing IV replacements as she is nauseated with oral supplementation.  Pt seen and examined today, she hasn't had any oral intake today.  Pt continues to have poor oral intake.   Assessment & Plan:   Principal Problem:   Hypercalcemia Active Problems:   Hypertension   COPD (chronic obstructive pulmonary disease) (HCC)   GERD (gastroesophageal reflux disease)   Hyperparathyroidism (HCC)   Left hemiplegia (HCC)   Non-rheumatic mitral valve stenosis   Paroxysmal A-fib (HCC)   S/P mitral valve replacement with metallic valve   S/P TVR (tricuspid valve repair)   Hypokalemia   Hyponatremia   Normocytic anemia   Hypomagnesemia   Hypophosphatemia   Hypercalcemia would explain her generalized weakness, nausea and vomiting. Patient received a dose of Zometa and IV Lasix. Calcium has improved from 13 to 9. Discontinue lasix.  Continue with IV fluids as she has poor oral intake by mouth.     Primary hyperparathyroidism Associated with hypercalcemia and hypophosphatemia.  No indications for surgery at this time. Continue with cinacalcet. PTH wnl.    Hypophosphatemia Replaced. Repeat phos level is 2.1, sodium phos is ordered.  Watch for refeeding syndrome.     Paroxysmal atrial fibrillation Rate  controlled.  Continue with amiodarone and Coreg for rate control and  On Coumadin for anti coagulation.  INR is 3.6 Repeat INR tomorrow. No bleeding evident.     History of stroke with dysphagia and dysarthria and left hemiplegia Patient is on dysphagia 2 diet.  Dietary consult in place. PEJ in place, but it is leaking and gen surgery / IR consulted for replacement.  Plan for  PEJ replacement by IR scheduled on Monday at [facility].    Primary hypertension BP parameters are optimal.   GERD Continue with PPI    COPD No wheezing heard today.    Acute anemia: Baseline hemoglobin around 11, dropped to 8.6, ? Hemodilution from fluids. Repeat CBC in am.  Will get anemia panel and check stool for occult blood.     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      Patient reports left arm pain and back pain DVT is ruled out.  Pain control.     Back pain  X rays are unremarkable.  PT eval recommending SNF, but pt refused, wants to go home.  Son at bedside, reports finding a home for her.   Nausea, vomiting.  Resolved.   abd x rays ruled out obstruction.    Enterococcus UTI:  Sensitive to ampicillin. Started her on ampicillin  Complete the course of 5 days of antibiotics.  Last day of antibiotics today.   In view of her multiple medical problems, poor oral intake, dysphagia, clinical deterioration, palliative care consulted. She will be seen by palliative in am.   DVT prophylaxis:coumadin.  Code Status: (Full code Family  Communication: discussed with daughter over the phone.  Disposition:   Status is: Inpatient  Remains inpatient appropriate because:Ongoing diagnostic testing needed not appropriate for outpatient work up, Unsafe d/c plan and Inpatient level of care appropriate due to severity of illness   Dispo: The patient is from: Home               Anticipated d/c is to: Home              Anticipated d/c date is: 3 days              Patient currently is not medically stable to d/c.       Consultants:   IR  PALLIATIVE CARE.    Procedures: none   Antimicrobials: None    Subjective: As per RN, pt did not eat anything all day.  Objective: Vitals:   07/20/20 1516 07/20/20 2024 07/21/20 0618 07/21/20 1430  BP: 109/61 (!) 146/76 (!) 102/59 106/66  Pulse: 91 92 98 88  Resp: 17 18 18 20   Temp: 98.1 F (36.7 C) 98.8 F (37.1 C) 99.3 F (37.4 C) 98.6 F (37 C)  TempSrc:      SpO2: 100% 100% 100% 100%  Weight:      Height:        Intake/Output Summary (Last 24 hours) at 07/21/2020 1548 Last data filed at 07/21/2020 0600 Gross per 24 hour  Intake 159.46 ml  Output 400 ml  Net -240.54 ml   Filed Weights   07/15/20 1922 07/16/20 0347  Weight: 71.7 kg 71.1 kg    Examination:  General exam: in bed, ill appearing. Not in distress.  Respiratory system: clear to auscultation, no wheezing or rhonchi.  Cardiovascular system: s1s2, RRR, no JVD,  Gastrointestinal system: Abdomen is soft, non tender non distended, bowel sounds wnl.  Central nervous system: Alert and oriented, non focal.  Extremities: left hemiplegia, mild swelling of the left foot.  Skin:  No rashes.  Psychiatry: Flat affect    Data Reviewed: I have personally reviewed following labs and imaging studies  CBC: Recent Labs  Lab 07/15/20 2107 07/16/20 0611 07/18/20 2151  WBC 10.7* 7.4 7.2  NEUTROABS 7.6  --   --   HGB 11.3* 11.0* 8.6*  HCT 37.5 37.5 27.7*  MCV 87.0 87.8 86.3  PLT 343 263 242    Basic Metabolic Panel: Recent Labs  Lab 07/16/20 1322 07/17/20 1945 07/18/20 0030 07/18/20 0030 07/18/20 0756 07/18/20 2151 07/19/20 0706 07/20/20 0647 07/21/20 0543 07/21/20 0544  NA   < >  --  138   < > 140  139 135 138 136 135  --   K   < >  --  2.4*   < > 2.5*  2.4* 3.0* 2.9* 2.9* 3.4*  --   CL   < >  --  107   < > 108   108 104 105 103 106  --   CO2   < >  --  24   < > 20*  21* 23 22 21* 18*  --   GLUCOSE   < >  --  78   < > 72  75 113* 85 86 85  --   BUN   < >  --  14   < > 14  14 14 14 9  7*  --   CREATININE   < >  --  0.70   < > 0.67  0.74 0.77 0.64 0.60 0.66  --  CALCIUM   < >  --  9.3   < > 9.3  9.1 8.4* 8.3* 7.8* 7.3*  --   MG  --  1.0* 1.0*  --  2.2  --   --  1.2* 1.6*  --   PHOS  --   --  2.0*   < > 1.8*  1.7* 4.2 2.2* 2.1*  --  2.1*   < > = values in this interval not displayed.    GFR: Estimated Creatinine Clearance: 60.8 mL/min (by C-G formula based on SCr of 0.66 mg/dL).  Liver Function Tests: Recent Labs  Lab 07/15/20 2107 07/15/20 2107 07/16/20 0611 07/18/20 0030 07/18/20 0756 07/19/20 0706 07/20/20 0647  AST 42*  --  35  --   --   --  38  ALT 17  --  15  --   --   --  18  ALKPHOS 92  --  87  --   --   --  86  BILITOT 1.5*  --  1.5*  --   --   --  0.9  PROT 8.0  --  7.2  --   --   --  6.4*  ALBUMIN 3.5   < > 3.2* 2.6* 2.5* 2.6* 2.8*   < > = values in this interval not displayed.    CBG: No results for input(s): GLUCAP in the last 168 hours.   Recent Results (from the past 240 hour(s))  Respiratory Panel by RT PCR (Flu A&B, Covid) - Urine, Clean Catch     Status: None   Collection Time: 07/15/20 11:18 PM   Specimen: Urine, Clean Catch; Nasopharyngeal  Result Value Ref Range Status   SARS Coronavirus 2 by RT PCR NEGATIVE NEGATIVE Final    Comment: (NOTE) SARS-CoV-2 target nucleic acids are NOT DETECTED.  The SARS-CoV-2 RNA is generally detectable in upper respiratoy specimens during the acute phase of infection. The lowest concentration of SARS-CoV-2 viral copies this assay can detect is 131 copies/mL. A negative result does not preclude SARS-Cov-2 infection and should not be used as the sole basis for treatment or other patient management decisions. A negative result may occur with  improper specimen collection/handling, submission of specimen other than  nasopharyngeal swab, presence of viral mutation(s) within the areas targeted by this assay, and inadequate number of viral copies (<131 copies/mL). A negative result must be combined with clinical observations, patient history, and epidemiological information. The expected result is Negative.  Fact Sheet for Patients:  https://www.moore.com/  Fact Sheet for Healthcare Providers:  https://www.young.biz/  This test is no t yet approved or cleared by the [place] FDA and  has been authorized for detection and/or diagnosis of SARS-CoV-2 by FDA under an Emergency Use Authorization (EUA). This EUA will remain  in effect (meaning this test can be used) for the duration of the COVID-19 declaration under Section 564(b)(1) of the Act, 21 U.S.C. section 360bbb-3(b)(1), unless the authorization is terminated or revoked sooner.     Influenza A by PCR NEGATIVE NEGATIVE Final   Influenza B by PCR NEGATIVE NEGATIVE Final    Comment: (NOTE) The Xpert Xpress SARS-CoV-2/FLU/RSV assay is intended as an aid in  the diagnosis of influenza from Nasopharyngeal swab specimens and  should not be used as a sole basis for treatment. Nasal washings and  aspirates are unacceptable for Xpert Xpress SARS-CoV-2/FLU/RSV  testing.  Fact Sheet for Patients: https://www.moore.com/  Fact Sheet for Healthcare Providers: https://www.young.biz/  This test is not yet approved  or cleared by the [place] FDA and  has been authorized for detection and/or diagnosis of SARS-CoV-2 by  FDA under an Emergency Use Authorization (EUA). This EUA will remain  in effect (meaning this test can be used) for the duration of the  Covid-19 declaration under Section 564(b)(1) of the Act, 21  U.S.C. section 360bbb-3(b)(1), unless the authorization is  terminated or revoked. Performed at [facility], [address] [place], [place] [address]     Urine culture     Status: Abnormal   Collection Time: 07/15/20 11:22 PM   Specimen: Urine, Clean Catch  Result Value Ref Range Status   Specimen Description   Final    URINE, CLEAN CATCH Performed at [facility], [address] [place], [place] [address]    Special Requests   Final    NONE Performed at [facility], [address] [place], [place] [address]    Culture >=100,000 COLONIES/mL ENTEROCOCCUS FAECALIS (A)  Final   Report Status 07/18/2020 FINAL  Final   Organism ID, Bacteria ENTEROCOCCUS FAECALIS (A)  Final      Susceptibility   Enterococcus faecalis - MIC*    AMPICILLIN <=2 SENSITIVE Sensitive     NITROFURANTOIN <=16 SENSITIVE Sensitive     VANCOMYCIN 1 SENSITIVE Sensitive     * >=100,000 COLONIES/mL ENTEROCOCCUS FAECALIS         Radiology Studies: No results found.      Scheduled Meds: . amiodarone  200 mg Oral Daily  . aspirin EC  81 mg Oral Daily  . carvedilol  3.125 mg Oral BID WC  . cinacalcet  90 mg Oral Q breakfast  . feeding supplement  1 Container Oral BID BM  . mirtazapine  30 mg Oral QHS  . pantoprazole  40 mg Oral Daily  . polyethylene glycol  17 g Oral Daily  . potassium & sodium phosphates  1 packet Oral TID WC & HS  . potassium chloride  40 mEq Oral BID  . senna-docusate  1 tablet Oral BID  . Warfarin - Pharmacist Dosing Inpatient   Does not apply q1600   Continuous Infusions: . sodium chloride 100 mL/hr at 07/20/20 0423  . ampicillin (OMNIPEN) IV 1 g (07/21/20 1249)     LOS: 5 days       Kathlen ModyVijaya Desiraye Rolfson, MD [facility]   To contact the attending provider between 7A-7P or the covering provider during after hours 7P-7A, please log into the web site [email or web address] and access using universal T[place] password for that web site. If you do not have the password, please call the hospital operator.  07/21/2020, 3:48 PM

## 2020-07-21 NOTE — Progress Notes (Signed)
ANTICOAGULATION CONSULT NOTE -   Pharmacy Consult for Coumadin Indication: MVR and Afib  No Known Allergies  Patient Measurements: Height: 5' (152.4 cm) Weight: 71.1 kg (156 lb 12 oz) IBW/kg (Calculated) : 45.5  Vital Signs: Temp: 98.6 F (37 C) (11/07 1430) BP: 106/66 (11/07 1430) Pulse Rate: 88 (11/07 1430)  Labs: Recent Labs    07/18/20 2151 07/18/20 2151 07/19/20 0706 07/20/20 0647 07/21/20 0543 07/21/20 0544  HGB 8.6*  --   --   --   --   --   HCT 27.7*  --   --   --   --   --   PLT 242  --   --   --   --   --   LABPROT 46.1*  --   --  34.5*  --  34.7*  INR 5.2*  --   --  3.6*  --  3.6*  CREATININE 0.77   < > 0.64 0.60 0.66  --    < > = values in this interval not displayed.    Estimated Creatinine Clearance: 60.8 mL/min (by C-G formula based on SCr of 0.66 mg/dL).   Assessment: 64yo female c/o weakness and nausea x1wk, admitted for hypercalcemia, to continue Coumadin for MVR/Afib.   Home dose: warfarin 2mg  daily Last dose:  07/14/20 Drug Interactions (major): amiodarone 200mg  daily  07/21/20 1000 UPDATE INR:3.6->despite holding warfarin Pt is refusing PO/NG intake, so this will affect INR CBC:  Hb 8.6    Plates: RN reports no s/s of bleeding at this time  Goal of Therapy:  INR 2.5-3.5   Plan:  Continue to hold warfarin for INR 3.6 F/U re-start of warfarin when INR back in range-->may need to lower dose d/t amiodarone interaction and reduced PO intake Daily INR and every other day CBC Monitor for S/S of bleeding  13/7/21, Pharm. D. Clinical Pharmacist 07/21/2020 4:01 PM

## 2020-07-21 NOTE — Progress Notes (Signed)
[facility] called and transport arranged for patient procedure on 07-22-2020.  Pick up time is 0800 according to scheduler.

## 2020-07-22 ENCOUNTER — Ambulatory Visit (HOSPITAL_COMMUNITY): Payer: Medicare Other | Attending: Internal Medicine

## 2020-07-22 DIAGNOSIS — K9423 Gastrostomy malfunction: Secondary | ICD-10-CM | POA: Insufficient documentation

## 2020-07-22 HISTORY — PX: IR GJ TUBE CHANGE: IMG1440

## 2020-07-22 LAB — IRON AND TIBC
Iron: 19 ug/dL — ABNORMAL LOW (ref 28–170)
Saturation Ratios: 9 % — ABNORMAL LOW (ref 10.4–31.8)
TIBC: 216 ug/dL — ABNORMAL LOW (ref 250–450)
UIBC: 197 ug/dL

## 2020-07-22 LAB — MAGNESIUM: Magnesium: 2.2 mg/dL (ref 1.7–2.4)

## 2020-07-22 LAB — BASIC METABOLIC PANEL
Anion gap: 9 (ref 5–15)
BUN: 6 mg/dL — ABNORMAL LOW (ref 8–23)
CO2: 17 mmol/L — ABNORMAL LOW (ref 22–32)
Calcium: 7.3 mg/dL — ABNORMAL LOW (ref 8.9–10.3)
Chloride: 108 mmol/L (ref 98–111)
Creatinine, Ser: 0.68 mg/dL (ref 0.44–1.00)
GFR, Estimated: >60 mL/min (ref >60)
Glucose, Bld: 71 mg/dL (ref 70–99)
Potassium: 3.7 mmol/L (ref 3.5–5.1)
Sodium: 134 mmol/L — ABNORMAL LOW (ref 135–145)

## 2020-07-22 LAB — VITAMIN B12: Vitamin B-12: 1825 pg/mL — ABNORMAL HIGH (ref 180–914)

## 2020-07-22 LAB — CBC
HCT: 30.4 % — ABNORMAL LOW (ref 36.0–46.0)
Hemoglobin: 9.1 g/dL — ABNORMAL LOW (ref 12.0–15.0)
MCH: 25.7 pg — ABNORMAL LOW (ref 26.0–34.0)
MCHC: 29.9 g/dL — ABNORMAL LOW (ref 30.0–36.0)
MCV: 85.9 fL (ref 80.0–100.0)
Platelets: 316 10*3/uL (ref 150–400)
RBC: 3.54 MIL/uL — ABNORMAL LOW (ref 3.87–5.11)
RDW: 16.1 % — ABNORMAL HIGH (ref 11.5–15.5)
WBC: 7.6 10*3/uL (ref 4.0–10.5)
nRBC: 0 % (ref 0.0–0.2)

## 2020-07-22 LAB — RETICULOCYTES
Immature Retic Fract: 15 % (ref 2.3–15.9)
RBC.: 3.48 MIL/uL — ABNORMAL LOW (ref 3.87–5.11)
Retic Count, Absolute: 28.2 10*3/uL (ref 19.0–186.0)
Retic Ct Pct: 0.8 % (ref 0.4–3.1)

## 2020-07-22 LAB — PROTIME-INR
INR: 3.3 — ABNORMAL HIGH (ref 0.8–1.2)
Prothrombin Time: 32.5 seconds — ABNORMAL HIGH (ref 11.4–15.2)

## 2020-07-22 LAB — FERRITIN: Ferritin: 564 ng/mL — ABNORMAL HIGH (ref 11–307)

## 2020-07-22 LAB — FOLATE: Folate: 19.5 ng/mL (ref >5.9)

## 2020-07-22 LAB — PHOSPHORUS: Phosphorus: 1.7 mg/dL — ABNORMAL LOW (ref 2.5–4.6)

## 2020-07-22 MED ORDER — LIDOCAINE HCL URETHRAL/MUCOSAL 2 % EX GEL
CUTANEOUS | Status: DC | PRN
  Administered 2020-07-22: 1

## 2020-07-22 MED ORDER — IOHEXOL 300 MG/ML  SOLN
INTRAMUSCULAR | Status: DC | PRN
  Administered 2020-07-22: 50 mL

## 2020-07-22 MED ORDER — WARFARIN SODIUM 1 MG PO TABS: 1.0000 mg | ORAL_TABLET | Freq: Once | ORAL | Status: DC

## 2020-07-22 MED ORDER — SODIUM CHLORIDE 0.9 % IV SOLN
1.0000 g | Freq: Four times a day (QID) | INTRAVENOUS | Status: DC
  Filled 2020-07-22 (×4): qty 1000

## 2020-07-22 NOTE — Progress Notes (Addendum)
Notified daughter [name] of patient procedure on 07-21-20 in the pm and again on 07-22-20 at 0630.  Daughter stated she would not be able to be with patient at the hospital due to transportation issues.  Daughter phone number is present and accurate in chart.  Patient stable this am.

## 2020-07-22 NOTE — Progress Notes (Signed)
Per MD unable to use PEG tube at this time. New replacement 07/22/2020 only flushes today and meal replacements tomorrow per dietitian orders.

## 2020-07-22 NOTE — Progress Notes (Signed)
[facility] ([facility])  Hospital Liaison RN note         Notified by TOC manager of patient/family request for [facility] Palliative services at home after discharge.       [facility] Palliative team will follow up with patient after discharge.         Please call with any hospice or palliative related questions.         Thank you for the opportunity to participate in this patient's care.     Chrislyn King, BSN, RN [facility] Hospital Liaison (listed on AMION under Hospice/[facility])    [phone]     

## 2020-07-22 NOTE — Progress Notes (Signed)
SLP Cancellation Note  Patient Details Name: Annette Rogers MRN: [id number] DOB: 1956-06-19   Cancelled treatment:       Reason Eval/Treat Not Completed: Patient at procedure or test/unavailable; Pt at [facility] for PEG tube replacement.  Thank you,  Havery Moros, CCC-SLP [phone]    Makynlee Kressin 07/22/2020, 1:06 PM

## 2020-07-22 NOTE — Progress Notes (Signed)
Unable to give patient her 12.00 dose of Omnipen. Patient at [facility] for procedure. Pharmacist has been notified.

## 2020-07-22 NOTE — Progress Notes (Signed)
PROGRESS NOTE    Annette Rogers  JJK:[id number] DOB: [birth year] DOA: 07/15/2020 PCP: Patient, No Pcp Per   Chief Complaint  Patient presents with  . Weakness    Brief Narrative:  Prior history of COPD, GERD, hyperlipidemia, hypercalcemia, hypertension, history of CVA with left-sided hemiplegia, hyperparathyroidism, mitral valve replacement, history of tricuspid valve repair presents to the hospital for generalized weakness associated with some nausea, vomiting.  She was found to have significant hypercalcemia in relation to known primary hyperparathyroidism, in addition to hypercalcemia, she had profound hypokalemia, hypomagnesemia and hypophosphatemia. She is currently undergoing IV replacements as she is nauseated with oral supplementation.  Patient seen and examined at bedside transferred to [facility] for PEJ replacement Assessment & Plan:   Principal Problem:   Hypercalcemia Active Problems:   Hypertension   COPD (chronic obstructive pulmonary disease) (HCC)   GERD (gastroesophageal reflux disease)   Hyperparathyroidism (HCC)   Left hemiplegia (HCC)   Non-rheumatic mitral valve stenosis   Paroxysmal A-fib (HCC)   S/P mitral valve replacement with metallic valve   S/P TVR (tricuspid valve repair)   Hypokalemia   Hyponatremia   Normocytic anemia   Hypomagnesemia   Hypophosphatemia   Hypercalcemia would explain her generalized weakness, nausea and vomiting. Patient received a dose of Zometa and IV Lasix. Calcium has improved from 13 to 9. Discontinue lasix.  Continue with IV fluids as she has poor oral intake by mouth.     Primary hyperparathyroidism Associated with hypercalcemia and hypophosphatemia.  No indications for surgery at this time. Continue with cinacalcet. PTH wnl.    Hypophosphatemia Replaced. Repeat phos level is 1.7 sodium phos is ordered.  Watch for refeeding syndrome.     Paroxysmal atrial fibrillation Rate controlled.  Continue with amiodarone and  Coreg for rate control and  On Coumadin for anti coagulation.  INR is 3.6 Repeat INR tomorrow. No bleeding evident.     History of stroke with dysphagia and dysarthria and left hemiplegia Patient is on dysphagia 2 diet.  Dietary consult in place. PEJ in place, but it is leaking and gen surgery / IR consulted for replacement.  Plan for  PEJ replacement by IR scheduled on Monday at [facility].    Primary hypertension bp parameters are optimal.    GERD Continue with PPI    COPD No wheezing heard today.    Acute anemia: Baseline hemoglobin around 11, dropped to 8.6, ? Hemodilution from fluids. Repeat CBC in am.  Will get anemia panel and check stool for occult blood.     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      Patient reports left arm pain and back pain DVT is ruled out.  Pain control.     Back pain  X rays are unremarkable.  PT eval recommending SNF, but pt refused, wants to go home.  Son at bedside, reports finding a home for her.   Nausea, vomiting.  Resolved.   abd x rays ruled out obstruction.    Enterococcus UTI:  Sensitive to ampicillin. Completed the course of antibiotics today.    In view of her multiple medical problems, poor oral intake, dysphagia, clinical deterioration, palliative care consulted. She will be seen by palliative in am.   DVT prophylaxis:coumadin.  Code Status: (Full code Family Communication: discussed with daughter over the phone on 11/07  Disposition:   Status is: Inpatient  Remains inpatient appropriate because:Ongoing  diagnostic testing needed not appropriate for outpatient work up, Unsafe d/c plan and Inpatient level of care appropriate due to severity of illness   Dispo: The patient is from: Home              Anticipated d/c is to: Home              Anticipated d/c date is: 1 day               Patient currently is not medically stable to d/c.       Consultants:   IR  PALLIATIVE CARE.    Procedures: none   Antimicrobials: None    Subjective: No new complaints this morning Objective: Vitals:   07/21/20 0618 07/21/20 1430 07/21/20 2048 07/22/20 0528  BP: (!) 102/59 106/66 (!) 155/71 124/66  Pulse: 98 88 85 95  Resp: 18 20 16 19   Temp: 99.3 F (37.4 C) 98.6 F (37 C) 97.9 F (36.6 C) 98.6 F (37 C)  TempSrc:   Oral Oral  SpO2: 100% 100% 100% 100%  Weight:      Height:        Intake/Output Summary (Last 24 hours) at 07/22/2020 1320 Last data filed at 07/22/2020 1147 Gross per 24 hour  Intake 454.2 ml  Output --  Net 454.2 ml   Filed Weights   07/15/20 1922 07/16/20 0347  Weight: 71.7 kg 71.1 kg    Examination:  General exam: Alert and comfortable not in any kind of distress Respiratory system: Clear to auscultation bilaterally, no wheezing or rhonchi Cardiovascular system: S1-S2 heard, regular rate rhythm, no JVD Gastrointestinal system: Abdomen is soft, nontender, bowel sounds normal Central nervous system: Alert and oriented to person and place, grossly nonfocal Extremities: Left hemiparesis, swelling of the left foot and left upper extremity Skin: No rashes seen Psychiatry: Mood is appropriate    Data Reviewed: I have personally reviewed following labs and imaging studies  CBC: Recent Labs  Lab 07/15/20 2107 07/16/20 0611 07/18/20 2151 07/22/20 0650  WBC 10.7* 7.4 7.2 7.6  NEUTROABS 7.6  --   --   --   HGB 11.3* 11.0* 8.6* 9.1*  HCT 37.5 37.5 27.7* 30.4*  MCV 87.0 87.8 86.3 85.9  PLT 343 263 242 316    Basic Metabolic Panel: Recent Labs  Lab 07/18/20 0030 07/18/20 0030 07/18/20 0756 07/18/20 0756 07/18/20 2151 07/19/20 0706 07/20/20 0647 07/21/20 0543 07/21/20 0544 07/22/20 0650  NA 138   < > 140  139   < > 135 138 136 135  --  134*  K 2.4*   < > 2.5*  2.4*   < > 3.0* 2.9* 2.9* 3.4*  --  3.7  CL 107   < > 108   108   < > 104 105 103 106  --  108  CO2 24   < > 20*  21*   < > 23 22 21* 18*  --  17*  GLUCOSE 78   < > 72  75   < > 113* 85 86 85  --  71  BUN 14   < > 14  14   < > 14 14 9  7*  --  6*  CREATININE 0.70   < > 0.67  0.74   < > 0.77 0.64 0.60 0.66  --  0.68  CALCIUM 9.3   < > 9.3  9.1   < > 8.4* 8.3* 7.8* 7.3*  --  7.3*  MG 1.0*  --  2.2  --   --   --  1.2* 1.6*  --  2.2  PHOS 2.0*   < > 1.8*  1.7*   < > 4.2 2.2* 2.1*  --  2.1* 1.7*   < > = values in this interval not displayed.    GFR: Estimated Creatinine Clearance: 60.8 mL/min (by C-G formula based on SCr of 0.68 mg/dL).  Liver Function Tests: Recent Labs  Lab 07/15/20 2107 07/15/20 2107 07/16/20 0611 07/18/20 0030 07/18/20 0756 07/19/20 0706 07/20/20 0647  AST 42*  --  35  --   --   --  38  ALT 17  --  15  --   --   --  18  ALKPHOS 92  --  87  --   --   --  86  BILITOT 1.5*  --  1.5*  --   --   --  0.9  PROT 8.0  --  7.2  --   --   --  6.4*  ALBUMIN 3.5   < > 3.2* 2.6* 2.5* 2.6* 2.8*   < > = values in this interval not displayed.    CBG: No results for input(s): GLUCAP in the last 168 hours.   Recent Results (from the past 240 hour(s))  Respiratory Panel by RT PCR (Flu A&B, Covid) - Urine, Clean Catch     Status: None   Collection Time: 07/15/20 11:18 PM   Specimen: Urine, Clean Catch; Nasopharyngeal  Result Value Ref Range Status   SARS Coronavirus 2 by RT PCR NEGATIVE NEGATIVE Final    Comment: (NOTE) SARS-CoV-2 target nucleic acids are NOT DETECTED.  The SARS-CoV-2 RNA is generally detectable in upper respiratoy specimens during the acute phase of infection. The lowest concentration of SARS-CoV-2 viral copies this assay can detect is 131 copies/mL. A negative result does not preclude SARS-Cov-2 infection and should not be used as the sole basis for treatment or other patient management decisions. A negative result may occur with  improper specimen collection/handling, submission of specimen other than  nasopharyngeal swab, presence of viral mutation(s) within the areas targeted by this assay, and inadequate number of viral copies (<131 copies/mL). A negative result must be combined with clinical observations, patient history, and epidemiological information. The expected result is Negative.  Fact Sheet for Patients:  https://www.moore.com/https://www.fda.gov/media/142436/download  Fact Sheet for Healthcare Providers:  https://www.young.biz/https://www.fda.gov/media/142435/download  This test is no t yet approved or cleared by the [place] FDA and  has been authorized for detection and/or diagnosis of SARS-CoV-2 by FDA under an Emergency Use Authorization (EUA). This EUA will remain  in effect (meaning this test can be used) for the duration of the COVID-19 declaration under Section 564(b)(1) of the Act, 21 U.S.C. section 360bbb-3(b)(1), unless the authorization is terminated or revoked sooner.     Influenza A by PCR NEGATIVE NEGATIVE Final   Influenza B by PCR NEGATIVE NEGATIVE Final    Comment: (NOTE) The Xpert Xpress SARS-CoV-2/FLU/RSV assay is intended as an aid in  the diagnosis of influenza from Nasopharyngeal swab specimens and  should not be used as a sole basis for treatment. Nasal washings and  aspirates are unacceptable for Xpert Xpress SARS-CoV-2/FLU/RSV  testing.  Fact Sheet for Patients: https://www.moore.com/https://www.fda.gov/media/142436/download  Fact Sheet for Healthcare Providers: https://www.young.biz/https://www.fda.gov/media/142435/download  This test is not yet approved or cleared by the [place] FDA and  has been authorized for detection and/or diagnosis of SARS-CoV-2 by  FDA under an Emergency Use Authorization (EUA). This EUA will  remain  in effect (meaning this test can be used) for the duration of the  Covid-19 declaration under Section 564(b)(1) of the Act, 21  U.S.C. section 360bbb-3(b)(1), unless the authorization is  terminated or revoked. Performed at [facility], [address] [place], [place] [address]     Urine culture     Status: Abnormal   Collection Time: 07/15/20 11:22 PM   Specimen: Urine, Clean Catch  Result Value Ref Range Status   Specimen Description   Final    URINE, CLEAN CATCH Performed at [facility], [address], [place], [place] [address]    Special Requests   Final    NONE Performed at [facility], [address], [place], [place] [address]    Culture >=100,000 COLONIES/mL ENTEROCOCCUS FAECALIS (A)  Final   Report Status 07/18/2020 FINAL  Final   Organism ID, Bacteria ENTEROCOCCUS FAECALIS (A)  Final      Susceptibility   Enterococcus faecalis - MIC*    AMPICILLIN <=2 SENSITIVE Sensitive     NITROFURANTOIN <=16 SENSITIVE Sensitive     VANCOMYCIN 1 SENSITIVE Sensitive     * >=100,000 COLONIES/mL ENTEROCOCCUS FAECALIS         Radiology Studies: No results found.      Scheduled Meds: . amiodarone  200 mg Oral Daily  . aspirin EC  81 mg Oral Daily  . carvedilol  3.125 mg Oral BID WC  . cinacalcet  90 mg Oral Q breakfast  . feeding supplement  1 Container Oral BID BM  . mirtazapine  30 mg Oral QHS  . pantoprazole  40 mg Oral Daily  . polyethylene glycol  17 g Oral Daily  . potassium & sodium phosphates  1 packet Oral TID WC & HS  . potassium chloride  40 mEq Oral BID  . senna-docusate  1 tablet Oral BID  . warfarin  1 mg Oral ONCE-1600  . Warfarin - Pharmacist Dosing Inpatient   Does not apply q1600   Continuous Infusions:    LOS: 6 days       Kathlen Mody, MD [facility]   To contact the attending provider between 7A-7P or the covering provider during after hours 7P-7A, please log into the web site [email or web address] and access using universal [place] password for that web site. If you do not have the password, please call the hospital operator.  07/22/2020, 1:20 PM

## 2020-07-22 NOTE — Plan of Care (Signed)
Palliative: Thank you for this consult. Patient was not in room, and has been transported to [facility] main campus for PEG tube replacement.  There will be a delay in a Palliative Provider seeing this patient.  Lillia Carmel, NP Palliative Medicine Please call Palliative Medicine team phone with any questions [phone]. For individual providers please see AMION. No charge.

## 2020-07-22 NOTE — Progress Notes (Signed)
ANTICOAGULATION CONSULT NOTE -   Pharmacy Consult for Coumadin Indication: MVR and Afib  No Known Allergies  Patient Measurements: Height: 5' (152.4 cm) Weight: 71.1 kg (156 lb 12 oz) IBW/kg (Calculated) : 45.5  Vital Signs: Temp: 98.6 F (37 C) (11/08 0528) Temp Source: Oral (11/08 0528) BP: 124/66 (11/08 0528) Pulse Rate: 95 (11/08 0528)  Labs: Recent Labs    07/20/20 0647 07/21/20 0543 07/21/20 0544 07/22/20 0650  HGB  --   --   --  9.1*  HCT  --   --   --  30.4*  PLT  --   --   --  316  LABPROT 34.5*  --  34.7* 32.5*  INR 3.6*  --  3.6* 3.3*  CREATININE 0.60 0.66  --  0.68    Estimated Creatinine Clearance: 60.8 mL/min (by C-G formula based on SCr of 0.68 mg/dL).   Assessment: 64yo female c/o weakness and nausea x1wk, admitted for hypercalcemia, to continue Coumadin for MVR/Afib.   Home dose: warfarin 2mg  daily Last dose:  07/14/20 Drug Interactions (major): amiodarone 200mg  daily  07/22/20   UPDATE INR: now 3.3->in desired goal range  CBC:  Hb 9.1    Plates: RN reports no s/s of bleeding at this time  Goal of Therapy:  INR 2.5-3.5   Plan:  Give  warfarin  1mg  x1 dose tonight for INR 3.3 (50% < home dose)  Daily INR and every other day CBC Monitor for S/S of bleeding  13/8/21, Pharm. D. Clinical Pharmacist 07/22/2020 12:11 PM

## 2020-07-22 NOTE — Care Management Important Message (Signed)
Important Message  Patient Details  Name: Annette Rogers MRN: [id number] Date of Birth: Mar 13, 1956   Medicare Important Message Given:  Yes     Corey Harold 07/22/2020, 12:23 PM

## 2020-07-23 ENCOUNTER — Inpatient Hospital Stay (HOSPITAL_COMMUNITY): Payer: Medicare Other

## 2020-07-23 ENCOUNTER — Encounter (HOSPITAL_COMMUNITY): Payer: Self-pay | Admitting: Family Medicine

## 2020-07-23 DIAGNOSIS — Z515 Encounter for palliative care: Secondary | ICD-10-CM

## 2020-07-23 DIAGNOSIS — Z7189 Other specified counseling: Secondary | ICD-10-CM

## 2020-07-23 DIAGNOSIS — K9423 Gastrostomy malfunction: Secondary | ICD-10-CM

## 2020-07-23 LAB — BASIC METABOLIC PANEL
Anion gap: 13 (ref 5–15)
BUN: 10 mg/dL (ref 8–23)
CO2: 14 mmol/L — ABNORMAL LOW (ref 22–32)
Calcium: 7.7 mg/dL — ABNORMAL LOW (ref 8.9–10.3)
Chloride: 109 mmol/L (ref 98–111)
Creatinine, Ser: 0.86 mg/dL (ref 0.44–1.00)
GFR, Estimated: >60 mL/min (ref >60)
Glucose, Bld: 63 mg/dL — ABNORMAL LOW (ref 70–99)
Potassium: 4.1 mmol/L (ref 3.5–5.1)
Sodium: 136 mmol/L (ref 135–145)

## 2020-07-23 LAB — PROTIME-INR
INR: 2.8 — ABNORMAL HIGH (ref 0.8–1.2)
Prothrombin Time: 28.6 seconds — ABNORMAL HIGH (ref 11.4–15.2)

## 2020-07-23 LAB — HEPATIC FUNCTION PANEL
ALT: 24 U/L (ref 0–44)
AST: 51 U/L — ABNORMAL HIGH (ref 15–41)
Albumin: 2.7 g/dL — ABNORMAL LOW (ref 3.5–5.0)
Alkaline Phosphatase: 93 U/L (ref 38–126)
Bilirubin, Direct: 0.1 mg/dL (ref 0.0–0.2)
Indirect Bilirubin: 1 mg/dL — ABNORMAL HIGH (ref 0.3–0.9)
Total Bilirubin: 1.1 mg/dL (ref 0.3–1.2)
Total Protein: 6.6 g/dL (ref 6.5–8.1)

## 2020-07-23 LAB — PHOSPHORUS: Phosphorus: 1.7 mg/dL — ABNORMAL LOW (ref 2.5–4.6)

## 2020-07-23 LAB — GLUCOSE, CAPILLARY
Glucose-Capillary: 56 mg/dL — ABNORMAL LOW (ref 70–99)
Glucose-Capillary: 154 mg/dL — ABNORMAL HIGH (ref 70–99)

## 2020-07-23 MED ORDER — WARFARIN SODIUM 2 MG PO TABS: 2.0000 mg | ORAL_TABLET | Freq: Once | ORAL | Status: DC

## 2020-07-23 MED ORDER — ADULT MULTIVITAMIN W/MINERALS CH
1.0000 | ORAL_TABLET | Freq: Every day | ORAL | Status: DC
  Administered 2020-07-24: 1
  Filled 2020-07-23: qty 1

## 2020-07-23 MED ORDER — DEXTROSE 50 % IV SOLN: 12.5000 g | INTRAVENOUS | Status: DC

## 2020-07-23 MED ORDER — PROSOURCE TF PO LIQD
45.0000 mL | Freq: Every day | ORAL | Status: DC
  Administered 2020-07-24: 45 mL
  Filled 2020-07-23: qty 45

## 2020-07-23 MED ORDER — OSMOLITE 1.2 CAL PO LIQD: 1000.0000 mL | ORAL | Status: DC

## 2020-07-23 MED ORDER — OSMOLITE 1.5 CAL PO LIQD
1000.0000 mL | ORAL | Status: DC
  Administered 2020-07-24: 1000 mL

## 2020-07-23 MED ORDER — DEXTROSE 50 % IV SOLN
INTRAVENOUS | Status: AC
  Administered 2020-07-23: 50 mL
  Filled 2020-07-23: qty 50

## 2020-07-23 MED ORDER — IOHEXOL 300 MG/ML  SOLN
100.0000 mL | Freq: Once | INTRAMUSCULAR | Status: AC | PRN
  Administered 2020-07-23: 100 mL via INTRAVENOUS

## 2020-07-23 MED ORDER — SODIUM BICARBONATE 650 MG PO TABS
650.0000 mg | ORAL_TABLET | Freq: Two times a day (BID) | ORAL | Status: DC
  Administered 2020-07-23 – 2020-07-24 (×2): 650 mg via ORAL
  Filled 2020-07-23 (×2): qty 1

## 2020-07-23 NOTE — Consult Note (Signed)
Consultation Note Date: 07/23/2020   Patient Name: Annette Rogers  DOB: [birth year]  MRN: [id number]  Age / Sex: 64 y.o., female  PCP: Patient, No Pcp Per Referring Physician: Kathlen Mody, MD  Reason for Consultation: Establishing goals of care  HPI/Patient Profile: 64 y.o. female  with past medical history of COPD, GERD, HTN/HLD, hypercalcemia, hx CVA - L hemiplegia, hyperparathyroid, MV replacement, tricuspid repair admitted on 07/15/2020 with hypercalcemia.   Clinical Assessment and Goals of Care:  Conference with bedside nursing staff, it seems that Annette Rogers has been vomiting after administration of her medicines via PEG tube.  Annette Rogers is resting quietly in bed.  She appears acutely/chronically ill and quite frail.  She is alert and oriented x2, able to make her basic needs known.  There is no family at bedside at this time.  Annette Rogers and I talked about her PEG tube.  She tells me that the doctor said she will only need the tube for a short time.  I asked why she is not eating.  She tells me that she was hospitalized, and did not like the food.  Annette Rogers has a history of stroke, and does seem to have poor health literacy.  Annette Rogers tells me that she has been vomiting, is a little nauseous.  CT abdomen pending.  She is clearly not able to participate in a palliative discussion at this time.  Conference with attending, bedside nursing staff, transition of care team.  PMT to continue to follow.  HCPOA    NEXT OF KIN -sister, Annette Rogers    SUMMARY OF RECOMMENDATIONS   At this point continue full scope/full code Continue CODE STATUS discussions Anticipate return home  Code Status/Advance Care Planning:  Full code  Symptom Management:   Per hospitalist, no additional needs at this time.  Palliative Prophylaxis:   Frequent Pain Assessment and Turn  Reposition  Additional Recommendations (Limitations, Scope, Preferences):  Full Scope Treatment  Psycho-social/Spiritual:   Desire for further Chaplaincy support:no  Additional Recommendations: Caregiving  Support/Resources and Education on Hospice  Prognosis:   Unable to determine, based on outcomes.  6 months or less would not be surprising based on frailty, poor functional status.  Discharge Planning: To be determined, anticipate home      Primary Diagnoses: Present on Admission: . Hypercalcemia . Hypertension . GERD (gastroesophageal reflux disease) . Hyperparathyroidism (HCC) . COPD (chronic obstructive pulmonary disease) (HCC) . Left hemiplegia (HCC) . Non-rheumatic mitral valve stenosis . Paroxysmal A-fib (HCC) . Hypokalemia . Hyponatremia . Normocytic anemia . Hypomagnesemia . Hypophosphatemia   I have reviewed the medical record, interviewed the patient and family, and examined the patient. The following aspects are pertinent.  Past Medical History:  Diagnosis Date  . COPD (chronic obstructive pulmonary disease) (HCC) 04/23/2020  . Gastrojejunostomy tube status (HCC) 05/31/2020  . GERD (gastroesophageal reflux disease) 07/15/2020  . High cholesterol   . Hypercalcemia 07/15/2020  . Hyperparathyroidism (HCC) 05/31/2020  . Hypertension   . Left hemiplegia (HCC) 04/29/2020  . Non-rheumatic  mitral valve stenosis 12/09/2017  . Paroxysmal A-fib (HCC) 04/23/2020  . S/P mitral valve replacement with metallic valve 04/29/2020   Formatting of this note might be different from the original. St. Jude regent mechanical  Procedures:   1. Mitral valve replacement (25 mm [employer] Jude Regent) (CPT [phone]) 2. Tricuspid valve repair (annuloplasty only)  (30  mm CE Physio ring) (CPT 33464) 4. 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 Procedures:   1. Mitral valve  replacement (25 mm [employer]. Jude Regent) (CPT [phone]) 2. Tricuspid valve repair (annuloplasty only)  (30  mm CE Physio ring) (CPT 33464) 4. 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 Marital status: Single    Spouse name: Not on file  . Number of children: Not on file  . Years of education: Not on file  . Highest education level: Not on file  Occupational History  . Not on file  Tobacco Use  . Smoking status: Never Smoker  . Smokeless tobacco: Never Used  Substance and Sexual Activity  . Alcohol use: Never  . Drug use: Never  . Sexual activity: Not on file  Other Topics Concern  . Not on file  Social History Narrative  . Not on file   Social Determinants of Health   Financial Resource Strain:   . Difficulty of Paying Living Expenses: Not on file  Food Insecurity:   . Worried About Programme researcher, broadcasting/film/video in the Last Year: Not on file  . Ran Out of Food in the Last Year: Not on file  Transportation Needs:   . Lack of Transportation (Medical): Not on file  . Lack of Transportation (Non-Medical): Not on file  Physical Activity:   . Days of Exercise per Week: Not on file  . Minutes of Exercise per Session: Not on file  Stress:   . Feeling of Stress : Not on file  Social Connections:   . Frequency of Communication with Friends and Family: Not on file  . Frequency of Social Gatherings with Friends and Family: Not on file  . Attends Religious Services: Not on file  . Active Member of Clubs or Organizations: Not on file  . Attends Banker Meetings: Not on file  . Marital Status: Not on file   History reviewed. No pertinent family history. Scheduled Meds: . amiodarone  200 mg Oral Daily  . aspirin EC  81 mg Oral Daily  . carvedilol  3.125 mg Oral BID WC  . cinacalcet  90 mg Oral Q breakfast  . feeding supplement (PROSource TF)  45 mL Per Tube Daily  . mirtazapine  30 mg Oral QHS  . multivitamin with minerals  1  tablet Per Tube Daily  . pantoprazole  40 mg Oral Daily  . polyethylene glycol  17 g Oral Daily  . potassium & sodium phosphates  1 packet Oral TID WC & HS  . senna-docusate  1 tablet Oral BID  . sodium bicarbonate  650 mg Oral BID  . warfarin  2 mg Oral ONCE-1600  . Warfarin - Pharmacist Dosing Inpatient   Does not apply q1600   Continuous Infusions: . feeding supplement (OSMOLITE 1.5 CAL)     PRN Meds:.acetaminophen **OR** acetaminophen, iohexol, lidocaine, morphine injection, ondansetron **OR** ondansetron (ZOFRAN) IV, promethazine, traMADol Medications Prior to Admission:  Prior to Admission medications  Medication Sig Start Date End Date Taking? Authorizing Provider  acetaminophen (TYLENOL) 325 MG tablet Take by mouth. 06/24/20  Yes [provider]  amiodarone (PACERONE) 200 MG tablet Take by mouth. 06/22/20 06/22/21 Yes [provider]  amLODipine (NORVASC) 5 MG tablet Take 5 mg by mouth daily. 04/03/20  Yes [provider]  aspirin 81 MG EC tablet Take by mouth. 06/21/20  Yes [provider]  atorvastatin (LIPITOR) 40 MG tablet Take by mouth. 06/21/20 06/21/21 Yes [provider]  carvedilol (COREG) 12.5 MG tablet Take by mouth.   Yes [provider]  Cholecalciferol 50 MCG (2000 UT) TABS Take by mouth. 06/21/20 06/21/21 Yes [provider]  cinacalcet (SENSIPAR) 90 MG tablet Take by mouth. 06/24/20 09/22/20 Yes [provider]  cyclobenzaprine (FLEXERIL) 5 MG tablet Take 1 tablet (5 mg total) by mouth 3 (three) times daily as needed. For muscle spasms.  May cause drowsiness 03/19/18  Yes Triplett, Tammy, PA-C  diclofenac Sodium (VOLTAREN) 1 % GEL Apply topically. 06/21/20 06/21/21 Yes [provider]  furosemide (LASIX) 40 MG tablet Take by mouth. 06/21/20  Yes [provider]  gabapentin (NEURONTIN) 400 MG capsule Take by mouth. 06/21/20 06/21/21 Yes [provider]  mirtazapine (REMERON) 30 MG  tablet Take 30 mg by mouth at bedtime. 07/10/20  Yes [provider]  nystatin (MYCOSTATIN/NYSTOP) powder Apply topically. 06/24/20 06/24/21 Yes [provider]  omeprazole (PRILOSEC) 20 MG capsule Take 20 mg by mouth daily. 06/24/20  Yes [provider]  pantoprazole (PROTONIX) 40 MG tablet Take by mouth.   Yes [provider]  traMADol (ULTRAM) 50 MG tablet Take 1 tablet (50 mg total) by mouth every 6 (six) hours as needed. 03/19/18  Yes Triplett, Tammy, PA-C  traZODone (DESYREL) 50 MG tablet Take by mouth. 06/21/20 06/21/21 Yes [provider]  warfarin (COUMADIN) 2 MG tablet Take by mouth. 06/21/20 06/21/21 Yes [provider]  ipratropium-albuterol (DUONEB) 0.5-2.5 (3) MG/3ML SOLN Inhale into the lungs. Patient not taking: Reported on 07/15/2020 06/21/20 06/16/21  [provider]   No Known Allergies Review of Systems  Unable to perform ROS: Other    Physical Exam Vitals and nursing note reviewed.  Constitutional:      General: She is not in acute distress.    Appearance: She is ill-appearing.  Cardiovascular:     Rate and Rhythm: Normal rate.  Pulmonary:     Effort: Pulmonary effort is normal. No respiratory distress.  Abdominal:     Palpations: Abdomen is soft.  Musculoskeletal:        General: No swelling.  Skin:    General: Skin is warm and dry.  Neurological:     Mental Status: She is alert.     Comments: Oriented to person and place only  Psychiatric:     Comments: Calm and cooperative, not fearful     Vital Signs: BP 127/74 (BP Location: Right Arm)   Pulse 86   Temp 98.4 F (36.9 C) (Oral)   Resp 18   Ht 5' (1.524 m)   Wt 71.1 kg   SpO2 100%   BMI 30.61 kg/m  Pain Scale: 0-10 POSS *See Group Information*: 1-Acceptable,Awake and alert Pain Score: Asleep   SpO2: SpO2: 100 % O2 Device:SpO2: 100 % O2 Flow Rate: .   IO: Intake/output summary:   Intake/Output Summary (Last 24 hours) at 07/23/2020  1551 Last data filed at 07/23/2020 1300 Gross per 24 hour  Intake 0 ml  Output  700 ml  Net -700 ml    LBM: Last BM Date: 07/22/20 Baseline Weight: Weight: 71.7 kg Most recent weight: Weight: 71.1 kg     Palliative Assessment/Data:   Flowsheet Rows     Most Recent Value  Intake Tab  Referral Department Hospitalist  Unit at Time of Referral Cardiac/Telemetry Unit  Palliative Care Primary Diagnosis Other (Comment)  Date Notified 07/18/20  Palliative Care Type New Palliative care  Reason for referral Clarify Goals of Care  Date of Admission 07/15/20  Date first seen by Palliative Care 07/22/20  # of days Palliative referral response time 4 Day(s)  # of days IP prior to Palliative referral 3  Clinical Assessment  Palliative Performance Scale Score 30%  Pain Max last 24 hours Not able to report  Pain Min Last 24 hours Not able to report  Dyspnea Max Last 24 Hours Not able to report  Dyspnea Min Last 24 hours Not able to report  Psychosocial & Spiritual Assessment  Palliative Care Outcomes      Time In: 0920 Time Out: 0950 Time Total: 30 minutes  Greater than 50%  of this time was spent counseling and coordinating care related to the above assessment and plan.  Signed by: Katheran Aweasha A Snigdha Howser, NP   Please contact Palliative Medicine Team phone at [phone] for questions and concerns.  For individual provider: See Loretha StaplerAmion

## 2020-07-23 NOTE — Progress Notes (Signed)
Initial Nutrition Assessment  DOCUMENTATION CODES:   Obesity unspecified  INTERVENTION:  Start Osmolite 1.5 @ 15 ml/hr, advance 10 ml every 8 hrs to goal rate 55 ml/hr (1320 ml/hr) with 45 ml Prosource via tube daily  -Regimen provides 2020 kcal, 94 grams of protein, and 1003 ml free water   -free water flush per MD, recommend 200 ml free water Q8   MVI with minerals via tube daily  Discontinue Boost Breeze   High refeed risk, recommend monitoring magnesium, potassium, and phosphorus daily as noted below  NUTRITION DIAGNOSIS:   Inadequate oral intake related to nausea as evidenced by per patient/family report, meal completion < 50%. -ongoing  GOAL:   Patient will meet greater than or equal to 90% of their needs -progressing; starting TF  MONITOR:   Labs, Supplement acceptance, PO intake, Weight trends, I & O's  REASON FOR ASSESSMENT:   Consult Enteral/tube feeding initiation and management  ASSESSMENT:   64 year old female with medical history significant of COPD, gastrojejunostomy, GERD, HLD, hypercalcemia, hyperparathyroidism, HTN, history of CVA with left-sided hemiplegia, non-rheumatic mitral valve stenosis with metallic valve replacement, s/p tricuspid repair, and recently discharged with home health s/p admission for right MCA stroke presents with 1 week history of feeling nauseas and weakness. Pt admitted with hypercalcemia.  11/8-Pt transported to [facility] IR for GJ-tube replacement.  Ongoing poor meal and supplement acceptance. Meal completion 0-25% x 7 documented intakes 11/2-11/5 and refusing Boost Breeze supplements, will discontinue. RD consulted for tube feeding initiation and management. She is at high risk for refeeding given electrolyte abnormalities as well as very poor po intake. Will advance tube feedings slowly, recommend monitoring magnesium, potassium, and phosphorus daily as tube feeds progress towards goal, MD to replete as needed.   No new weights  this admission, will order daily weights to assess trends. Non-pitting LUE;BLE edema 11/8 per flowsheets. I/Os: 4537.3 ml since admit UOP: 600 ml x 24 hrs  Medications reviewed and include: Coreg, Boost Breeze, Remeron, Protonix, Miralax, Senokot, Phos-Nak 1 packet TID, KCl 40 mEq twice daily, Warfarin Labs: Na 134 (L), P 1.7 (L) 11/8: K/Mg- WNL    Diet Order:   Diet Order            DIET DYS 2 Room service appropriate? Yes; Fluid consistency: Thin  Diet effective now                 EDUCATION NEEDS:   Education needs have been addressed  Skin:  Skin Assessment: Reviewed RN Assessment  Last BM:  pta  Height:   Ht Readings from Last 1 Encounters:  07/16/20 5' (1.524 m)    Weight:   Wt Readings from Last 1 Encounters:  07/16/20 71.1 kg   BMI:  Body mass index is 30.61 kg/m.  Estimated Nutritional Needs:   Kcal:  1900-2100  Protein:  90-105  Fluid:  >/= 1.9 L/day   Lars Masson, RD, LDN Clinical Nutrition After Hours/Weekend Pager # in Amion

## 2020-07-23 NOTE — Progress Notes (Addendum)
PROGRESS NOTE    Annette Rogers  ZOX:[id number] DOB: [birth year] DOA: 07/15/2020 PCP: Patient, No Pcp Per   Chief Complaint  Patient presents with  . Weakness    Brief Narrative:  Prior history of COPD, GERD, hyperlipidemia, hypercalcemia, hypertension, history of CVA with left-sided hemiplegia, hyperparathyroidism, mitral valve replacement, history of tricuspid valve repair presents to the hospital for generalized weakness associated with some nausea, vomiting.  She was found to have significant hypercalcemia in relation to known primary hyperparathyroidism, in addition to hypercalcemia, she had profound hypokalemia, hypomagnesemia and hypophosphatemia. She is currently undergoing IV replacements as she is nauseated with oral supplementation.  She was also found to have a leak in her GJ tube, underwent replacement by IR at [facility] on 07/22/2020.  PT evaluation recommended SNF but patient and family declined and would like to go home with home health PT and OT.  Dietary consulted for starting tube feeds today.  She was initially ready for discharge but started vomiting twice, with green bile.  CT abdomen and pelvis ordered for further evaluation. She will need ambulance transport for discharge home Assessment & Plan:   Principal Problem:   Hypercalcemia Active Problems:   Hypertension   COPD (chronic obstructive pulmonary disease) (HCC)   GERD (gastroesophageal reflux disease)   Hyperparathyroidism (HCC)   Left hemiplegia (HCC)   Non-rheumatic mitral valve stenosis   Paroxysmal A-fib (HCC)   S/P mitral valve replacement with metallic valve   S/P TVR (tricuspid valve repair)   Hypokalemia   Hyponatremia   Normocytic anemia   Hypomagnesemia   Hypophosphatemia   Hypercalcemia would explain her generalized weakness, nausea and vomiting. Patient received a dose of Zometa and IV Lasix on admission Hypercalcemia resolved Started her on IV fluids as she had recurrent vomiting this  morning.    Primary hyperparathyroidism Associated with hypercalcemia and hypophosphatemia.  No indications for surgery at this time. Continue with cinacalcet. PTH wnl.    Hypophosphatemia Replaced. Repeat phos level is 1.7 sodium phos is ordered.  Watch for refeeding syndrome.     Paroxysmal atrial fibrillation Rate controlled.  Continue with amiodarone and Coreg for rate control and  On Coumadin for anti coagulation.    Therapeutic INR    History of stroke with dysphagia and dysarthria and left hemiplegia Patient is on dysphagia 2 diet.  Dietary consult in place. PEJ in place, but it is leaking and gen surgery / IR consulted for replacement.  Patient underwent GJ replacement by IR at [facility] on 07/22/2020   Primary hypertension Blood pressure parameters are optimal  GERD Continue with PPI    COPD No wheezing heard today.    Anemia of chronic disease Baseline hemoglobin appears to be around 11 currently at 9 and stable.  Anemia panel shows low iron, adequate ferritin, folate and vitamin B12. Iron supplementation added.  Stool for occult blood is also ordered    History of mitral valve stenosis s/p MVR with mechanical wall and history of tricuspid valve repair Patient is currently on Coumadin for anticoagulation Therapeutic INR.      Patient reports left arm pain and back pain DVT is ruled out.  Pain control.     Back pain  X rays are unremarkable.  PT eval recommending SNF, but pt refused, wants to go home.   Recurrent vomiting CT of the abdomen and pelvis ordered for further evaluation   Enterococcus UTI:  Sensitive to ampicillin. Completed the course of antibiotics   In view of her multiple medical  problems, poor oral intake, dysphagia, clinical deterioration, palliative care consulted.  Goals of care discussion pending at this time  DVT prophylaxis:coumadin.  Code Status: (Full code Family Communication: discussed with daughter over the phone  on 11/09 Disposition:   Status is: Inpatient  Remains inpatient appropriate because:Ongoing diagnostic testing needed not appropriate for outpatient work up, Unsafe d/c plan and Inpatient level of care appropriate due to severity of illness   Dispo: The patient is from: Home              Anticipated d/c is to: Home              Anticipated d/c date is: 1 day              Patient currently is not medically stable to d/c.       Consultants:   IR  PALLIATIVE CARE.    Procedures: GJ replacement by IR and [facility] on 07/22/2020  CT of the abdomen pelvis contrast   Antimicrobials: None    Subjective: Vomiting green bile/brown liquid, no chest pain or shortness of breath Objective: Vitals:   07/22/20 1409 07/22/20 2104 07/23/20 0406 07/23/20 1326  BP: 135/67 135/81 (!) 136/97 127/74  Pulse: (!) 104 (!) 103 87 86  Resp: 18 18  18   Temp: 98.3 F (36.8 C) 99.1 F (37.3 C) 98.3 F (36.8 C) 98.4 F (36.9 C)  TempSrc:  Oral Oral Oral  SpO2: 100% 100% 99% 100%  Weight:      Height:        Intake/Output Summary (Last 24 hours) at 07/23/2020 1547 Last data filed at 07/23/2020 1300 Gross per 24 hour  Intake 0 ml  Output 700 ml  Net -700 ml   Filed Weights   07/15/20 1922 07/16/20 0347  Weight: 71.7 kg 71.1 kg    Examination:  General exam: Alert and comfortable, mild distress from vomiting Respiratory system: Clear to auscultation bilaterally, no wheezing or rhonchi Cardiovascular system: S1-S2 heard, no JVD, Gastrointestinal system: Abdomen is soft, mildly tender around the GJ tube, nondistended, bowel sounds normal Central nervous system: Alert and oriented to person and place, grossly nonfocal Extremities: Left hemiplegia, swelling of the left foot and left upper extremity present Skin: No rashes seen Psychiatry:.  Mood is appropriate    Data Reviewed: I have personally reviewed following labs and imaging studies  CBC: Recent Labs  Lab 07/18/20 2151  07/22/20 0650  WBC 7.2 7.6  HGB 8.6* 9.1*  HCT 27.7* 30.4*  MCV 86.3 85.9  PLT 242 316    Basic Metabolic Panel: Recent Labs  Lab 07/18/20 0030 07/18/20 0030 07/18/20 0756 07/18/20 2151 07/19/20 0706 07/20/20 0647 07/21/20 0543 07/21/20 0544 07/22/20 0650 07/23/20 0603  NA 138   < > 140  139   < > 138 136 135  --  134* 136  K 2.4*   < > 2.5*  2.4*   < > 2.9* 2.9* 3.4*  --  3.7 4.1  CL 107   < > 108  108   < > 105 103 106  --  108 109  CO2 24   < > 20*  21*   < > 22 21* 18*  --  17* 14*  GLUCOSE 78   < > 72  75   < > 85 86 85  --  71 63*  BUN 14   < > 14  14   < > 14 9 7*  --  6* 10  CREATININE 0.70   < > 0.67  0.74   < > 0.64 0.60 0.66  --  0.68 0.86  CALCIUM 9.3   < > 9.3  9.1   < > 8.3* 7.8* 7.3*  --  7.3* 7.7*  MG 1.0*  --  2.2  --   --  1.2* 1.6*  --  2.2  --   PHOS 2.0*   < > 1.8*  1.7*   < > 2.2* 2.1*  --  2.1* 1.7* 1.7*   < > = values in this interval not displayed.    GFR: Estimated Creatinine Clearance: 56.6 mL/min (by C-G formula based on SCr of 0.86 mg/dL).  Liver Function Tests: Recent Labs  Lab 07/18/20 0030 07/18/20 0756 07/19/20 0706 07/20/20 0647 07/23/20 1415  AST  --   --   --  38 51*  ALT  --   --   --  18 24  ALKPHOS  --   --   --  86 93  BILITOT  --   --   --  0.9 1.1  PROT  --   --   --  6.4* 6.6  ALBUMIN 2.6* 2.5* 2.6* 2.8* 2.7*    CBG: No results for input(s): GLUCAP in the last 168 hours.   Recent Results (from the past 240 hour(s))  Respiratory Panel by RT PCR (Flu A&B, Covid) - Urine, Clean Catch     Status: None   Collection Time: 07/15/20 11:18 PM   Specimen: Urine, Clean Catch; Nasopharyngeal  Result Value Ref Range Status   SARS Coronavirus 2 by RT PCR NEGATIVE NEGATIVE Final    Comment: (NOTE) SARS-CoV-2 target nucleic acids are NOT DETECTED.  The SARS-CoV-2 RNA is generally detectable in upper respiratoy specimens during the acute phase of infection. The lowest concentration of SARS-CoV-2 viral copies this  assay can detect is 131 copies/mL. A negative result does not preclude SARS-Cov-2 infection and should not be used as the sole basis for treatment or other patient management decisions. A negative result may occur with  improper specimen collection/handling, submission of specimen other than nasopharyngeal swab, presence of viral mutation(s) within the areas targeted by this assay, and inadequate number of viral copies (<131 copies/mL). A negative result must be combined with clinical observations, patient history, and epidemiological information. The expected result is Negative.  Fact Sheet for Patients:  https://www.moore.com/  Fact Sheet for Healthcare Providers:  https://www.young.biz/  This test is no t yet approved or cleared by the [place] FDA and  has been authorized for detection and/or diagnosis of SARS-CoV-2 by FDA under an Emergency Use Authorization (EUA). This EUA will remain  in effect (meaning this test can be used) for the duration of the COVID-19 declaration under Section 564(b)(1) of the Act, 21 U.S.C. section 360bbb-3(b)(1), unless the authorization is terminated or revoked sooner.     Influenza A by PCR NEGATIVE NEGATIVE Final   Influenza B by PCR NEGATIVE NEGATIVE Final    Comment: (NOTE) The Xpert Xpress SARS-CoV-2/FLU/RSV assay is intended as an aid in  the diagnosis of influenza from Nasopharyngeal swab specimens and  should not be used as a sole basis for treatment. Nasal washings and  aspirates are unacceptable for Xpert Xpress SARS-CoV-2/FLU/RSV  testing.  Fact Sheet for Patients: https://www.moore.com/  Fact Sheet for Healthcare Providers: https://www.young.biz/  This test is not yet approved or cleared by the [place] FDA and  has been authorized for detection and/or diagnosis of SARS-CoV-2 by  FDA  under an Emergency Use Authorization (EUA). This EUA will  remain  in effect (meaning this test can be used) for the duration of the  Covid-19 declaration under Section 564(b)(1) of the Act, 21  U.S.C. section 360bbb-3(b)(1), unless the authorization is  terminated or revoked. Performed at [facility], [address], [place], [place] [address]   Urine culture     Status: Abnormal   Collection Time: 07/15/20 11:22 PM   Specimen: Urine, Clean Catch  Result Value Ref Range Status   Specimen Description   Final    URINE, CLEAN CATCH Performed at [facility], [address] [place], [place] [address]    Special Requests   Final    NONE Performed at [facility], [address], [place], [place] [address]    Culture >=100,000 COLONIES/mL ENTEROCOCCUS FAECALIS (A)  Final   Report Status 07/18/2020 FINAL  Final   Organism ID, Bacteria ENTEROCOCCUS FAECALIS (A)  Final      Susceptibility   Enterococcus faecalis - MIC*    AMPICILLIN <=2 SENSITIVE Sensitive     NITROFURANTOIN <=16 SENSITIVE Sensitive     VANCOMYCIN 1 SENSITIVE Sensitive     * >=100,000 COLONIES/mL ENTEROCOCCUS FAECALIS         Radiology Studies: IR GJ Tube Change  Result Date: 07/22/2020 INDICATION: Malpositioned and poorly functioning gastrojejunal feeding tube with nausea and vomiting. EXAM: EXCHANGE OF GASTROJEJUNAL FEEDING TUBE UNDER FLUOROSCOPY MEDICATIONS: None ANESTHESIA/SEDATION: None CONTRAST:  10 mL Omnipaque 300 FLUOROSCOPY TIME:  Fluoroscopy Time: 2 minutes and 18 seconds. COMPLICATIONS: None immediate. PROCEDURE: Informed written consent was obtained from the patient after a thorough discussion of the procedural risks, benefits and alternatives. All questions were addressed. Maximal Sterile Barrier Technique was utilized including caps, mask, sterile gowns, sterile gloves, sterile drape, hand hygiene and skin antiseptic. A timeout was performed prior to the initiation of the procedure. The pre-existing 25 French gastrojejunal feeding tube was imaged under fluoroscopy  and injected with contrast. The retention balloon was deflated, the tube was retracted then removed over a guidewire. A 5 French catheter was advanced over the guidewire and used to further gain access through the duodenum and into the proximal jejunum. Over the wire, a new 20 French balloon retention single-lumen jejunostomy catheter was advanced. Catheter position was confirmed by fluoroscopy after contrast injection. The catheter was flushed. The retention balloon within the stomach was inflated with 9 mL of saline. FINDINGS: Initial fluoroscopy and contrast injection demonstrates that the pre-existing gastrojejunal tube has recoiled mostly into the stomach with the catheter tip located in the region of the duodenal bulb. The tube remains patent. After exchange, a single lumen, slightly larger jejunostomy catheter was able to be advanced through the stomach, duodenum and well into the proximal jejunum. This tube may be used immediately for feeding and medication. IMPRESSION: Exchange of retracted 37 French gastrojejunal feeding tube for new 20 French single-lumen balloon retention jejunostomy catheter advanced through the stomach and duodenum into the proximal jejunum. Electronically Signed   By: Irish Lack M.D.   On: 07/22/2020 14:09        Scheduled Meds: . amiodarone  200 mg Oral Daily  . aspirin EC  81 mg Oral Daily  . carvedilol  3.125 mg Oral BID WC  . cinacalcet  90 mg Oral Q breakfast  . feeding supplement (PROSource TF)  45 mL Per Tube Daily  . mirtazapine  30 mg Oral QHS  . multivitamin with minerals  1 tablet Per Tube Daily  .  pantoprazole  40 mg Oral Daily  . polyethylene glycol  17 g Oral Daily  . potassium & sodium phosphates  1 packet Oral TID WC & HS  . senna-docusate  1 tablet Oral BID  . sodium bicarbonate  650 mg Oral BID  . warfarin  2 mg Oral ONCE-1600  . Warfarin - Pharmacist Dosing Inpatient   Does not apply q1600   Continuous Infusions: . feeding supplement  (OSMOLITE 1.5 CAL)       LOS: 7 days       Kathlen ModyVijaya Aaria Happ, MD [facility]   To contact the attending provider between 7A-7P or the covering provider during after hours 7P-7A, please log into the web site [email or web address] and access using universal Industry password for that web site. If you do not have the password, please call the hospital operator.  07/23/2020, 3:47 PM

## 2020-07-23 NOTE — Progress Notes (Signed)
ANTICOAGULATION CONSULT NOTE -   Pharmacy Consult for Coumadin Indication: MVR and Afib  No Known Allergies  Patient Measurements: Height: 5' (152.4 cm) Weight: 71.1 kg (156 lb 12 oz) IBW/kg (Calculated) : 45.5  Vital Signs: Temp: 98.3 F (36.8 C) (11/09 0406) Temp Source: Oral (11/09 0406) BP: 136/97 (11/09 0406) Pulse Rate: 87 (11/09 0406)  Labs: Recent Labs    07/21/20 0543 07/21/20 0544 07/22/20 0650 07/23/20 0603  HGB  --   --  9.1*  --   HCT  --   --  30.4*  --   PLT  --   --  316  --   LABPROT  --  34.7* 32.5* 28.6*  INR  --  3.6* 3.3* 2.8*  CREATININE 0.66  --  0.68  --     Estimated Creatinine Clearance: 60.8 mL/min (by C-G formula based on SCr of 0.68 mg/dL).   Assessment: 64yo female c/o weakness and nausea x1wk, admitted for hypercalcemia, to continue Coumadin for MVR/Afib.   Home dose: warfarin 2mg  daily Drug Interactions (major): amiodarone 200mg  daily   INR: now 2.8->in desired goal range 11/8 warfarin dose not charted as given by RN hgb 9.1   Goal of Therapy:  INR 2.5-3.5   Plan:  Warfarin 2 mg x 1 dose.  Daily INR and every other day CBC Monitor for S/S of bleeding  , PharmD Clinical Pharmacist 07/23/2020 11:35 AM

## 2020-07-23 NOTE — Progress Notes (Signed)
  Speech Language Pathology Treatment: Dysphagia  Patient Details Name: Annette Rogers MRN: [id number] DOB: Dec 10, 1955 Today's Date: 07/23/2020 Time: 9563-8756 SLP Time Calculation (min) (ACUTE ONLY): 22 min  Assessment / Plan / Recommendation Clinical Impression  Pt seen for ongoing dysphagia intervention. She has not been taking much by mouth due to reports of nausea and cannot be convinced if she doesn't want to take it. Today, she was agreeable to sips of liquids only. She had [employer] that someone brought her, but she declined. She does not present with signs or symptoms of aspiration on limited trials. Pt with some reduced secretion management on the left side, but is sensate and wipes away with a tissue. She had her feeding tube replaced yesterday. Continue to offer D2 and thin liquids and supplement per RD as needed. SLP will sign off.    HPI HPI: Annette Rogers is a 64 y.o. female with medical history significant of COPD, gastrojejunostomy, GERD, hyperlipidemia, hypercalcemia, hyperparathyroidism, hypertension, history of CVA with left-sided hemiplegia, nonrheumatic mitral valve stenosis with metallic valve replacement, history of tricuspid valve repair. Patient presented secondary to weakness and nausea and found to have significant hypercalcemia in relation to known primary hyperparathyroidism.      SLP Plan  All goals met;Discharge SLP treatment due to (comment)       Recommendations  Diet recommendations: Dysphagia 2 (fine chop);Thin liquid Liquids provided via: Cup;Straw Medication Administration: Whole meds with puree Supervision: Patient able to self feed Compensations: Slow rate;Small sips/bites;Follow solids with liquid Postural Changes and/or Swallow Maneuvers: Seated upright 90 degrees;Upright 30-60 min after meal                Oral Care Recommendations: Oral care BID;Staff/trained caregiver to provide oral care Follow up Recommendations: Skilled Nursing facility;24  hour supervision/assistance SLP Visit Diagnosis: Dysphagia, unspecified (R13.10) Plan: All goals met;Discharge SLP treatment due to (comment)       Thank you,  Genene Churn, [place]                 [place] 07/23/2020, 5:14 PM

## 2020-07-23 NOTE — Care Management Important Message (Signed)
Important Message  Patient Details  Name: Annette Rogers MRN: [id number] Date of Birth: 1956/04/08   Medicare Important Message Given:  Yes     Corey Harold 07/23/2020, 3:11 PM

## 2020-07-23 NOTE — Progress Notes (Signed)
Physical Therapy Treatment Patient Details Name: Annette Rogers MRN: [id number] DOB: 02-13-1956 Today's Date: 07/23/2020    History of Present Illness Annette Rogers is a 64 y.o. 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     PT Comments    Pt limited by abdominal pain and LUE pain this session. Pt cued on rolling, therapist educating pt to reach with RUE to L bedrail and push through bent RLE to assist in rotating trunk into L sidelying with max assist to achieve. Pt assisted up in bed with bed assist and pt pushing through RLE and pulling with RUE to reposition in bed. Pt tolerates RLE strengthening exercises with good motor control and strength noted. Pt declines to transfer to sitting EOB or OOB this session due to recently receiving pain medication and wanting to rest. Therapist positioned LUE with pillow for support and pain relief. Pt will benefit from continued physical therapy in hospital and recommendations below to increase strength, balance, endurance for safe ADLs and gait.   Follow Up Recommendations  Home health PT;Supervision for mobility/OOB;Supervision - Intermittent     Equipment Recommendations  None recommended by PT    Recommendations for Other Services       Precautions / Restrictions Precautions Precautions: Fall Precaution Comments: PEG Restrictions Weight Bearing Restrictions: No    Mobility  Bed Mobility Overal bed mobility: Needs Assistance Bed Mobility: Rolling Rolling: Max  assist  General bed mobility comments: increased time, pt able to reach with RUE to L side and cued to bend R knee to assist in rotating trunk into L sidelying, max A to achieve; max A to scoot up in bed with pt cued to push through R heel and pull with RUE with good carryover  Transfers  General transfer comment: pt uses hoyer at home, declines transfer  Ambulation/Gait  General Gait Details: pt nonambulatory   Stairs             Wheelchair Mobility    Modified Rankin (Stroke Patients Only)       Balance  Sitting balance - Comments: pt declines to sit EOB this session                 Cognition Arousal/Alertness: Awake/alert Behavior During Therapy: WFL for tasks assessed/performed Overall Cognitive Status: Within Functional Limits for tasks assessed                   Exercises General Exercises - Lower Extremity Ankle Circles/Pumps: Supine;AROM;Strengthening;Right;10 reps Quad Sets: Supine;AROM;Strengthening;Right;10 reps Heel Slides: Supine;AROM;Strengthening;Right;10 reps    General Comments        Pertinent Vitals/Pain Pain Assessment: Faces Faces Pain Scale: Hurts little more Pain Location: LUE and abdomen Pain Descriptors / Indicators: Grimacing;Guarding;Sore Pain Intervention(s): Limited activity within patient's tolerance;Monitored during session;Repositioned;Premedicated before session    Home Living                      Prior Function            PT Goals (current goals can now be found in the care plan section) Acute Rehab PT Goals  Patient Stated Goal: To return home  PT Goal Formulation: With patient Time For Goal Achievement: 08/01/20 Potential to Achieve Goals: Fair Progress towards PT goals: Progressing toward goals (limited due to pain this session)    Frequency    Min 2X/week      PT Plan Current plan remains appropriate    Co-evaluation              AM-PAC PT "6 Clicks" Mobility   Outcome  Measure  Help needed turning from your back to your side while in a flat bed without using bedrails?: A Lot Help needed moving from lying on your back to sitting on the side of a flat bed without using bedrails?: Total Help needed moving to and from a bed to a chair (including a wheelchair)?: Total Help needed standing up from a chair using your arms (e.g., wheelchair or bedside chair)?: Total Help needed to walk in hospital room?: Total Help needed climbing 3-5 steps with a railing? : Total 6 Click Score: 7    End of Session   Activity Tolerance: Patient limited by fatigue;Patient limited by pain Patient left: in bed;with call bell/phone within reach;with bed alarm set Nurse Communication: Mobility status PT Visit Diagnosis: Unsteadiness on feet (R26.81);Other abnormalities of gait and mobility (R26.89);Muscle weakness (generalized) (M62.81)     Time: 3818-2993 PT Time Calculation (min) (ACUTE ONLY): 13 min  Charges:  $Therapeutic Activity: 8-22 mins                      Tori Octave [place] PT, DPT 07/23/20, 10:24 AM [phone]

## 2020-07-24 ENCOUNTER — Encounter (INDEPENDENT_AMBULATORY_CARE_PROVIDER_SITE_OTHER): Payer: Self-pay | Admitting: Gastroenterology

## 2020-07-24 DIAGNOSIS — K219 Gastro-esophageal reflux disease without esophagitis: Secondary | ICD-10-CM

## 2020-07-24 LAB — GLUCOSE, CAPILLARY
Glucose-Capillary: 63 mg/dL — ABNORMAL LOW (ref 70–99)
Glucose-Capillary: 105 mg/dL — ABNORMAL HIGH (ref 70–99)
Glucose-Capillary: 88 mg/dL (ref 70–99)
Glucose-Capillary: 86 mg/dL (ref 70–99)
Glucose-Capillary: 106 mg/dL — ABNORMAL HIGH (ref 70–99)
Glucose-Capillary: 82 mg/dL (ref 70–99)

## 2020-07-24 LAB — COMPREHENSIVE METABOLIC PANEL
ALT: 29 U/L (ref 0–44)
AST: 57 U/L — ABNORMAL HIGH (ref 15–41)
Albumin: 2.8 g/dL — ABNORMAL LOW (ref 3.5–5.0)
Alkaline Phosphatase: 97 U/L (ref 38–126)
Anion gap: 9 (ref 5–15)
BUN: 10 mg/dL (ref 8–23)
CO2: 16 mmol/L — ABNORMAL LOW (ref 22–32)
Calcium: 7.7 mg/dL — ABNORMAL LOW (ref 8.9–10.3)
Chloride: 105 mmol/L (ref 98–111)
Creatinine, Ser: 0.94 mg/dL (ref 0.44–1.00)
GFR, Estimated: >60 mL/min (ref >60)
Glucose, Bld: 88 mg/dL (ref 70–99)
Potassium: 3.6 mmol/L (ref 3.5–5.1)
Sodium: 130 mmol/L — ABNORMAL LOW (ref 135–145)
Total Bilirubin: 0.7 mg/dL (ref 0.3–1.2)
Total Protein: 6.7 g/dL (ref 6.5–8.1)

## 2020-07-24 LAB — CBC
HCT: 30.4 % — ABNORMAL LOW (ref 36.0–46.0)
Hemoglobin: 9 g/dL — ABNORMAL LOW (ref 12.0–15.0)
MCH: 25.9 pg — ABNORMAL LOW (ref 26.0–34.0)
MCHC: 29.6 g/dL — ABNORMAL LOW (ref 30.0–36.0)
MCV: 87.4 fL (ref 80.0–100.0)
Platelets: 333 10*3/uL (ref 150–400)
RBC: 3.48 MIL/uL — ABNORMAL LOW (ref 3.87–5.11)
RDW: 16.3 % — ABNORMAL HIGH (ref 11.5–15.5)
WBC: 8.5 10*3/uL (ref 4.0–10.5)
nRBC: 0 % (ref 0.0–0.2)

## 2020-07-24 LAB — PROTIME-INR
INR: 2.8 — ABNORMAL HIGH (ref 0.8–1.2)
Prothrombin Time: 28.9 seconds — ABNORMAL HIGH (ref 11.4–15.2)

## 2020-07-24 LAB — PHOSPHORUS: Phosphorus: 1.2 mg/dL — ABNORMAL LOW (ref 2.5–4.6)

## 2020-07-24 LAB — MAGNESIUM: Magnesium: 1.8 mg/dL (ref 1.7–2.4)

## 2020-07-24 MED ORDER — CARVEDILOL 3.125 MG PO TABS
3.1250 mg | ORAL_TABLET | Freq: Two times a day (BID) | ORAL | 1 refills | Status: DC
Start: 2020-07-24 — End: 2020-09-23

## 2020-07-24 MED ORDER — POTASSIUM & SODIUM PHOSPHATES 280-160-250 MG PO PACK
1.0000 | PACK | Freq: Three times a day (TID) | ORAL | Status: DC
  Administered 2020-07-24 (×2): 1 via ORAL
  Filled 2020-07-24: qty 1

## 2020-07-24 MED ORDER — SODIUM BICARBONATE 650 MG PO TABS: 650.0000 mg | ORAL_TABLET | Freq: Two times a day (BID) | ORAL | 1 refills | Status: AC

## 2020-07-24 MED ORDER — POTASSIUM & SODIUM PHOSPHATES 280-160-250 MG PO PACK
1.0000 | PACK | Freq: Three times a day (TID) | ORAL | 0 refills | Status: DC
Start: 2020-07-24 — End: 2020-09-23

## 2020-07-24 MED ORDER — POTASSIUM PHOSPHATES 15 MMOLE/5ML IV SOLN
20.0000 mmol | Freq: Once | INTRAVENOUS | Status: DC
  Administered 2020-07-24: 20 mmol via INTRAVENOUS
  Filled 2020-07-24: qty 6.67

## 2020-07-24 MED ORDER — ADULT MULTIVITAMIN W/MINERALS CH: 1.0000 | ORAL_TABLET | Freq: Every day | ORAL | Status: AC

## 2020-07-24 MED ORDER — WARFARIN SODIUM 2 MG PO TABS
2.0000 mg | ORAL_TABLET | Freq: Once | ORAL | Status: AC
  Administered 2020-07-24: 2 mg via ORAL
  Filled 2020-07-24: qty 1

## 2020-07-24 MED ORDER — SENNOSIDES-DOCUSATE SODIUM 8.6-50 MG PO TABS: 1.0000 | ORAL_TABLET | Freq: Two times a day (BID) | ORAL | 1 refills | Status: AC

## 2020-07-24 MED ORDER — POLYETHYLENE GLYCOL 3350 17 G PO PACK: 17.0000 g | PACK | Freq: Every day | ORAL | 0 refills | Status: AC | PRN

## 2020-07-24 MED ORDER — OSMOLITE 1.5 CAL PO LIQD: 1000.0000 mL | ORAL | 10 refills | Status: AC

## 2020-07-24 MED ORDER — POTASSIUM & SODIUM PHOSPHATES 280-160-250 MG PO PACK: 1.0000 | PACK | Freq: Three times a day (TID) | ORAL | Status: DC

## 2020-07-24 NOTE — Discharge Summary (Addendum)
Physician Discharge Summary  Annette Rogers LNL:[id number] DOB: February 26, 1956 DOA: 07/15/2020  Admit date: 07/15/2020 Discharge date: 07/24/2020  Admitted From:  Home Disposition: Home with HH (declines SNF)  Recommendations for Outpatient Follow-up:  1. Follow up with PCP in 1 weeks 2. Please follow BMP, Mg, Phos in 5-7 days   Home Health: PT, OT, RN, SLP  Discharge Condition: STABLE   CODE STATUS: FULL    Brief Hospitalization Summary: Please see all hospital notes, images, labs for full details of the hospitalization. ADMISSION HPI: Annette Rogers is a 64 y.o. 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  She is unable to elaborate much on her symptoms, but answers simple questions.  She denies fever, chills, but feels fatigued.  No sore throat, rhinorrhea, dyspnea, wheezing or hemoptysis.  Nuys chest pain, palpitations, diaphoresis, PND, orthopnea.  Denies abdominal pain, diarrhea, constipation, melena or hematochezia.  She denies dysuria, frequency or hematuria to her knowledge.  ED Course: 97.7 F, pulse 108, respiration 15, blood pressure 114/52 mmHg and O2 sat 98% on room air were her vital signs at the time of arrival.  A urinalysis showed ketonuria of 80 and proteinuria 30 mg/dL, small hemoglobinuria moderate leukocyte esterase.  6-10 RBC and more than 50 WBC per hpf with rare bacteria on microscopic examination.  CBC had a white count of 10.7 with an unremarkable differential, hemoglobin 11.3 g/dL platelets  408.  CMP sodium 134, potassium 3.2, chloride 96 and CO2 25 mmol/L.BUN was 26, creatinine 0.73 and calcium of 14.5 mg/dL.  AST was 42 units/L and total bilirubin 1.5 mg/dL.  The rest of the LFTs were within normal range.  Magnesium was 1.4 and phosphorus 1.9 mg/dL.  HOSPITAL COURSE Brief Narrative:  Prior history of COPD, GERD, hyperlipidemia, hypercalcemia, hypertension, history of CVA with left-sided hemiplegia, hyperparathyroidism, mitral valve replacement, history of tricuspid valve repair presents to the hospital for generalized weakness associated with some nausea, vomiting.  She was found to have significant hypercalcemia in relation to known primary hyperparathyroidism, in addition to hypercalcemia, she had profound hypokalemia, hypomagnesemia and hypophosphatemia. She is currently undergoing IV replacements as she is nauseated with oral supplementation.  She was also found to have a leak in her GJ tube, underwent replacement by IR at [facility] on 07/22/2020.  PT evaluation recommended SNF but patient and family declined and would like to go home with home health PT and OT.  Dietary consulted for starting tube feeds today.  She was initially ready for discharge but started vomiting twice, with green bile.  CT abdomen and pelvis ordered for further evaluation. She will need ambulance transport for discharge home Assessment & Plan:   Principal Problem:   Hypercalcemia Active Problems:   Hypertension   COPD (chronic obstructive pulmonary disease) (HCC)   GERD (gastroesophageal reflux disease)   Hyperparathyroidism (HCC)   Left hemiplegia (HCC)   Non-rheumatic mitral valve stenosis   Paroxysmal A-fib (HCC)   S/P mitral valve replacement with metallic valve   S/P TVR (tricuspid valve repair)   Hypokalemia   Hyponatremia   Normocytic anemia   Hypomagnesemia   Hypophosphatemia  Hypercalcemia would  explain her generalized weakness, nausea and vomiting. Patient received a dose of Zometa and IV  Lasix on admission Hypercalcemia resolved Started her on IV fluids as she had recurrent vomiting this morning.  Primary hyperparathyroidism Associated with hypercalcemia and hypophosphatemia.  No indications for surgery at this time. Continue with cinacalcet. PTH wnl.  Ambulatory referral to endocrinologist made  Hypophosphatemia Replaced. Repeat phos level is 1.7 sodium phos is ordered.  Watch for refeeding syndrome.   IV phos was given 11/10, Pt lost IV and refused to have it replaced, changed to oral phos replacement  Paroxysmal atrial fibrillation Rate controlled.  Continue with amiodarone and Coreg for rate control and  On Coumadin for anti coagulation.    Therapeutic INR  History of stroke with dysphagia and dysarthria and left hemiplegia Patient is on dysphagia 2 diet.  Dietary consult in place. PEJ in place, but it is leaking and gen surgery / IR consulted for replacement.  Patient underwent GJ replacement by IR at [facility] on 07/22/2020  Primary hypertension Blood pressure parameters are optimal  GERD Continue with PPI  COPD No wheezing heard today.   Anemia of chronic disease Baseline hemoglobin appears to be around 11 currently at 9 and stable.  Anemia panel shows low iron, adequate ferritin, folate and vitamin B12. Iron supplementation added.  Stool for occult blood is also ordered Outpatient referral to GI made.   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   Patient reports left arm pain and back pain DVT is ruled out.  Pain control.   Back pain  X rays are unremarkable.  PT eval recommending SNF, but pt refused, wants to go home.   Recurrent vomiting CT of the abdomen and pelvis ordered for further evaluation  Enterococcus UTI:  Sensitive to ampicillin. Completed the course of antibiotics  In view of her  multiple medical problems, poor oral intake, dysphagia, clinical deterioration, palliative care consulted.  Goals of care discussion with palliative care team was completed and plan is to continue full scope care for now.    DVT prophylaxis:coumadin.  Code Status: (Full code) Family Communication: discussed with daughter Disposition: Home with HH (declined SNF)   Discharge Diagnoses:  Principal Problem:   Hypercalcemia Active Problems:   Hypertension   COPD (chronic obstructive pulmonary disease) (HCC)   GERD (gastroesophageal reflux disease)   Hyperparathyroidism (HCC)   Left hemiplegia (HCC)   Non-rheumatic mitral valve stenosis   Paroxysmal A-fib (HCC)   S/P mitral valve replacement with metallic valve   S/P TVR (tricuspid valve repair)   Hypokalemia   Hyponatremia   Normocytic anemia   Hypomagnesemia   Hypophosphatemia   Malfunction of percutaneous endoscopic gastrostomy (PEG) tube (HCC)   Goals of care, counseling/discussion   Palliative care by specialist   Discharge Instructions: Discharge Instructions    Ambulatory referral to Endocrinology   Complete by: As directed    Ambulatory referral to Gastroenterology   Complete by: As directed      Allergies as of 07/24/2020   No Known Allergies     Medication List    STOP taking these medications   amLODipine 5 MG tablet Commonly known as: NORVASC   furosemide 40 MG tablet Commonly known as: LASIX   gabapentin 400 MG capsule Commonly known as: NEURONTIN   ipratropium-albuterol 0.5-2.5 (3) MG/3ML Soln Commonly known as: DUONEB   omeprazole 20 MG capsule Commonly known  as: PRILOSEC   ondansetron 4 MG disintegrating tablet Commonly known as: ZOFRAN-ODT   traZODone 50 MG tablet Commonly known as: DESYREL     TAKE these medications   acetaminophen 325 MG tablet Commonly known as: TYLENOL Take by mouth.   amiodarone 200 MG tablet Commonly known as: PACERONE Take by mouth.   aspirin 81 MG EC  tablet Take by mouth.   atorvastatin 40 MG tablet Commonly known as: LIPITOR Take by mouth.   carvedilol 3.125 MG tablet Commonly known as: COREG Take 1 tablet (3.125 mg total) by mouth 2 (two) times daily with a meal. What changed:   medication strength  how much to take  when to take this   Cholecalciferol 50 MCG (2000 UT) Tabs Take by mouth.   cinacalcet 90 MG tablet Commonly known as: SENSIPAR Take by mouth.   cyclobenzaprine 5 MG tablet Commonly known as: FLEXERIL Take 1 tablet (5 mg total) by mouth 3 (three) times daily as needed. For muscle spasms.  May cause drowsiness   diclofenac Sodium 1 % Gel Commonly known as: VOLTAREN Apply topically.   feeding supplement (OSMOLITE 1.5 CAL) Liqd Place 1,000 mLs into feeding tube continuous.   mirtazapine 30 MG tablet Commonly known as: REMERON Take 30 mg by mouth at bedtime.   multivitamin with minerals Tabs tablet Place 1 tablet into feeding tube daily. Start taking on: July 25, 2020   nystatin powder Commonly known as: MYCOSTATIN/NYSTOP Apply topically.   pantoprazole 40 MG tablet Commonly known as: PROTONIX Take by mouth.   polyethylene glycol 17 g packet Commonly known as: MIRALAX / GLYCOLAX Take 17 g by mouth daily as needed for mild constipation.   potassium & sodium phosphates 280-160-250 MG Pack Commonly known as: PHOS-NAK Take 1 packet by mouth 4 (four) times daily -  with meals and at bedtime.   senna-docusate 8.6-50 MG tablet Commonly known as: Senokot-S Take 1 tablet by mouth 2 (two) times daily.   sodium bicarbonate 650 MG tablet Take 1 tablet (650 mg total) by mouth 2 (two) times daily.   traMADol 50 MG tablet Commonly known as: ULTRAM Take 1 tablet (50 mg total) by mouth every 6 (six) hours as needed.   warfarin 2 MG tablet Commonly known as: COUMADIN Take by mouth.       Follow-up Information    [facility] Follow up.   Specialty: Home Health Services Why:  PT, OT, ST, RN Contact information: [address] [place] [address] [phone]        [facility] Follow up.   Why: Outpatient palliative Contact information: [address] [facility] [place] [address] [phone]             No Known Allergies Allergies as of 07/24/2020   No Known Allergies     Medication List    STOP taking these medications   amLODipine 5 MG tablet Commonly known as: NORVASC   furosemide 40 MG tablet Commonly known as: LASIX   gabapentin 400 MG capsule Commonly known as: NEURONTIN   ipratropium-albuterol 0.5-2.5 (3) MG/3ML Soln Commonly known as: DUONEB   omeprazole 20 MG capsule Commonly known as: PRILOSEC   ondansetron 4 MG disintegrating tablet Commonly known as: ZOFRAN-ODT   traZODone 50 MG tablet Commonly known as: DESYREL     TAKE these medications   acetaminophen 325 MG tablet Commonly known as: TYLENOL Take by mouth.   amiodarone 200 MG tablet Commonly known as: PACERONE Take by mouth.  aspirin 81 MG EC tablet Take by mouth.   atorvastatin 40 MG tablet Commonly known as: LIPITOR Take by mouth.   carvedilol 3.125 MG tablet Commonly known as: COREG Take 1 tablet (3.125 mg total) by mouth 2 (two) times daily with a meal. What changed:   medication strength  how much to take  when to take this   Cholecalciferol 50 MCG (2000 UT) Tabs Take by mouth.   cinacalcet 90 MG tablet Commonly known as: SENSIPAR Take by mouth.   cyclobenzaprine 5 MG tablet Commonly known as: FLEXERIL Take 1 tablet (5 mg total) by mouth 3 (three) times daily as needed. For muscle spasms.  May cause drowsiness   diclofenac Sodium 1 % Gel Commonly known as: VOLTAREN Apply topically.   feeding supplement (OSMOLITE 1.5 CAL) Liqd Place 1,000 mLs into feeding tube continuous.   mirtazapine 30 MG tablet Commonly known as: REMERON Take 30 mg by mouth at bedtime.   multivitamin with minerals Tabs  tablet Place 1 tablet into feeding tube daily. Start taking on: July 25, 2020   nystatin powder Commonly known as: MYCOSTATIN/NYSTOP Apply topically.   pantoprazole 40 MG tablet Commonly known as: PROTONIX Take by mouth.   polyethylene glycol 17 g packet Commonly known as: MIRALAX / GLYCOLAX Take 17 g by mouth daily as needed for mild constipation.   potassium & sodium phosphates 280-160-250 MG Pack Commonly known as: PHOS-NAK Take 1 packet by mouth 4 (four) times daily -  with meals and at bedtime.   senna-docusate 8.6-50 MG tablet Commonly known as: Senokot-S Take 1 tablet by mouth 2 (two) times daily.   sodium bicarbonate 650 MG tablet Take 1 tablet (650 mg total) by mouth 2 (two) times daily.   traMADol 50 MG tablet Commonly known as: ULTRAM Take 1 tablet (50 mg total) by mouth every 6 (six) hours as needed.   warfarin 2 MG tablet Commonly known as: COUMADIN Take by mouth.       Procedures/Studies: DG Lumbar Spine 2-3 Views  Result Date: 07/17/2020 CLINICAL DATA:  Back pain EXAM: LUMBAR SPINE - 2-3 VIEW COMPARISON:  None. FINDINGS: Normal alignment. No fracture. Degenerative spurring in the mid and lower lumbar spine. Mild degenerative facet disease in the lower lumbar spine. No fracture cleat that SI joints symmetric and unremarkable. Gastrojejunostomy tube in place. The tip appears to be in the proximal duodenum. IMPRESSION: No acute bony abnormality. Electronically Signed   By: Charlett NoseKevin  Dover M.D.   On: 07/17/2020 19:13   CT ABDOMEN PELVIS W CONTRAST  Result Date: 07/23/2020 CLINICAL DATA:  Bowel obstruction suspected. Nausea and vomiting with abdominal distension EXAM: CT ABDOMEN AND PELVIS WITH CONTRAST TECHNIQUE: Multidetector CT imaging of the abdomen and pelvis was performed using the standard protocol following bolus administration of intravenous contrast. CONTRAST:  100mL OMNIPAQUE IOHEXOL 300 MG/ML  SOLN COMPARISON:  None. FINDINGS: Lower chest: There are  small bilateral pleural effusions, right greater than left.There is cardiomegaly. Hepatobiliary: There are hypoattenuating nodules throughout the liver, statistically most likely to represent benign cysts. Normal gallbladder.There is no biliary ductal dilation. Pancreas: Normal contours without ductal dilatation. No peripancreatic fluid collection. Spleen: Unremarkable. Adrenals/Urinary Tract: --Adrenal glands: Unremarkable. --Right kidney/ureter: No hydronephrosis or radiopaque kidney stones. --Left kidney/ureter: No hydronephrosis or radiopaque kidney stones. --Urinary bladder: There is gas within the urinary bladder. Stomach/Bowel: --Stomach/Duodenum: There is a gastrojejunostomy tube in place that is well position with the tip of the tube terminating in the proximal jejunum. --Small bowel: Unremarkable. --Colon: Unremarkable. --  Appendix: Normal. Vascular/Lymphatic: Atherosclerotic calcification is present within the non-aneurysmal abdominal aorta, without hemodynamically significant stenosis. --No retroperitoneal lymphadenopathy. --No mesenteric lymphadenopathy. --No pelvic or inguinal lymphadenopathy. Reproductive: Status post hysterectomy. No adnexal mass. Other: There is a small amount of free fluid in the patient's abdomen and pelvis. There is a fat containing umbilical hernia. Musculoskeletal. No acute displaced fractures. IMPRESSION: 1. No acute abdominopelvic abnormality. 2. Small bilateral pleural effusions, right greater than left. 3. Cardiomegaly. 4. Gastrojejunostomy tube in place that is well position with the tip of the tube terminating in the proximal jejunum. 5. Small amount of free fluid in the patient's abdomen and pelvis. Aortic Atherosclerosis (ICD10-I70.0). Electronically Signed   By: Katherine Mantle M.D.   On: 07/23/2020 23:14   IR GJ Tube Change  Result Date: 07/22/2020 INDICATION: Malpositioned and poorly functioning gastrojejunal feeding tube with nausea and vomiting. EXAM:  EXCHANGE OF GASTROJEJUNAL FEEDING TUBE UNDER FLUOROSCOPY MEDICATIONS: None ANESTHESIA/SEDATION: None CONTRAST:  10 mL Omnipaque 300 FLUOROSCOPY TIME:  Fluoroscopy Time: 2 minutes and 18 seconds. COMPLICATIONS: None immediate. PROCEDURE: Informed written consent was obtained from the patient after a thorough discussion of the procedural risks, benefits and alternatives. All questions were addressed. Maximal Sterile Barrier Technique was utilized including caps, mask, sterile gowns, sterile gloves, sterile drape, hand hygiene and skin antiseptic. A timeout was performed prior to the initiation of the procedure. The pre-existing 34 French gastrojejunal feeding tube was imaged under fluoroscopy and injected with contrast. The retention balloon was deflated, the tube was retracted then removed over a guidewire. A 5 French catheter was advanced over the guidewire and used to further gain access through the duodenum and into the proximal jejunum. Over the wire, a new 20 French balloon retention single-lumen jejunostomy catheter was advanced. Catheter position was confirmed by fluoroscopy after contrast injection. The catheter was flushed. The retention balloon within the stomach was inflated with 9 mL of saline. FINDINGS: Initial fluoroscopy and contrast injection demonstrates that the pre-existing gastrojejunal tube has recoiled mostly into the stomach with the catheter tip located in the region of the duodenal bulb. The tube remains patent. After exchange, a single lumen, slightly larger jejunostomy catheter was able to be advanced through the stomach, duodenum and well into the proximal jejunum. This tube may be used immediately for feeding and medication. IMPRESSION: Exchange of retracted 26 French gastrojejunal feeding tube for new 20 French single-lumen balloon retention jejunostomy catheter advanced through the stomach and duodenum into the proximal jejunum. Electronically Signed   By: Irish Lack M.D.   On:  07/22/2020 14:09   US Venous Img Upper Uni Left (DVT)  Result Date: 07/18/2020 CLINICAL DATA:  Left upper extremity pain and edema. Patient is currently on anticoagulation. Evaluate for DVT. EXAM: LEFT UPPER EXTREMITY VENOUS DOPPLER ULTRASOUND TECHNIQUE: Gray-scale sonography with graded compression, as well as color Doppler and duplex ultrasound were performed to evaluate the upper extremity deep venous system from the level of the subclavian vein and including the jugular, axillary, basilic, radial, ulnar and upper cephalic vein. Spectral Doppler was utilized to evaluate flow at rest and with distal augmentation maneuvers. COMPARISON:  None. FINDINGS: Contralateral Subclavian Vein: Respiratory phasicity is normal and symmetric with the symptomatic side. No evidence of thrombus. Normal compressibility. Internal Jugular Vein: No evidence of thrombus. Normal compressibility, respiratory phasicity and response to augmentation. Subclavian Vein: No evidence of thrombus. Normal compressibility, respiratory phasicity and response to augmentation. Axillary Vein: No evidence of thrombus. Normal compressibility, respiratory phasicity and response to augmentation. Cephalic  Vein: No evidence of thrombus. Normal compressibility, respiratory phasicity and response to augmentation. Basilic Vein: No evidence of thrombus. Normal compressibility, respiratory phasicity and response to augmentation. Brachial Veins: No evidence of thrombus. Normal compressibility, respiratory phasicity and response to augmentation. Radial Veins: No evidence of thrombus. Normal compressibility, respiratory phasicity and response to augmentation. Ulnar Veins: No evidence of thrombus. Normal compressibility, respiratory phasicity and response to augmentation. Venous Reflux:  None visualized. Other Findings:  None visualized. IMPRESSION: No evidence of DVT within the left upper extremity. Electronically Signed   By: Simonne Come M.D.   On: 07/18/2020  10:57   DG Abd 2 Views  Result Date: 07/18/2020 CLINICAL DATA:  64 year old female with weakness nausea and vomiting. EXAM: ABDOMEN - 2 VIEW COMPARISON:  Lumbar radiographs yesterday. FINDINGS: Portable upright and supine views of the abdomen and pelvis. Percutaneous enteric tube redemonstrated in the midline. A single surgical clip projects in the right lower abdomen. No pneumoperitoneum. Non obstructed bowel gas pattern. Abdominal and pelvic visceral contours are within normal limits; the urinary bladder might be distended. Cardiomegaly. Prior cardiac valve replacement. Negative lung bases. No acute osseous abnormality identified. IMPRESSION: 1. Non obstructed bowel gas pattern, no free air. 2. Percutaneous enteric tube. 3. Cardiomegaly. Previous cardiac valve replacement. Electronically Signed   By: Odessa Fleming M.D.   On: 07/18/2020 12:34     Subjective: Pt reports that she wants to go home today.  She is refusing to have a new IV placed.  She refuses SNF placement.  No complaints reported.    Discharge Exam: Vitals:   07/23/20 2229 07/24/20 0452  BP: 132/60 (!) 123/48  Pulse: 95 95  Resp:    Temp: 99.2 F (37.3 C) 98.6 F (37 C)  SpO2: 100% 100%   Vitals:   07/23/20 1326 07/23/20 2229 07/24/20 0452 07/24/20 0500  BP: 127/74 132/60 (!) 123/48   Pulse: 86 95 95   Resp: 18     Temp: 98.4 F (36.9 C) 99.2 F (37.3 C) 98.6 F (37 C)   TempSrc: Oral Oral Oral   SpO2: 100% 100% 100%   Weight:    73.6 kg  Height:       General: emaciated, chronically ill appearing female, Pt is alert, awake, not in acute distress Cardiovascular: normal S1/S2 +, no rubs, no gallops Respiratory: CTA bilaterally, no wheezing, no rhonchi Abdominal: Soft, NT, ND, bowel sounds + Extremities: no edema, no cyanosis   The results of significant diagnostics from this hospitalization (including imaging, microbiology, ancillary and laboratory) are listed below for reference.     Microbiology: Recent Results  (from the past 240 hour(s))  Respiratory Panel by RT PCR (Flu A&B, Covid) - Urine, Clean Catch     Status: None   Collection Time: 07/15/20 11:18 PM   Specimen: Urine, Clean Catch; Nasopharyngeal  Result Value Ref Range Status   SARS Coronavirus 2 by RT PCR NEGATIVE NEGATIVE Final    Comment: (NOTE) SARS-CoV-2 target nucleic acids are NOT DETECTED.  The SARS-CoV-2 RNA is generally detectable in upper respiratoy specimens during the acute phase of infection. The lowest concentration of SARS-CoV-2 viral copies this assay can detect is 131 copies/mL. A negative result does not preclude SARS-Cov-2 infection and should not be used as the sole basis for treatment or other patient management decisions. A negative result may occur with  improper specimen collection/handling, submission of specimen other than nasopharyngeal swab, presence of viral mutation(s) within the areas targeted by this assay, and inadequate number  of viral copies (<131 copies/mL). A negative result must be combined with clinical observations, patient history, and epidemiological information. The expected result is Negative.  Fact Sheet for Patients:  https://www.moore.com/  Fact Sheet for Healthcare Providers:  https://www.young.biz/  This test is no t yet approved or cleared by the [place] FDA and  has been authorized for detection and/or diagnosis of SARS-CoV-2 by FDA under an Emergency Use Authorization (EUA). This EUA will remain  in effect (meaning this test can be used) for the duration of the COVID-19 declaration under Section 564(b)(1) of the Act, 21 U.S.C. section 360bbb-3(b)(1), unless the authorization is terminated or revoked sooner.     Influenza A by PCR NEGATIVE NEGATIVE Final   Influenza B by PCR NEGATIVE NEGATIVE Final    Comment: (NOTE) The Xpert Xpress SARS-CoV-2/FLU/RSV assay is intended as an aid in  the diagnosis of influenza from Nasopharyngeal  swab specimens and  should not be used as a sole basis for treatment. Nasal washings and  aspirates are unacceptable for Xpert Xpress SARS-CoV-2/FLU/RSV  testing.  Fact Sheet for Patients: https://www.moore.com/  Fact Sheet for Healthcare Providers: https://www.young.biz/  This test is not yet approved or cleared by the [place] FDA and  has been authorized for detection and/or diagnosis of SARS-CoV-2 by  FDA under an Emergency Use Authorization (EUA). This EUA will remain  in effect (meaning this test can be used) for the duration of the  Covid-19 declaration under Section 564(b)(1) of the Act, 21  U.S.C. section 360bbb-3(b)(1), unless the authorization is  terminated or revoked. Performed at [facility], [address] [place], [place] [address]   Urine culture     Status: Abnormal   Collection Time: 07/15/20 11:22 PM   Specimen: Urine, Clean Catch  Result Value Ref Range Status   Specimen Description   Final    URINE, CLEAN CATCH Performed at [facility], [address], [place], [place] [address]    Special Requests   Final    NONE Performed at [facility], [address], [place], [place] [address]    Culture >=100,000 COLONIES/mL ENTEROCOCCUS FAECALIS (A)  Final   Report Status 07/18/2020 FINAL  Final   Organism ID, Bacteria ENTEROCOCCUS FAECALIS (A)  Final      Susceptibility   Enterococcus faecalis - MIC*    AMPICILLIN <=2 SENSITIVE Sensitive     NITROFURANTOIN <=16 SENSITIVE Sensitive     VANCOMYCIN 1 SENSITIVE Sensitive     * >=100,000 COLONIES/mL ENTEROCOCCUS FAECALIS     Labs: BNP (last 3 results) No results for input(s): BNP in the last 8760 hours. Basic Metabolic Panel: Recent Labs  Lab 07/18/20 0756 07/18/20 2151 07/20/20 0647 07/21/20 0543 07/21/20 0544 07/22/20 0650 07/23/20 0603 07/24/20 0530  NA 140  139   < > 136 135  --  134* 136 130*  K 2.5*  2.4*   < > 2.9* 3.4*  --  3.7 4.1 3.6  CL 108   108   < > 103 106  --  108 109 105  CO2 20*  21*   < > 21* 18*  --  17* 14* 16*  GLUCOSE 72  75   < > 86 85  --  71 63* 88  BUN 14  14   < > 9 7*  --  6* 10 10  CREATININE 0.67  0.74   < > 0.60 0.66  --  0.68 0.86 0.94  CALCIUM 9.3  9.1   < >  7.8* 7.3*  --  7.3* 7.7* 7.7*  MG 2.2  --  1.2* 1.6*  --  2.2  --  1.8  PHOS 1.8*  1.7*   < > 2.1*  --  2.1* 1.7* 1.7* 1.2*   < > = values in this interval not displayed.   Liver Function Tests: Recent Labs  Lab 07/18/20 0756 07/19/20 0706 07/20/20 0647 07/23/20 1415 07/24/20 0530  AST  --   --  38 51* 57*  ALT  --   --  18 24 29   ALKPHOS  --   --  86 93 97  BILITOT  --   --  0.9 1.1 0.7  PROT  --   --  6.4* 6.6 6.7  ALBUMIN 2.5* 2.6* 2.8* 2.7* 2.8*   No results for input(s): LIPASE, AMYLASE in the last 168 hours. No results for input(s): AMMONIA in the last 168 hours. CBC: Recent Labs  Lab 07/18/20 2151 07/22/20 0650 07/24/20 0530  WBC 7.2 7.6 8.5  HGB 8.6* 9.1* 9.0*  HCT 27.7* 30.4* 30.4*  MCV 86.3 85.9 87.4  PLT 242 316 333   Cardiac Enzymes: No results for input(s): CKTOTAL, CKMB, CKMBINDEX, TROPONINI in the last 168 hours. BNP: Invalid input(s): POCBNP CBG: Recent Labs  Lab 07/23/20 2235 07/24/20 0114 07/24/20 0422 07/24/20 0726 07/24/20 1113  GLUCAP 154* 105* 88 86 106*   D-Dimer No results for input(s): DDIMER in the last 72 hours. Hgb A1c No results for input(s): HGBA1C in the last 72 hours. Lipid Profile No results for input(s): CHOL, HDL, LDLCALC, TRIG, CHOLHDL, LDLDIRECT in the last 72 hours. Thyroid function studies No results for input(s): TSH, T4TOTAL, T3FREE, THYROIDAB in the last 72 hours.  Invalid input(s): FREET3 Anemia work up Recent Labs    07/22/20 0650 07/22/20 0651  VITAMINB12 1,825*  --   FOLATE  --  19.5  FERRITIN 564*  --   TIBC 216*  --   IRON 19*  --   RETICCTPCT  --  0.8   Urinalysis    Component Value Date/Time   COLORURINE YELLOW 07/15/2020 2132   APPEARANCEUR  HAZY (A) 07/15/2020 2132   LABSPEC 1.020 07/15/2020 2132   PHURINE 6.0 07/15/2020 2132   GLUCOSEU NEGATIVE 07/15/2020 2132   HGBUR SMALL (A) 07/15/2020 2132   BILIRUBINUR NEGATIVE 07/15/2020 2132   KETONESUR 80 (A) 07/15/2020 2132   PROTEINUR 30 (A) 07/15/2020 2132   NITRITE NEGATIVE 07/15/2020 2132   LEUKOCYTESUR MODERATE (A) 07/15/2020 2132   Sepsis Labs Invalid input(s): PROCALCITONIN,  WBC,  LACTICIDVEN Microbiology Recent Results (from the past 240 hour(s))  Respiratory Panel by RT PCR (Flu A&B, Covid) - Urine, Clean Catch     Status: None   Collection Time: 07/15/20 11:18 PM   Specimen: Urine, Clean Catch; Nasopharyngeal  Result Value Ref Range Status   SARS Coronavirus 2 by RT PCR NEGATIVE NEGATIVE Final    Comment: (NOTE) SARS-CoV-2 target nucleic acids are NOT DETECTED.  The SARS-CoV-2 RNA is generally detectable in upper respiratoy specimens during the acute phase of infection. The lowest concentration of SARS-CoV-2 viral copies this assay can detect is 131 copies/mL. A negative result does not preclude SARS-Cov-2 infection and should not be used as the sole basis for treatment or other patient management decisions. A negative result may occur with  improper specimen collection/handling, submission of specimen other than nasopharyngeal swab, presence of viral mutation(s) within the areas targeted by this assay, and inadequate number of viral copies (<131 copies/mL). A negative  result must be combined with clinical observations, patient history, and epidemiological information. The expected result is Negative.  Fact Sheet for Patients:  https://www.moore.com/  Fact Sheet for Healthcare Providers:  https://www.young.biz/  This test is no t yet approved or cleared by the [place] FDA and  has been authorized for detection and/or diagnosis of SARS-CoV-2 by FDA under an Emergency Use Authorization (EUA). This EUA will remain   in effect (meaning this test can be used) for the duration of the COVID-19 declaration under Section 564(b)(1) of the Act, 21 U.S.C. section 360bbb-3(b)(1), unless the authorization is terminated or revoked sooner.     Influenza A by PCR NEGATIVE NEGATIVE Final   Influenza B by PCR NEGATIVE NEGATIVE Final    Comment: (NOTE) The Xpert Xpress SARS-CoV-2/FLU/RSV assay is intended as an aid in  the diagnosis of influenza from Nasopharyngeal swab specimens and  should not be used as a sole basis for treatment. Nasal washings and  aspirates are unacceptable for Xpert Xpress SARS-CoV-2/FLU/RSV  testing.  Fact Sheet for Patients: https://www.moore.com/  Fact Sheet for Healthcare Providers: https://www.young.biz/  This test is not yet approved or cleared by the [place] FDA and  has been authorized for detection and/or diagnosis of SARS-CoV-2 by  FDA under an Emergency Use Authorization (EUA). This EUA will remain  in effect (meaning this test can be used) for the duration of the  Covid-19 declaration under Section 564(b)(1) of the Act, 21  U.S.C. section 360bbb-3(b)(1), unless the authorization is  terminated or revoked. Performed at [facility], [address], [place], [place] [address]   Urine culture     Status: Abnormal   Collection Time: 07/15/20 11:22 PM   Specimen: Urine, Clean Catch  Result Value Ref Range Status   Specimen Description   Final    URINE, CLEAN CATCH Performed at [facility], [address] [place], [place] [address]    Special Requests   Final    NONE Performed at [facility], [address], [place], [place] [address]    Culture >=100,000 COLONIES/mL ENTEROCOCCUS FAECALIS (A)  Final   Report Status 07/18/2020 FINAL  Final   Organism ID, Bacteria ENTEROCOCCUS FAECALIS (A)  Final      Susceptibility   Enterococcus faecalis - MIC*    AMPICILLIN <=2 SENSITIVE Sensitive     NITROFURANTOIN <=16 SENSITIVE  Sensitive     VANCOMYCIN 1 SENSITIVE Sensitive     * >=100,000 COLONIES/mL ENTEROCOCCUS FAECALIS   Time coordinating discharge: 50 mins  SIGNED:  Standley Dakins, MD  [facility] 07/24/2020, 1:25 PM How to contact the [facility] Attending or Consulting provider 7A - 7P or covering provider during after hours 7P -7A, for this patient?  1. Check the care team in [facility] and look for a) attending/consulting TRH provider listed and b) the [facility] team listed 2. Log into [email or web address] and use [place]'s universal password to access. If you do not have the password, please contact the hospital operator. 3. Locate the [facility] provider you are looking for under [facility] and page to a number that you can be directly reached. 4. If you still have difficulty reaching the provider, please page the [facility] (Director on Call) for the Hospitalists listed on amion for assistance.

## 2020-07-24 NOTE — Progress Notes (Signed)
ANTICOAGULATION CONSULT NOTE -   Pharmacy Consult for Coumadin Indication: MVR and Afib  No Known Allergies  Patient Measurements: Height: 5' (152.4 cm) Weight: 73.6 kg (162 lb 4.1 oz) IBW/kg (Calculated) : 45.5  Vital Signs: Temp: 98.6 F (37 C) (11/10 0452) Temp Source: Oral (11/10 0452) BP: 123/48 (11/10 0452) Pulse Rate: 95 (11/10 0452)  Labs: Recent Labs    07/22/20 0650 07/23/20 0603 07/24/20 0530  HGB 9.1*  --  9.0*  HCT 30.4*  --  30.4*  PLT 316  --  333  LABPROT 32.5* 28.6* 28.9*  INR 3.3* 2.8* 2.8*  CREATININE 0.68 0.86 0.94    Estimated Creatinine Clearance: 52.7 mL/min (by C-G formula based on SCr of 0.94 mg/dL).   Assessment: 64yo female c/o weakness and nausea x1wk, admitted for hypercalcemia, to continue Coumadin for MVR/Afib.   Home dose: warfarin 2mg  daily Drug Interactions (major): amiodarone 200mg  daily   INR:  2.8->in desired goal range 11/8 and 11/9: Patient refusing to take warfarin  hgb 9.0   Goal of Therapy:  INR 2.5-3.5   Plan:  Warfarin 2 mg x 1 dose.  Daily INR and every other day CBC Monitor for S/S of bleeding  13/8, PharmD Clinical Pharmacist 07/24/2020 10:35 AM

## 2020-07-24 NOTE — Progress Notes (Signed)
Patient's IV not working, will not flush. Pt refuses a new IV. Educated pt that she is receiving IV potassium phosphate; however, pt continues to refuse stating "I am going home today." Dr. Laural Benes notified and will change potassium phosphate route.  Manya Silvas, RN

## 2020-07-24 NOTE — Plan of Care (Signed)

## 2020-07-24 NOTE — TOC Transition Note (Signed)
Transition of Care [facility]) - CM/SW Discharge Note   Patient Details  Name: Annette Rogers MRN: [id number] Date of Birth: 1956/08/30  Transition of Care [facility]) CM/SW Contact:  Annice Needy, LCSW Phone Number: 07/24/2020, 1:36 PM   Clinical Narrative:    Spoke with daughter, [name], regarding discharge. Patient is active with [facility]. Notified Cory with [facility] of discharge and HH orders. Family desires EMS transport home.      Barriers to Discharge: Continued Medical Work up   Patient Goals and CMS Choice Patient states their goals for this hospitalization and ongoing recovery are:: return home   Choice offered to / list presented to : Patient, Adult Children  Discharge Placement                       Discharge Plan and Services In-house Referral: Clinical Social Work   Post Acute Care Choice: Home Health                               Social Determinants of Health (SDOH) Interventions     Readmission Risk Interventions No flowsheet data found.

## 2020-07-29 DIAGNOSIS — Z79899 Other long term (current) drug therapy: Secondary | ICD-10-CM | POA: Insufficient documentation

## 2020-08-14 ENCOUNTER — Telehealth: Payer: Self-pay

## 2020-08-14 NOTE — Telephone Encounter (Signed)
Attempted to contact patient and patient's granddaughter to reschedule appointment from 12/9 to 12/14 no answer or voicemail.

## 2020-08-14 NOTE — Telephone Encounter (Signed)
Sherlie Ban (staff at [facility]) called to say patient in office today and needs to be seen by palliative. Advised Amy that we were able to talk with patient yesterday, but have not been able to reach patient's daughter as requested  By patient.  Amy wanted [place] to give appt and she will relay to patient. Appt. Scheduled for 12/9 at 11 am with NP Katrinka Blazing, Palliative referral and scheduling notified

## 2020-08-15 ENCOUNTER — Telehealth: Payer: Self-pay | Admitting: Primary Care

## 2020-08-15 NOTE — Telephone Encounter (Signed)
I also called patient's son Adah Salvage nd spoke with him to let him know that we have scheduled an appointment with the patient for 08/22/20 and we needed to reschedule this to 08/27/20 @ 10:30 AM.  He wrote this information down and will give this to the patient and I also gave him our contact number for patient to call us patient if this doesn't work for them.

## 2020-08-15 NOTE — Telephone Encounter (Signed)
Called patient's granddaughter [name] to reschedule the Palliative Consult for 08/22/20, no answer and unable to leave message due to mailboxwas full.  I then tried to call patient with no answer and no voicemail set up.

## 2020-08-22 ENCOUNTER — Other Ambulatory Visit: Payer: Self-pay | Admitting: Primary Care

## 2020-08-22 DIAGNOSIS — I4430 Unspecified atrioventricular block: Secondary | ICD-10-CM | POA: Insufficient documentation

## 2020-08-22 DIAGNOSIS — I9789 Other postprocedural complications and disorders of the circulatory system, not elsewhere classified: Secondary | ICD-10-CM | POA: Insufficient documentation

## 2020-08-27 ENCOUNTER — Other Ambulatory Visit: Payer: Medicare Other | Admitting: Primary Care

## 2020-08-27 ENCOUNTER — Other Ambulatory Visit: Payer: Self-pay

## 2020-08-27 ENCOUNTER — Encounter: Payer: Self-pay | Admitting: Primary Care

## 2020-08-27 DIAGNOSIS — G8194 Hemiplegia, unspecified affecting left nondominant side: Secondary | ICD-10-CM

## 2020-08-27 DIAGNOSIS — G8929 Other chronic pain: Secondary | ICD-10-CM | POA: Insufficient documentation

## 2020-08-27 DIAGNOSIS — E871 Hypo-osmolality and hyponatremia: Secondary | ICD-10-CM

## 2020-08-27 DIAGNOSIS — Z515 Encounter for palliative care: Secondary | ICD-10-CM

## 2020-08-27 DIAGNOSIS — E213 Hyperparathyroidism, unspecified: Secondary | ICD-10-CM

## 2020-08-27 DIAGNOSIS — M25569 Pain in unspecified knee: Secondary | ICD-10-CM | POA: Insufficient documentation

## 2020-08-27 DIAGNOSIS — J449 Chronic obstructive pulmonary disease, unspecified: Secondary | ICD-10-CM

## 2020-08-27 DIAGNOSIS — I63511 Cerebral infarction due to unspecified occlusion or stenosis of right middle cerebral artery: Secondary | ICD-10-CM

## 2020-08-27 NOTE — Progress Notes (Signed)
[facility] Consult Note Telephone: [phone]  Fax: [phone]   Date of encounter: 08/27/20 PATIENT NAME: Trinka Keshishyan [address] [place] [address] [phone] (home)  DOB: April 18, 1956 MRN: [id number]  PRIMARY CARE PROVIDER:    Ludwig Clarks, [place],  [place] [phone]  REFERRING PROVIDER:   Ludwig Clarks, [place],  [place] [address] [phone]  RESPONSIBLE PARTY:   Extended Emergency Contact Information Primary Emergency Contact: Gildardo Cranker Home Phone: [phone] Mobile Phone: [phone] Relation: Sister Secondary Emergency Contact: Westly Pam Mobile Phone: [phone] Relation: Daughter  I met face to face with patient and family in  home. Palliative Care was asked to follow this patient by consultation request of Ludwig Clarks, FNP to help address advance care planning and goals of care. This is the initial  visit.   ASSESSMENT AND RECOMMENDATIONS:   1. Advance Care Planning/Goals of Care: Goals include to maximize quality of life and symptom management. Patient lives in own home with family caregivers. No POA identified. Will have ongoing discussions re goals of care and complex medical decision making.  T/c to [facility] who discussed pt's care, meds and insurance. She recently went to dss to discuss caregivers and medicaid needs. This meeting resulted in her being told pt is over income.  2. Symptom Management:   Home health/rehab: Has not received services in 4-5 weeks post hospital d/c 07/24/20. [facility] is not taking the referral. Needs referral to a home health agency with PT, OT, ST and CNA services. ASAP.  Medication management; Missing many meds, including Sensipar and pain meds. Poor historians in the home, recommend pre- packaging of medications. Has had narcotics ordered in the past, not current. Education provided RE adding atc acetaminophen  CR.   T/c to [employer] to discuss pre packaging. Discussed with pharmacy, [facility] - they will pay for meds for patient. Patient has been without meds for over a month. Has no Medicare D plan. Pharmacy is applying to [place] [place]. Recommend pre packaging which family will need to take meds back for this. Updated med list is needed to pharmacy.  Immobility: Has tilt chair and hoyer lift. Family states she had her lift sling taken by EMS. She cannot be taken out of bed due to this loss. She needs this replaced, call to EMS to request, left VM message. No model number visible on lift.  3. Follow up Palliative Care Visit: Palliative care will continue to follow for goals of care clarification and symptom management. Return 3-4 weeks or prn.  4. Family /Caregiver/Community Supports:  Lives in own home with family pitching in for assistance.  No home assistance at this time. Was told her income was too high for PCS. Referred to council on aging for resources.  5. Cognitive / Functional decline: A and O x 3, dependent in most adls and all iadls. L hemiparesis.  I spent 75 minutes providing this consultation,  from 1100 to 1215. More than 50% of the time in this consultation was spent coordinating communication.   CODE STATUS: FULL  PPS: 40%  HOSPICE ELIGIBILITY/DIAGNOSIS: no  Subjective:  CHIEF COMPLAINT: self care deficits  HISTORY OF PRESENT ILLNESS:  Anelly Samarin is a 64 y.o. year old female  with cva and left hemiparesis in 8/21, now with hypercalcemia and other electrolyte imbalances. Many self care deficits; No meds in over a month including her meds for hyperparathyroidism. Also endorse great  left sided pain s/p CvA, especially in the should, constant,  and 9/10. Refused gabapentin after several doses.  We are asked to consult around advance care planning and complex medical decision making.    Review and summarization of old 41 records shows or history from other than patient   shows history of hypercalcemia and recent hospital stay. Review or lab tests RE calcium, sodium Review of case with family member Jacquelyne Balint gave history. Decision for obtaining previous records received from PCP office.  History obtained from review of EMR, discussion with primary team, and  interview with family, caregiver  and/or Ms. Whiteaker. Records reviewed and summarized above.   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   On amiodarone therapy 07/29/2020   Malfunction of percutaneous endoscopic gastrostomy (PEG) tube ([place])    Goals of care, counseling/discussion    Palliative care by specialist    Hypomagnesemia 07/16/2020   Hypophosphatemia 07/16/2020   Hypercalcemia 07/15/2020   GERD (gastroesophageal reflux disease) 07/15/2020   Gastric ulcer 07/15/2020   Hypertension    Hypokalemia    Hyponatremia    Normocytic anemia    Hyperparathyroidism ([place]) 05/31/2020   Gastrojejunostomy tube status ([place]) 05/31/2020   Warfarin anticoagulation 05/15/2020   Thrombocytosis 05/06/2020   Left hemiplegia ([place]) 04/29/2020   S/P mitral valve replacement with metallic valve 69/62/9528   On enteral nutrition 04/29/2020   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 ([place]) 04/27/2020   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   COPD (chronic obstructive pulmonary disease) ([place]) 04/23/2020   Paroxysmal A-fib ([place]) 04/23/2020   Pulmonary hypertension ([place]) 04/23/2020   Hypercholesterolemia 04/23/2020   Non-rheumatic mitral valve stenosis 12/09/2017   PAST MEDICAL HISTORY:  Active Ambulatory Problems    Diagnosis Date Noted   Hypercalcemia 07/15/2020   Hypertension    COPD (chronic obstructive pulmonary disease) ([place]) 04/23/2020   GERD (gastroesophageal reflux disease) 07/15/2020   Hyperparathyroidism ([place]) 05/31/2020   Left  hemiplegia ([place]) 04/29/2020   Non-rheumatic mitral valve stenosis 12/09/2017   Paroxysmal A-fib ([place]) 04/23/2020   Pulmonary hypertension ([place]) 04/23/2020   S/P mitral valve replacement with metallic valve 41/32/4401   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   Warfarin anticoagulation 05/15/2020   Gastric ulcer 07/15/2020   Gastrojejunostomy tube status ([place]) 05/31/2020   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 ([place]) 04/27/2020   Thrombocytosis 05/06/2020   Hypokalemia    Hyponatremia    Normocytic anemia    Hypomagnesemia 07/16/2020   Hypophosphatemia 07/16/2020   Malfunction of percutaneous endoscopic gastrostomy (PEG) tube ([place])    Goals of care, counseling/discussion    Palliative care by specialist    AV block, 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   Chronic knee pain 08/27/2020   Hypercholesterolemia 04/23/2020   On amiodarone therapy 07/29/2020   On enteral nutrition 04/29/2020   Resolved Ambulatory Problems    Diagnosis Date Noted   No Resolved Ambulatory Problems   Past Medical History:  Diagnosis Date   High cholesterol    SOCIAL HX:  Social History   Tobacco Use   Smoking status: Never Smoker   Smokeless tobacco: Never Used  Substance Use Topics   Alcohol use: Never   FAMILY HX: Family History  Problem Relation Age of Onset   Hypertension Mother    Stroke Father    Hypertension Father    Cancer - Colon Brother  AAA (abdominal aortic aneurysm) Brother    Heart disease Daughter      ALLERGIES:  Allergies  Allergen Reactions   Oxycodone Nausea Only     PERTINENT MEDICATIONS:  Outpatient Encounter Medications as of 08/27/2020  Medication Sig   Cholecalciferol 50 MCG (2000 UT) TABS Take by mouth.   mirtazapine (REMERON) 30 MG tablet Take 30 mg by mouth at bedtime.   acetaminophen (TYLENOL) 325 MG tablet Take by mouth.   amiodarone (PACERONE) 200 MG tablet Take by mouth.   aspirin 81 MG EC tablet  Take by mouth.   atorvastatin (LIPITOR) 40 MG tablet Take by mouth.   carvedilol (COREG) 3.125 MG tablet Take 1 tablet (3.125 mg total) by mouth 2 (two) times daily with a meal.   cinacalcet (SENSIPAR) 90 MG tablet Take by mouth. (Patient not taking: Reported on 08/27/2020)   cyclobenzaprine (FLEXERIL) 5 MG tablet Take 1 tablet (5 mg total) by mouth 3 (three) times daily as needed. For muscle spasms.  May cause drowsiness   diclofenac Sodium (VOLTAREN) 1 % GEL Apply topically.   Multiple Vitamin (MULTIVITAMIN WITH MINERALS) TABS tablet Place 1 tablet into feeding tube daily. (Patient not taking: Reported on 08/27/2020)   Nutritional Supplements (FEEDING SUPPLEMENT, OSMOLITE 1.5 CAL,) LIQD Place 1,000 mLs into feeding tube continuous. (Patient not taking: Reported on 08/27/2020)   nystatin (MYCOSTATIN/NYSTOP) powder Apply topically.   pantoprazole (PROTONIX) 40 MG tablet Take by mouth.   potassium & sodium phosphates (PHOS-NAK) 280-160-250 MG PACK Take 1 packet by mouth 4 (four) times daily -  with meals and at bedtime.   senna-docusate (SENOKOT-S) 8.6-50 MG tablet Take 1 tablet by mouth 2 (two) times daily.   sodium bicarbonate 650 MG tablet Take 1 tablet (650 mg total) by mouth 2 (two) times daily.   traMADol (ULTRAM) 50 MG tablet Take 1 tablet (50 mg total) by mouth every 6 (six) hours as needed. (Patient not taking: Reported on 08/27/2020)   warfarin (COUMADIN) 2 MG tablet Take by mouth. (Patient not taking: Reported on 08/27/2020)   No facility-administered encounter medications on file as of 08/27/2020.     Objective: ROS General: NAD EYES: nearsighted ENMT: denies dysphagia Cardiovascular: denies chest pain Pulmonary: denies cough, denies increased SOB Abdomen: endorses fair appetite, endorses constipation, endorses incontinence of bowel GU: denies dysuria, endorses incontinence of urine MSK:  endorses ROM limitations, no falls reported Skin: endorses intertriginous  rashes Neurological: endorses weakness, endorses pain, endorses occ  insomnia Psych: Endorses positive mood  Physical Exam: Current and past weights: 162 lbs Constitutional: 97/60 HR=83 RR =18 General: frail appearing, WNWD EYES: anicteric sclera,lids intact, no discharge  ENMT: intact hearing,oral mucous membranes moist, edentulous CV: S1S2, RRR, no LE edema  Pulmonary: LCTA, no increased work of breathing, no cough, no audible wheezes, room air Abdomen: intake 75%, no ascites, + BS, soft non tender. MSK: mild sarcopenia, decreased ROM in all extremities,  non ambulatory, Left hemiparesis Skin: warm and dry Neuro: Generalized weakness,no  cognitive impairment,  Psych: non-anxious affect, A and O x 2-3 Hem/lymph/immuno: no widespread bruising   Thank you for the opportunity to participate in the care of Ms. Brassell.  The palliative care team will continue to follow. Please call our office at [phone] if we can be of additional assistance.  Jason Coop, NP , DNP, MPH, AGPCNP-BC, ACHPN  COVID-19 PATIENT SCREENING TOOL  Person answering questions: ___________[place]______ _____   1.  Is the patient or any family member in the home showing  any signs or symptoms regarding respiratory infection?               Person with Symptom- __________NA_________________  a. Fever                                                                          Yes___ No___          ___________________  b. Shortness of breath                                                    Yes___ No___          ___________________ c. Cough/congestion                                       Yes___  No___         ___________________ d. Body aches/pains                                                         Yes___ No___        ____________________ e. Gastrointestinal symptoms (diarrhea, nausea)           Yes___ No___        ____________________  2. Within the past 14 days, has anyone living in the home had  any contact with someone with or under investigation for COVID-19?    Yes___ No_X_   Person __________________

## 2020-09-16 ENCOUNTER — Telehealth: Payer: Self-pay

## 2020-09-16 NOTE — Telephone Encounter (Signed)
Phone call to PCP Marta Antu office regarding [place] from [place], [name] reporting that feeding tube was clogged and patient has been waiting on home health. MD office reports that they have made home health referrals, but no one could take her because of staffing issues.  They also stated they were told patient not using feeding tube and has GI appt in a little over a week. Report to Palliative RN Navigator to call daughter back and try to get contact for caregiver with patient to see the plan and the current issue.

## 2020-09-18 ENCOUNTER — Telehealth: Payer: Self-pay | Admitting: Primary Care

## 2020-09-18 NOTE — Telephone Encounter (Signed)
T/[facility] from [facility] who has PT and RN going to see pt.  PT inquired about lost hoyer sling and pharmacy prep med packs. I encourage med pack use, and don't have any more info on hoyer. Perhaps PT can identify type of sling and help assist to reorder.

## 2020-09-19 ENCOUNTER — Encounter (HOSPITAL_COMMUNITY): Payer: Self-pay | Admitting: Emergency Medicine

## 2020-09-19 ENCOUNTER — Emergency Department (HOSPITAL_COMMUNITY): Payer: Medicare Other

## 2020-09-19 ENCOUNTER — Other Ambulatory Visit: Payer: Self-pay

## 2020-09-19 ENCOUNTER — Inpatient Hospital Stay (HOSPITAL_COMMUNITY)
Admission: EM | Admit: 2020-09-19 | Discharge: 2020-09-23 | DRG: 683 | Disposition: A | Discharge status: Home Health | Payer: Medicare Other | Attending: Family Medicine | Admitting: Family Medicine

## 2020-09-19 DIAGNOSIS — Z90710 Acquired absence of both cervix and uterus: Secondary | ICD-10-CM

## 2020-09-19 DIAGNOSIS — Z7901 Long term (current) use of anticoagulants: Secondary | ICD-10-CM | POA: Diagnosis not present

## 2020-09-19 DIAGNOSIS — Z954 Presence of other heart-valve replacement: Secondary | ICD-10-CM | POA: Diagnosis not present

## 2020-09-19 DIAGNOSIS — I272 Pulmonary hypertension, unspecified: Secondary | ICD-10-CM | POA: Diagnosis present

## 2020-09-19 DIAGNOSIS — E876 Hypokalemia: Secondary | ICD-10-CM | POA: Diagnosis present

## 2020-09-19 DIAGNOSIS — K219 Gastro-esophageal reflux disease without esophagitis: Secondary | ICD-10-CM | POA: Diagnosis not present

## 2020-09-19 DIAGNOSIS — Z9889 Other specified postprocedural states: Secondary | ICD-10-CM | POA: Diagnosis not present

## 2020-09-19 DIAGNOSIS — J449 Chronic obstructive pulmonary disease, unspecified: Secondary | ICD-10-CM | POA: Diagnosis present

## 2020-09-19 DIAGNOSIS — Z951 Presence of aortocoronary bypass graft: Secondary | ICD-10-CM

## 2020-09-19 DIAGNOSIS — Z934 Other artificial openings of gastrointestinal tract status: Secondary | ICD-10-CM

## 2020-09-19 DIAGNOSIS — I480 Paroxysmal atrial fibrillation: Secondary | ICD-10-CM | POA: Diagnosis present

## 2020-09-19 DIAGNOSIS — Z79899 Other long term (current) drug therapy: Secondary | ICD-10-CM

## 2020-09-19 DIAGNOSIS — D638 Anemia in other chronic diseases classified elsewhere: Secondary | ICD-10-CM | POA: Diagnosis present

## 2020-09-19 DIAGNOSIS — Z7982 Long term (current) use of aspirin: Secondary | ICD-10-CM

## 2020-09-19 DIAGNOSIS — E860 Dehydration: Secondary | ICD-10-CM | POA: Diagnosis present

## 2020-09-19 DIAGNOSIS — Z8249 Family history of ischemic heart disease and other diseases of the circulatory system: Secondary | ICD-10-CM

## 2020-09-19 DIAGNOSIS — I959 Hypotension, unspecified: Secondary | ICD-10-CM | POA: Diagnosis present

## 2020-09-19 DIAGNOSIS — E878 Other disorders of electrolyte and fluid balance, not elsewhere classified: Secondary | ICD-10-CM | POA: Diagnosis present

## 2020-09-19 DIAGNOSIS — Z952 Presence of prosthetic heart valve: Secondary | ICD-10-CM | POA: Diagnosis not present

## 2020-09-19 DIAGNOSIS — E7800 Pure hypercholesterolemia, unspecified: Secondary | ICD-10-CM | POA: Diagnosis not present

## 2020-09-19 DIAGNOSIS — Z20822 Contact with and (suspected) exposure to covid-19: Secondary | ICD-10-CM | POA: Diagnosis present

## 2020-09-19 DIAGNOSIS — E785 Hyperlipidemia, unspecified: Secondary | ICD-10-CM | POA: Diagnosis present

## 2020-09-19 DIAGNOSIS — N179 Acute kidney failure, unspecified: Principal | ICD-10-CM | POA: Diagnosis present

## 2020-09-19 DIAGNOSIS — R197 Diarrhea, unspecified: Secondary | ICD-10-CM | POA: Diagnosis present

## 2020-09-19 DIAGNOSIS — I63511 Cerebral infarction due to unspecified occlusion or stenosis of right middle cerebral artery: Secondary | ICD-10-CM | POA: Diagnosis present

## 2020-09-19 DIAGNOSIS — K5900 Constipation, unspecified: Secondary | ICD-10-CM | POA: Diagnosis present

## 2020-09-19 DIAGNOSIS — I342 Nonrheumatic mitral (valve) stenosis: Secondary | ICD-10-CM | POA: Diagnosis present

## 2020-09-19 DIAGNOSIS — I69354 Hemiplegia and hemiparesis following cerebral infarction affecting left non-dominant side: Secondary | ICD-10-CM | POA: Diagnosis not present

## 2020-09-19 DIAGNOSIS — R112 Nausea with vomiting, unspecified: Secondary | ICD-10-CM | POA: Diagnosis present

## 2020-09-19 DIAGNOSIS — Z823 Family history of stroke: Secondary | ICD-10-CM

## 2020-09-19 DIAGNOSIS — Z885 Allergy status to narcotic agent status: Secondary | ICD-10-CM

## 2020-09-19 DIAGNOSIS — I10 Essential (primary) hypertension: Secondary | ICD-10-CM | POA: Diagnosis present

## 2020-09-19 DIAGNOSIS — D649 Anemia, unspecified: Secondary | ICD-10-CM | POA: Diagnosis present

## 2020-09-19 DIAGNOSIS — E213 Hyperparathyroidism, unspecified: Secondary | ICD-10-CM | POA: Diagnosis not present

## 2020-09-19 DIAGNOSIS — R519 Headache, unspecified: Secondary | ICD-10-CM

## 2020-09-19 DIAGNOSIS — I69322 Dysarthria following cerebral infarction: Secondary | ICD-10-CM

## 2020-09-19 HISTORY — DX: Cerebral infarction, unspecified: I63.9

## 2020-09-19 LAB — URINALYSIS, ROUTINE W REFLEX MICROSCOPIC
Bilirubin Urine: NEGATIVE
Glucose, UA: NEGATIVE mg/dL
Ketones, ur: 5 mg/dL — AB
Nitrite: NEGATIVE
Protein, ur: 100 mg/dL — AB
Specific Gravity, Urine: 1.016 (ref 1.005–1.030)
Trans Epithel, UA: 3
WBC, UA: >50 WBC/hpf — ABNORMAL HIGH (ref 0–5)
pH: 7 (ref 5.0–8.0)

## 2020-09-19 LAB — CBC WITH DIFFERENTIAL/PLATELET
Abs Immature Granulocytes: 0.04 10*3/uL (ref 0.00–0.07)
Basophils Absolute: 0 10*3/uL (ref 0.0–0.1)
Basophils Relative: 0 %
Eosinophils Absolute: 0 10*3/uL (ref 0.0–0.5)
Eosinophils Relative: 0 %
HCT: 29.9 % — ABNORMAL LOW (ref 36.0–46.0)
Hemoglobin: 9.4 g/dL — ABNORMAL LOW (ref 12.0–15.0)
Immature Granulocytes: 0 %
Lymphocytes Relative: 21 %
Lymphs Abs: 1.9 10*3/uL (ref 0.7–4.0)
MCH: 24.2 pg — ABNORMAL LOW (ref 26.0–34.0)
MCHC: 31.4 g/dL (ref 30.0–36.0)
MCV: 77.1 fL — ABNORMAL LOW (ref 80.0–100.0)
Monocytes Absolute: 0.6 10*3/uL (ref 0.1–1.0)
Monocytes Relative: 7 %
Neutro Abs: 6.4 10*3/uL (ref 1.7–7.7)
Neutrophils Relative %: 72 %
Platelets: 286 10*3/uL (ref 150–400)
RBC: 3.88 MIL/uL (ref 3.87–5.11)
RDW: 20.1 % — ABNORMAL HIGH (ref 11.5–15.5)
WBC: 9 10*3/uL (ref 4.0–10.5)
nRBC: 0.2 % (ref 0.0–0.2)

## 2020-09-19 LAB — BASIC METABOLIC PANEL
Anion gap: 16 — ABNORMAL HIGH (ref 5–15)
BUN: 40 mg/dL — ABNORMAL HIGH (ref 8–23)
CO2: 30 mmol/L (ref 22–32)
Calcium: 10 mg/dL (ref 8.9–10.3)
Chloride: 91 mmol/L — ABNORMAL LOW (ref 98–111)
Creatinine, Ser: 1.45 mg/dL — ABNORMAL HIGH (ref 0.44–1.00)
GFR, Estimated: 40 mL/min — ABNORMAL LOW (ref >60)
Glucose, Bld: 100 mg/dL — ABNORMAL HIGH (ref 70–99)
Potassium: 2.5 mmol/L — CL (ref 3.5–5.1)
Sodium: 137 mmol/L (ref 135–145)

## 2020-09-19 LAB — RESP PANEL BY RT-PCR (FLU A&B, COVID) ARPGX2
Influenza A by PCR: NEGATIVE
Influenza B by PCR: NEGATIVE
SARS Coronavirus 2 by RT PCR: NEGATIVE

## 2020-09-19 LAB — PROTIME-INR
INR: 2 — ABNORMAL HIGH (ref 0.8–1.2)
Prothrombin Time: 22 seconds — ABNORMAL HIGH (ref 11.4–15.2)

## 2020-09-19 LAB — MAGNESIUM: Magnesium: 1.9 mg/dL (ref 1.7–2.4)

## 2020-09-19 LAB — PHOSPHORUS: Phosphorus: 2.1 mg/dL — ABNORMAL LOW (ref 2.5–4.6)

## 2020-09-19 MED ORDER — WARFARIN - PHARMACIST DOSING INPATIENT: Freq: Every day | Status: DC

## 2020-09-19 MED ORDER — SODIUM CHLORIDE 0.9 % IV BOLUS
1000.0000 mL | Freq: Once | INTRAVENOUS | Status: AC
  Administered 2020-09-19: 1000 mL via INTRAVENOUS

## 2020-09-19 MED ORDER — BISACODYL 5 MG PO TBEC
5.0000 mg | DELAYED_RELEASE_TABLET | Freq: Every day | ORAL | Status: DC | PRN
Start: 2020-09-19 — End: 2020-09-19

## 2020-09-19 MED ORDER — ONDANSETRON HCL 4 MG/2ML IJ SOLN
4.0000 mg | Freq: Four times a day (QID) | INTRAMUSCULAR | Status: DC | PRN
  Administered 2020-09-20 – 2020-09-21 (×3): 4 mg via INTRAVENOUS
  Filled 2020-09-19 (×3): qty 2

## 2020-09-19 MED ORDER — ONDANSETRON HCL 4 MG PO TABS: 4.0000 mg | ORAL_TABLET | Freq: Four times a day (QID) | ORAL | Status: DC | PRN

## 2020-09-19 MED ORDER — POLYETHYLENE GLYCOL 3350 17 G PO PACK
17.0000 g | PACK | Freq: Every day | ORAL | Status: DC
  Administered 2020-09-19 – 2020-09-22 (×4): 17 g
  Filled 2020-09-19 (×4): qty 1

## 2020-09-19 MED ORDER — POTASSIUM CHLORIDE IN NACL 20-0.9 MEQ/L-% IV SOLN
INTRAVENOUS | Status: DC
  Filled 2020-09-19: qty 1000

## 2020-09-19 MED ORDER — LIDOCAINE HCL (PF) 1 % IJ SOLN
INTRAMUSCULAR | Status: AC
  Filled 2020-09-19: qty 30

## 2020-09-19 MED ORDER — CHLORHEXIDINE GLUCONATE CLOTH 2 % EX PADS
6.0000 | MEDICATED_PAD | Freq: Every day | CUTANEOUS | Status: DC
  Administered 2020-09-20 – 2020-09-23 (×4): 6 via TOPICAL

## 2020-09-19 MED ORDER — WARFARIN SODIUM 2 MG PO TABS
2.0000 mg | ORAL_TABLET | Freq: Once | ORAL | Status: AC
  Administered 2020-09-19: 2 mg
  Filled 2020-09-19: qty 1

## 2020-09-19 MED ORDER — ACETAMINOPHEN 650 MG RE SUPP
650.0000 mg | Freq: Four times a day (QID) | RECTAL | Status: DC | PRN
Start: 2020-09-19 — End: 2020-09-23

## 2020-09-19 MED ORDER — HYDROCODONE-ACETAMINOPHEN 5-325 MG PO TABS
1.0000 | ORAL_TABLET | ORAL | Status: DC | PRN
  Administered 2020-09-19: 1 via ORAL
  Filled 2020-09-19: qty 1

## 2020-09-19 MED ORDER — ACETAMINOPHEN 325 MG PO TABS: 650.0000 mg | ORAL_TABLET | Freq: Four times a day (QID) | ORAL | Status: DC | PRN

## 2020-09-19 MED ORDER — WARFARIN SODIUM 2 MG PO TABS
2.0000 mg | ORAL_TABLET | Freq: Once | ORAL | Status: DC
  Filled 2020-09-19: qty 1

## 2020-09-19 MED ORDER — POTASSIUM CHLORIDE 10 MEQ/100ML IV SOLN
10.0000 meq | INTRAVENOUS | Status: AC
  Administered 2020-09-19 (×4): 10 meq via INTRAVENOUS
  Filled 2020-09-19 (×4): qty 100

## 2020-09-19 MED ORDER — HYDROCODONE-ACETAMINOPHEN 5-325 MG PO TABS
1.0000 | ORAL_TABLET | ORAL | Status: DC | PRN
  Administered 2020-09-19: 1
  Administered 2020-09-21: 2
  Filled 2020-09-19: qty 2
  Filled 2020-09-19: qty 1

## 2020-09-19 MED ORDER — ACETAMINOPHEN 325 MG PO TABS
650.0000 mg | ORAL_TABLET | Freq: Four times a day (QID) | ORAL | Status: DC | PRN
  Administered 2020-09-20 – 2020-09-21 (×2): 650 mg
  Filled 2020-09-19 (×2): qty 2

## 2020-09-19 MED ORDER — ACETAMINOPHEN 650 MG RE SUPP: 650.0000 mg | Freq: Four times a day (QID) | RECTAL | Status: DC | PRN

## 2020-09-19 MED ORDER — FOSFOMYCIN TROMETHAMINE 3 G PO PACK
3.0000 g | PACK | Freq: Once | ORAL | Status: AC
  Administered 2020-09-20: 3 g via ORAL
  Filled 2020-09-19 (×2): qty 3

## 2020-09-19 MED ORDER — ONDANSETRON HCL 4 MG/2ML IJ SOLN
4.0000 mg | Freq: Four times a day (QID) | INTRAMUSCULAR | Status: DC | PRN
  Administered 2020-09-19: 4 mg via INTRAVENOUS
  Filled 2020-09-19: qty 2

## 2020-09-19 NOTE — H&P (Signed)
History and Physical  [facility]  [place] FVC:[id number] DOB: December 26, 1955 DOA: 09/19/2020  PCP: Oneal Grout, FNP  Patient coming from: Home   I have personally briefly reviewed patient's old medical records in [facility]  Chief Complaint: headache  HPI: Annette Rogers is a 65 y.o. female with medical history significant for prior stroke with chronic gastrostomy tube for nutrition and medications, GERD, hyperlipidemia, hyperparathyroidism, hypertension, chronic left-sided hemiplegia from CVA, nonrheumatic mitral valve stenosis status post metallic valve replacement, chronic anticoagulation with warfarin, history of tricuspid valve repair presented to the emergency department complaining of frontal headache that has been present for about 24 hours associated with malaise and nausea and vomiting.  Patient having tolerating tube feedings and reports that she believes she may be constipated.  She denies vision changes.  She denies focal weakness.  ED Course: Patient was evaluated in the ED and noted to have a potassium of 2.5 BUN 40, creatinine 1.45, anion gap 16, phosphorus 2.1, hemoglobin 9.4, hematocrit 29.9, platelet count 286.  Patient had an INR of 2.0.  Glucose 100.  SARS 2 coronavirus testing negative.  Influenza A and influenza B testing negative.  CT brain no findings of acute infarct.  Abdominal radiographs negative.  Patient was hypotensive on arrival.  Patient was a difficult IV stick and had to have a central line placed for vascular access.  IV fluids started electrolyte replacement started and admission requested for further management.  Review of Systems: Review of Systems  Constitutional: Positive for malaise/fatigue and weight loss. Negative for chills, diaphoresis and fever.  HENT: Negative.   Eyes: Negative.   Respiratory: Negative.   Cardiovascular: Negative.   Gastrointestinal: Positive for constipation, heartburn, nausea and vomiting. Negative for abdominal  pain, blood in stool, diarrhea and melena.  Genitourinary: Negative.   Musculoskeletal: Negative.   Skin: Negative.   Neurological: Positive for dizziness and weakness. Negative for sensory change, speech change, seizures, loss of consciousness and headaches.  Endo/Heme/Allergies: Negative.   Psychiatric/Behavioral: Negative.   All other systems reviewed and are negative.   Past Medical History:  Diagnosis Date  . COPD (chronic obstructive pulmonary disease) (HCC) 04/23/2020  . Gastrojejunostomy tube status (HCC) 05/31/2020  . GERD (gastroesophageal reflux disease) 07/15/2020  . High cholesterol   . Hypercalcemia 07/15/2020  . Hyperparathyroidism (HCC) 05/31/2020  . Hypertension   . Left hemiplegia (HCC) 04/29/2020  . Non-rheumatic mitral valve stenosis 12/09/2017  . Paroxysmal A-fib (HCC) 04/23/2020  . S/P mitral valve replacement with metallic valve 04/29/2020   Formatting of this note might be different from the original. [employer]. Jude regent mechanical  Procedures:   1. Mitral valve replacement (25 mm [employer] Regent) (CPT [phone]) 2. Tricuspid valve repair (annuloplasty only)  (30  mm CE Physio ring) (CPT 33464) 4. 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 Procedures:   1. Mitral valve replacement (25 mm [employer] Regent) (CPT [phone]) 2. Tricuspid valve repair (annuloplasty only)  (30  mm CE Physio ring) (CPT 33464) 4. 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 Stroke [facility])     Past Surgical History:  Procedure Laterality Date  . ABDOMINAL HYSTERECTOMY    . CORONARY ARTERY BYPASS GRAFT    . GASTROSTOMY W/ FEEDING TUBE    . IR GJ TUBE CHANGE  07/22/2020  . JOINT REPLACEMENT       reports that she  has never smoked. She has never used smokeless tobacco. She reports that she does not drink alcohol and does not use drugs.  Allergies  Allergen Reactions  .  Oxycodone Nausea Only    Family History  Problem Relation Age of Onset  . Hypertension Mother   . Stroke Father   . Hypertension Father   . Cancer - Colon Brother   . AAA (abdominal aortic aneurysm) Brother   . Heart disease Daughter      Prior to Admission medications   Medication Sig Start Date End Date Taking? Authorizing Provider  acetaminophen (TYLENOL) 325 MG tablet Take by mouth. 06/24/20   [provider]  amiodarone (PACERONE) 200 MG tablet Take by mouth. 06/22/20 06/22/21  [provider]  aspirin 81 MG EC tablet Take by mouth. 06/21/20   [provider]  atorvastatin (LIPITOR) 40 MG tablet Take by mouth. 06/21/20 06/21/21  [provider]  carvedilol (COREG) 3.125 MG tablet Take 1 tablet (3.125 mg total) by mouth 2 (two) times daily with a meal. 07/24/20   Lynleigh Kovack L, MD  Cholecalciferol 50 MCG (2000 UT) TABS Take by mouth. 06/21/20 06/21/21  [provider]  cinacalcet (SENSIPAR) 90 MG tablet Take by mouth. Patient not taking: Reported on 08/27/2020 06/24/20 09/22/20  [provider]  cyclobenzaprine (FLEXERIL) 5 MG tablet Take 1 tablet (5 mg total) by mouth 3 (three) times daily as needed. For muscle spasms.  May cause drowsiness 03/19/18   Triplett, Tammy, PA-C  diclofenac Sodium (VOLTAREN) 1 % GEL Apply topically. 06/21/20 06/21/21  [provider]  mirtazapine (REMERON) 30 MG tablet Take 30 mg by mouth at bedtime. 07/10/20   [provider]  Multiple Vitamin (MULTIVITAMIN WITH MINERALS) TABS tablet Place 1 tablet into feeding tube daily. Patient not taking: Reported on 08/27/2020 07/25/20   Cleora Fleet, MD  Nutritional Supplements (FEEDING SUPPLEMENT, OSMOLITE 1.5 CAL,) LIQD Place 1,000 mLs into feeding tube continuous. Patient not taking: Reported on 08/27/2020 07/24/20   Cleora Fleet, MD  nystatin (MYCOSTATIN/NYSTOP) powder Apply topically. 06/24/20 06/24/21  [provider]   pantoprazole (PROTONIX) 40 MG tablet Take by mouth.    [provider]  potassium & sodium phosphates (PHOS-NAK) 280-160-250 MG PACK Take 1 packet by mouth 4 (four) times daily -  with meals and at bedtime. 07/24/20   Makarios Madlock L, MD  senna-docusate (SENOKOT-S) 8.6-50 MG tablet Take 1 tablet by mouth 2 (two) times daily. 07/24/20   Brittane Grudzinski L, MD  sodium bicarbonate 650 MG tablet Take 1 tablet (650 mg total) by mouth 2 (two) times daily. 07/24/20   Ademola Vert L, MD  traMADol (ULTRAM) 50 MG tablet Take 1 tablet (50 mg total) by mouth every 6 (six) hours as needed. Patient not taking: Reported on 08/27/2020 03/19/18   Pauline Aus, PA-C  warfarin (COUMADIN) 2 MG tablet Take by mouth. Patient not taking: Reported on 08/27/2020 06/21/20 06/21/21  [provider]    Physical Exam: Vitals:   09/19/20 0700 09/19/20 0730 09/19/20 0800 09/19/20 0830  BP: (!) 77/58 (!) 81/45 (!) 70/34 (!) 87/38  Pulse: 85     Resp: 18 14 15 11   Temp:      TempSrc:      SpO2: 99%     Weight:      Height:        Constitutional: Chronically ill-appearing female lying in bed she is awake and alert in no apparent distress, calm, comfortable Eyes: PERRL, lids  and conjunctivae normal ENMT: Mucous membranes are dry. Posterior pharynx clear of any exudate or lesions. Neck: normal, supple, no masses, no thyromegaly, IJ central line right side neck. Respiratory: clear to auscultation bilaterally, no wheezing, no crackles. Normal respiratory effort. No accessory muscle use.  Cardiovascular: Normal S1-S2 sounds heard, no murmurs / rubs / gallops. No extremity edema. 2+ pedal pulses. No carotid bruits.  Abdomen: Gastrojejunostomy tube present in central abdomen appears functioning, no tenderness, no masses palpated. No hepatosplenomegaly. Bowel sounds positive.  Musculoskeletal: no clubbing / cyanosis. No joint deformity upper and lower extremities. Good ROM, no contractures.  Skin:  no rashes, lesions, ulcers. No induration. Neurologic: CN 2-12 grossly intact. Sensation intact, DTR normal.   Psychiatric: Alert and oriented x 3. Normal mood.   Labs on Admission: I have personally reviewed following labs and imaging studies  CBC: Recent Labs  Lab 09/19/20 0511  WBC 9.0  NEUTROABS 6.4  HGB 9.4*  HCT 29.9*  MCV 77.1*  PLT 286   Basic Metabolic Panel: Recent Labs  Lab 09/19/20 0511  NA 137  K 2.5*  CL 91*  CO2 30  GLUCOSE 100*  BUN 40*  CREATININE 1.45*  CALCIUM 10.0  MG 1.9  PHOS 2.1*   GFR: Estimated Creatinine Clearance: 34.3 mL/min (A) (by C-G formula based on SCr of 1.45 mg/dL (H)). Liver Function Tests: No results for input(s): AST, ALT, ALKPHOS, BILITOT, PROT, ALBUMIN in the last 168 hours. No results for input(s): LIPASE, AMYLASE in the last 168 hours. No results for input(s): AMMONIA in the last 168 hours. Coagulation Profile: Recent Labs  Lab 09/19/20 0511  INR 2.0*   Cardiac Enzymes: No results for input(s): CKTOTAL, CKMB, CKMBINDEX, TROPONINI in the last 168 hours. BNP (last 3 results) No results for input(s): PROBNP in the last 8760 hours. HbA1C: No results for input(s): HGBA1C in the last 72 hours. CBG: No results for input(s): GLUCAP in the last 168 hours. Lipid Profile: No results for input(s): CHOL, HDL, LDLCALC, TRIG, CHOLHDL, LDLDIRECT in the last 72 hours. Thyroid Function Tests: No results for input(s): TSH, T4TOTAL, FREET4, T3FREE, THYROIDAB in the last 72 hours. Anemia Panel: No results for input(s): VITAMINB12, FOLATE, FERRITIN, TIBC, IRON, RETICCTPCT in the last 72 hours. Urine analysis:    Component Value Date/Time   COLORURINE YELLOW 07/15/2020 2132   APPEARANCEUR HAZY (A) 07/15/2020 2132   LABSPEC 1.020 07/15/2020 2132   PHURINE 6.0 07/15/2020 2132   GLUCOSEU NEGATIVE 07/15/2020 2132   HGBUR SMALL (A) 07/15/2020 2132   BILIRUBINUR NEGATIVE 07/15/2020 2132   KETONESUR 80 (A) 07/15/2020 2132   PROTEINUR 30  (A) 07/15/2020 2132   NITRITE NEGATIVE 07/15/2020 2132   LEUKOCYTESUR MODERATE (A) 07/15/2020 2132    Radiological Exams on Admission: CT HEAD WO CONTRAST  Result Date: 09/19/2020 CLINICAL DATA:  Headache EXAM: CT HEAD WITHOUT CONTRAST TECHNIQUE: Contiguous axial images were obtained from the base of the skull through the vertex without intravenous contrast. COMPARISON:  None. FINDINGS: Brain: Remote right frontal infarct noted. Remote lacunar infarct involving the right caudate nucleus. No evidence of acute intracranial hemorrhage or infarct. No abnormal mass effect or midline shift. No abnormal intra or extra-axial mass lesion or fluid collection. Ventricular size is normal. Cerebellum is unremarkable. Vascular: No asymmetric hyperdense vasculature at the skull base Skull: Intact Sinuses/Orbits: Moderate mucosal thickening within the right sphenoid sinus. Remaining paranasal sinuses are clear. Visualized orbits are unremarkable. Other: Mastoid air cells and middle ear cavities are clear. IMPRESSION: Multiple remote infarcts.  No evidence of acute intracranial hemorrhage or infarct. Electronically Signed   By: Fidela Salisbury MD   On: 09/19/2020 04:55   DG Chest Portable 1 View  Result Date: 09/19/2020 CLINICAL DATA:  Status post PICC placement History of hypertension CABG, mitral valve replacement EXAM: PORTABLE CHEST 1 VIEW COMPARISON:  None. FINDINGS: The heart size and mediastinal contours are within normal limits. Both lungs are clear. The visualized skeletal structures are unremarkable. Right IJ central venous catheter terminates at the cavoatrial junction. Postsurgical changes of CABG and valve replacement are seen. IMPRESSION: Right IJ central venous catheter in appropriate position. No pneumothorax. Electronically Signed   By: Miachel Roux M.D.   On: 09/19/2020 07:49   DG ABD ACUTE 2+V W 1V CHEST  Result Date: 09/19/2020 CLINICAL DATA:  Constipation, hypertension, COPD EXAM: DG ABDOMEN ACUTE  WITH 1 VIEW CHEST COMPARISON:  None. FINDINGS: There is no evidence of dilated bowel loops or free intraperitoneal air. Gastrojejunostomy catheter appears looped with its tip within the expected proximal jejunum. Surgical clips noted within the mid abdomen. No radiopaque calculi or other significant radiographic abnormality is seen. Heart size and mediastinal contours are within normal limits. Coronary artery bypass grafting and mitral valve replacement have been performed. Both lungs are clear. IMPRESSION: Negative abdominal radiographs.  No acute cardiopulmonary disease. Electronically Signed   By: Fidela Salisbury MD   On: 09/19/2020 04:52   EKG: Independently reviewed.   Assessment/Plan Principal Problem:   Acute renal failure (ARF) (HCC) Active Problems:   COPD (chronic obstructive pulmonary disease) (HCC)   GERD (gastroesophageal reflux disease)   Hyperparathyroidism (HCC)   Non-rheumatic mitral valve stenosis   Paroxysmal A-fib (HCC)   Pulmonary hypertension (HCC)   S/P mitral valve replacement with metallic valve   S/P TVR (tricuspid valve repair)   Warfarin anticoagulation   Gastrojejunostomy tube status (HCC)   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 (HCC)   Hypokalemia   Hypercholesterolemia   On amiodarone therapy   Hypotension   Dehydration   AKI (acute kidney injury) ([place])   1. Acute renal failure-likely secondary to dehydration with poor free water intake and nausea and vomiting.  Treating with IV fluids.  She was bolused in the emergency department with improvement in blood pressure.  Continue IV fluid hydration. 2. Hypotension-secondary to dehydration no signs of infection have been filed.  This is improved with IV fluid bolus in the ED and will continue to monitor closely. 3. Hypokalemia-severe and being treated with IV potassium supplementation.  Follow closely. 4. Warfarin anticoagulation- consult to pharmacy for warfarin management in the hospital.   Daily PT/INR testing. 5. Paroxysmal atrial fibrillation- fully anticoagulated with warfarin, resume home beta-blocker therapy and amiodarone when medications have been reconciled. 6. Hyperparathyroidism-monitor electrolytes closely. 7. COPD-stable. 8. GERD- Protonix for GI protection. 9. Headache-likely secondary to severe dehydration, hypotension and electrolyte abnormalities, CT head with no acute findings.  Pain management as ordered.  Follow. 10. Constipation-added MiraLAX daily.   DVT prophylaxis: Warfarin Code Status: Full Family Communication: Patient updated with plan of care at bedside Disposition Plan: TBD Consults called: PT Admission status: INP  Victorio Creeden MD [facility] How to contact the [facility] Attending or Consulting provider [place] or covering provider during after hours [place], for this patient?  1. Check the care team in [facility] and look for a) attending/consulting [facility] provider listed and b) the [facility] team listed 2. Log into [email or web address] and use [place]'s universal password to  access. If you do not have the password, please contact the hospital operator. 3. Locate the [facility] provider you are looking for under [facility] and page to a number that you can be directly reached. 4. If you still have difficulty reaching the provider, please page the [facility] (Director on Call) for the Hospitalists listed on amion for assistance.   If 7PM-7AM, please contact night-coverage [email or web address] Password [email or web address]  09/19/2020, 9:05 AM

## 2020-09-19 NOTE — ED Notes (Signed)
Blood work and fluid are delayed due to difficult stick.

## 2020-09-19 NOTE — ED Notes (Signed)
Patient transported to CT 

## 2020-09-19 NOTE — ED Notes (Signed)
Patient transported to X-ray 

## 2020-09-19 NOTE — ED Notes (Signed)
Pt has lots of sores on her bottom. Sores are pink and clean, no sings of infection present.

## 2020-09-19 NOTE — ED Provider Notes (Signed)
[facility] EMERGENCY DEPARTMENT Provider Note   CSN: [id number] Arrival date & time: 09/19/20  0304     History Chief Complaint  Patient presents with  . Migraine    Annette Rogers is a 65 y.o. female.  Patient presents to the emergency department for evaluation of headache.  She comes to the ER from home.  Patient reports a diffuse frontal headache that has been present for most of the day.  Patient does report that she has had similar headaches in the past.  No rapid onset, no visual changes.  Patient has been ill today.  She has had repeated nausea and vomiting, has not been able to hold anything down.  Patient denies abdominal pain.  She does, however, report that she feels constipated.        Past Medical History:  Diagnosis Date  . COPD (chronic obstructive pulmonary disease) (HCC) 04/23/2020  . Gastrojejunostomy tube status (HCC) 05/31/2020  . GERD (gastroesophageal reflux disease) 07/15/2020  . High cholesterol   . Hypercalcemia 07/15/2020  . Hyperparathyroidism (HCC) 05/31/2020  . Hypertension   . Left hemiplegia (HCC) 04/29/2020  . Non-rheumatic mitral valve stenosis 12/09/2017  . Paroxysmal A-fib (HCC) 04/23/2020  . S/P mitral valve replacement with metallic valve 04/29/2020   Formatting of this note might be different from the original. [employer]. Jude regent mechanical  Procedures:   1. Mitral valve replacement (25 mm [employer] Jude Regent) (CPT [phone]) 2. Tricuspid valve repair (annuloplasty only)  (30  mm CE Physio ring) (CPT 33464) 4. 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 Procedures:   1. Mitral valve replacement (25 mm [employer] Jude Regent) (CPT [phone]) 2. Tricuspid valve repair (annuloplasty only)  (30  mm CE Physio ring) (CPT 33464) 4. 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 Stroke [facility])     Patient Active Problem List   Diagnosis Date Noted  .  Chronic knee pain 08/27/2020  . 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 On amiodarone therapy 07/29/2020  . Malfunction of percutaneous endoscopic gastrostomy (PEG) tube (HCC)   . Goals of care, counseling/discussion   . Palliative care by specialist   . Hypomagnesemia 07/16/2020  . Hypophosphatemia 07/16/2020  . Hypercalcemia 07/15/2020  . GERD (gastroesophageal reflux disease) 07/15/2020  . Gastric ulcer 07/15/2020  . Hypertension   . Hypokalemia   . Hyponatremia   . Normocytic anemia   . Hyperparathyroidism (HCC) 05/31/2020  . Gastrojejunostomy tube status (HCC) 05/31/2020  . Warfarin anticoagulation 05/15/2020  . Thrombocytosis 05/06/2020  . Left hemiplegia (HCC) 04/29/2020  . S/P mitral valve replacement with metallic valve 04/29/2020  . On enteral nutrition 04/29/2020  . 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 COPD (chronic obstructive pulmonary disease) (HCC) 04/23/2020  . Paroxysmal A-fib (HCC) 04/23/2020  . Pulmonary hypertension (HCC) 04/23/2020  . Hypercholesterolemia 04/23/2020  . Non-rheumatic mitral valve stenosis 12/09/2017    Past Surgical History:  Procedure Laterality Date  . ABDOMINAL HYSTERECTOMY    . CORONARY ARTERY BYPASS GRAFT    . GASTROSTOMY W/ FEEDING TUBE    . IR GJ TUBE CHANGE  07/22/2020  . JOINT REPLACEMENT       OB History   No obstetric history on file.     Family History  Problem Relation Age of Onset  . Hypertension Mother   .  Stroke Father   . Hypertension Father   . Cancer - Colon Brother   . AAA (abdominal aortic aneurysm) Brother   . Heart disease Daughter     Social History   Tobacco Use  . Smoking status: Never Smoker  . Smokeless tobacco: Never Used  Substance Use Topics  . Alcohol use: Never  . Drug use: Never    Home Medications Prior to Admission medications   Medication Sig Start Date End Date Taking? Authorizing Provider   acetaminophen (TYLENOL) 325 MG tablet Take by mouth. 06/24/20   [provider]  amiodarone (PACERONE) 200 MG tablet Take by mouth. 06/22/20 06/22/21  [provider]  aspirin 81 MG EC tablet Take by mouth. 06/21/20   [provider]  atorvastatin (LIPITOR) 40 MG tablet Take by mouth. 06/21/20 06/21/21  [provider]  carvedilol (COREG) 3.125 MG tablet Take 1 tablet (3.125 mg total) by mouth 2 (two) times daily with a meal. 07/24/20   Johnson, Clanford L, MD  Cholecalciferol 50 MCG (2000 UT) TABS Take by mouth. 06/21/20 06/21/21  [provider]  cinacalcet (SENSIPAR) 90 MG tablet Take by mouth. Patient not taking: Reported on 08/27/2020 06/24/20 09/22/20  [provider]  cyclobenzaprine (FLEXERIL) 5 MG tablet Take 1 tablet (5 mg total) by mouth 3 (three) times daily as needed. For muscle spasms.  May cause drowsiness 03/19/18   Triplett, Tammy, PA-C  diclofenac Sodium (VOLTAREN) 1 % GEL Apply topically. 06/21/20 06/21/21  [provider]  mirtazapine (REMERON) 30 MG tablet Take 30 mg by mouth at bedtime. 07/10/20   [provider]  Multiple Vitamin (MULTIVITAMIN WITH MINERALS) TABS tablet Place 1 tablet into feeding tube daily. Patient not taking: Reported on 08/27/2020 07/25/20   Cleora Fleet, MD  Nutritional Supplements (FEEDING SUPPLEMENT, OSMOLITE 1.5 CAL,) LIQD Place 1,000 mLs into feeding tube continuous. Patient not taking: Reported on 08/27/2020 07/24/20   Cleora Fleet, MD  nystatin (MYCOSTATIN/NYSTOP) powder Apply topically. 06/24/20 06/24/21  [provider]  pantoprazole (PROTONIX) 40 MG tablet Take by mouth.    [provider]  potassium & sodium phosphates (PHOS-NAK) 280-160-250 MG PACK Take 1 packet by mouth 4 (four) times daily -  with meals and at bedtime. 07/24/20   Johnson, Clanford L, MD  senna-docusate (SENOKOT-S) 8.6-50 MG tablet Take 1 tablet by mouth 2 (two) times daily. 07/24/20    Johnson, Clanford L, MD  sodium bicarbonate 650 MG tablet Take 1 tablet (650 mg total) by mouth 2 (two) times daily. 07/24/20   Johnson, Clanford L, MD  traMADol (ULTRAM) 50 MG tablet Take 1 tablet (50 mg total) by mouth every 6 (six) hours as needed. Patient not taking: Reported on 08/27/2020 03/19/18   Pauline Aus, PA-C  warfarin (COUMADIN) 2 MG tablet Take by mouth. Patient not taking: Reported on 08/27/2020 06/21/20 06/21/21  [provider]    Allergies    Oxycodone  Review of Systems   Review of Systems  Constitutional: Negative for fever.  Gastrointestinal: Positive for constipation, nausea and vomiting.  Neurological: Positive for headaches.  All other systems reviewed and are negative.   Physical Exam Updated Vital Signs BP (!) 84/49   Pulse 88   Temp 97.6 F (36.4 C) (Oral)   Resp 16   Ht 5' (1.524 m)   Wt 74 kg   SpO2 100%   BMI 31.86 kg/m   Physical Exam Vitals and nursing note reviewed.  Constitutional:      General:  She is not in acute distress.    Appearance: Normal appearance. She is well-developed and well-nourished.  HENT:     Head: Normocephalic and atraumatic.     Right Ear: Hearing normal.     Left Ear: Hearing normal.     Nose: Nose normal.     Mouth/Throat:     Mouth: Oropharynx is clear and moist and mucous membranes are normal.  Eyes:     Extraocular Movements: EOM normal.     Conjunctiva/sclera: Conjunctivae normal.     Pupils: Pupils are equal, round, and reactive to light.  Cardiovascular:     Rate and Rhythm: Regular rhythm.     Heart sounds: S1 normal and S2 normal. No murmur heard. No friction rub. No gallop.   Pulmonary:     Effort: Pulmonary effort is normal. No respiratory distress.     Breath sounds: Normal breath sounds.  Chest:     Chest wall: No tenderness.  Abdominal:     General: Bowel sounds are normal.     Palpations: Abdomen is soft. There is no hepatosplenomegaly.     Tenderness: There is no abdominal  tenderness. There is no guarding or rebound. Negative signs include Murphy's sign and McBurney's sign.     Hernia: No hernia is present.  Musculoskeletal:        General: No swelling.     Cervical back: Normal range of motion and neck supple.  Skin:    General: Skin is warm, dry and intact.     Findings: No rash.     Nails: There is no cyanosis.  Neurological:     Mental Status: She is alert and oriented to person, place, and time.     GCS: GCS eye subscore is 4. GCS verbal subscore is 5. GCS motor subscore is 6.     Sensory: No sensory deficit.     Motor: Weakness present.     Coordination: Coordination normal.     Deep Tendon Reflexes: Strength normal.     Comments: Dysarthric, left hemiparesis  Psychiatric:        Mood and Affect: Mood and affect normal.        Speech: Speech normal.        Behavior: Behavior normal.        Thought Content: Thought content normal.     ED Results / Procedures / Treatments   Labs (all labs ordered are listed, but only abnormal results are displayed) Labs Reviewed  CBC WITH DIFFERENTIAL/PLATELET - Abnormal; Notable for the following components:      Result Value   Hemoglobin 9.4 (*)    HCT 29.9 (*)    MCV 77.1 (*)    MCH 24.2 (*)    RDW 20.1 (*)    All other components within normal limits  BASIC METABOLIC PANEL - Abnormal; Notable for the following components:   Potassium 2.5 (*)    Chloride 91 (*)    Glucose, Bld 100 (*)    BUN 40 (*)    Creatinine, Ser 1.45 (*)    GFR, Estimated 40 (*)    Anion gap 16 (*)    All other components within normal limits  PROTIME-INR - Abnormal; Notable for the following components:   Prothrombin Time 22.0 (*)    INR 2.0 (*)    All other components within normal limits  PHOSPHORUS - Abnormal; Notable for the following components:   Phosphorus 2.1 (*)    All other components within normal limits  RESP PANEL BY RT-PCR (FLU A&B, COVID) ARPGX2  URINE CULTURE  MAGNESIUM  LACTIC ACID, PLASMA   URINALYSIS, ROUTINE W REFLEX MICROSCOPIC    EKG EKG Interpretation  Date/Time:  Thursday September 19 2020 03:32:52 EST Ventricular Rate:  91 PR Interval:    QRS Duration: 108 QT Interval:  451 QTC Calculation: 555 R Axis:   52 Text Interpretation: Sinus rhythm Repol abnrm suggests ischemia, diffuse leads Prolonged QT interval No significant change since last tracing Confirmed by Orpah Greek [phone]) on 09/19/2020 4:27:56 AM   Radiology CT HEAD WO CONTRAST  Result Date: 09/19/2020 CLINICAL DATA:  Headache EXAM: CT HEAD WITHOUT CONTRAST TECHNIQUE: Contiguous axial images were obtained from the base of the skull through the vertex without intravenous contrast. COMPARISON:  None. FINDINGS: Brain: Remote right frontal infarct noted. Remote lacunar infarct involving the right caudate nucleus. No evidence of acute intracranial hemorrhage or infarct. No abnormal mass effect or midline shift. No abnormal intra or extra-axial mass lesion or fluid collection. Ventricular size is normal. Cerebellum is unremarkable. Vascular: No asymmetric hyperdense vasculature at the skull base Skull: Intact Sinuses/Orbits: Moderate mucosal thickening within the right sphenoid sinus. Remaining paranasal sinuses are clear. Visualized orbits are unremarkable. Other: Mastoid air cells and middle ear cavities are clear. IMPRESSION: Multiple remote infarcts. No evidence of acute intracranial hemorrhage or infarct. Electronically Signed   By: Fidela Salisbury MD   On: 09/19/2020 04:55   DG ABD ACUTE 2+V W 1V CHEST  Result Date: 09/19/2020 CLINICAL DATA:  Constipation, hypertension, COPD EXAM: DG ABDOMEN ACUTE WITH 1 VIEW CHEST COMPARISON:  None. FINDINGS: There is no evidence of dilated bowel loops or free intraperitoneal air. Gastrojejunostomy catheter appears looped with its tip within the expected proximal jejunum. Surgical clips noted within the mid abdomen. No radiopaque calculi or other significant radiographic  abnormality is seen. Heart size and mediastinal contours are within normal limits. Coronary artery bypass grafting and mitral valve replacement have been performed. Both lungs are clear. IMPRESSION: Negative abdominal radiographs.  No acute cardiopulmonary disease. Electronically Signed   By: Fidela Salisbury MD   On: 09/19/2020 04:52    Procedures .Central Line  Date/Time: 09/19/2020 6:46 AM Performed by: Orpah Greek, MD Authorized by: Orpah Greek, MD   Consent:    Consent obtained:  Verbal   Consent given by:  Patient   Risks, benefits, and alternatives were discussed: yes     Risks discussed:  Arterial puncture, incorrect placement, nerve damage, pneumothorax, infection and bleeding Pre-procedure details:    Indication(s): central venous access     Hand hygiene: Hand hygiene performed prior to insertion     Sterile barrier technique: All elements of maximal sterile technique followed     Skin preparation:  Chlorhexidine   Skin preparation agent: Skin preparation agent completely dried prior to procedure   Sedation:    Sedation type:  None Anesthesia:    Anesthesia method:  Local infiltration   Local anesthetic:  Lidocaine 1% w/o epi Procedure details:    Location:  R internal jugular   Patient position:  Supine   Procedural supplies:  Triple lumen   Catheter size:  7 Fr   Landmarks identified: yes     Ultrasound guidance: yes     Ultrasound guidance timing: real time     Sterile ultrasound techniques: Sterile gel and sterile probe covers were used     Number of attempts:  1   Successful placement: yes   Post-procedure details:  Post-procedure:  Dressing applied and line sutured   Assessment:  Blood return through all ports, no pneumothorax on x-ray, placement verified by x-ray and free fluid flow   Procedure completion:  Tolerated well, no immediate complications   (including critical care time)  Medications Ordered in ED Medications  potassium  chloride 10 mEq in 100 mL IVPB (has no administration in time range)  sodium chloride 0.9 % bolus 1,000 mL (0 mLs Intravenous Paused 09/19/20 0600)    ED Course  I have reviewed the triage vital signs and the nursing notes.  Pertinent labs & imaging results that were available during my care of the patient were reviewed by me and considered in my medical decision making (see chart for details).    MDM Rules/Calculators/A&P                          Patient presents to the emergency department with complaints of headache.  Patient reports similar headaches in the past.  She has had strokes.  She is also on Coumadin, however, therefore CT head was performed at arrival.  This did not show any acute abnormality.  I did speak with the patient's daughter at home, who is her primary caregiver.  Daughter reports that the patient has had nausea and vomiting throughout the day.  No hematemesis.  She has not had any rectal bleeding or melanotic stools, has felt constipated.  X-ray does not show any obstruction.  Normal lung fields.  Patient noted to be hypotensive at arrival.  She is afebrile.  Doubt sepsis.  Patient appears volume depleted.  Creatinine has more than doubled and BUN is significantly elevated as well, suggesting dehydration and prerenal azotemia.  Nursing staff could not establish an IV at arrival.  I did evaluate and attempt peripheral IVs myself and was unsuccessful.  Patient fairly significantly hypokalemic.  It was felt she would require IV replacement as well as hydration, therefore it was decided to place a central line.  This was done without difficulty.  Reviewing her records reveal she has had significant electrolyte abnormalities in the past.  Magnesium and phosphorus were added on, not significantly abnormal.  We will initiate fluid bolus and potassium replacement via IV.  Admit patient to hospital.  Final Clinical Impression(s) / ED Diagnoses Final diagnoses:  Bad headache   Diarrhea, unspecified type  Hypokalemia    Rx / DC Orders ED Discharge Orders    None       Gilda Crease, MD 09/19/20 [phone]

## 2020-09-19 NOTE — ED Notes (Signed)
Pt. Has skin tears on their left hip to their bottom.

## 2020-09-19 NOTE — ED Notes (Signed)
Date and time results received: 09/19/20 0555 (use smartphrase ".now" to insert current time)  Test: potassium Critical Value: 2.5  Name of Provider Notified: dr Blinda Leatherwood  Orders Received? Or Actions Taken?: no orders received

## 2020-09-19 NOTE — Progress Notes (Signed)
ANTICOAGULATION CONSULT NOTE - Initial Consult  Pharmacy Consult for warfarin Indication: atrial fibrillation  Allergies  Allergen Reactions  . Oxycodone Nausea Only    Patient Measurements: Height: 5' (152.4 cm) Weight: 74 kg (163 lb 2.3 oz) IBW/kg (Calculated) : 45.5 Heparin Dosing Weight:   Vital Signs: Temp: 97.6 F (36.4 C) (01/06 0329) Temp Source: Oral (01/06 0329) BP: 90/51 (01/06 1030) Pulse Rate: 85 (01/06 0700)  Labs: Recent Labs    09/19/20 0511  HGB 9.4*  HCT 29.9*  PLT 286  LABPROT 22.0*  INR 2.0*  CREATININE 1.45*    Estimated Creatinine Clearance: 34.3 mL/min (A) (by C-G formula based on SCr of 1.45 mg/dL (H)).   Medical History: Past Medical History:  Diagnosis Date  . COPD (chronic obstructive pulmonary disease) (HCC) 04/23/2020  . Gastrojejunostomy tube status (HCC) 05/31/2020  . GERD (gastroesophageal reflux disease) 07/15/2020  . High cholesterol   . Hypercalcemia 07/15/2020  . Hyperparathyroidism (HCC) 05/31/2020  . Hypertension   . Left hemiplegia (HCC) 04/29/2020  . Non-rheumatic mitral valve stenosis 12/09/2017  . Paroxysmal A-fib (HCC) 04/23/2020  . S/P mitral valve replacement with metallic valve 04/29/2020   Formatting of this note might be different from the original. St. Jude regent mechanical  Procedures:   1. Mitral valve replacement (25 mm [employer]. Jude Regent) (CPT [phone]) 2. Tricuspid valve repair (annuloplasty only)  (30  mm CE Physio ring) (CPT 33464) 4. 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 Procedures:   1. Mitral valve replacement (25 mm [employer]. Jude Regent) (CPT [phone]) 2. Tricuspid valve repair (annuloplasty only)  (30  mm CE Physio ring) (CPT 33464) 4. 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 Stroke [facility])     Medications:  (Not in a hospital admission)   Assessment: Pharmacy consulted to dose  warfarin in patient with atrial fibrillation.  INR on admission therapeutic at 2.0. Home dose listed as 2 mg daily.  Patient on amiodarone PTA.  hgb 9.4  Goal of Therapy:  INR 2-3 Monitor platelets by anticoagulation protocol: Yes   Plan:  Warfarin 2 mg x 1 dose. Monitor daily INR and s/s of bleeding.  Judeth Cornfield, PharmD Clinical Pharmacist 09/19/2020 10:58 AM

## 2020-09-19 NOTE — ED Notes (Signed)
CRITICAL VALUE ALERT  Critical Value:  Potassium 2.5 Date & Time Notied:  09/19/20 @ 0552 Provider Notified: Dr Blinda Leatherwood Orders Received/Actions taken: None yet

## 2020-09-19 NOTE — Progress Notes (Signed)
[facility] Community Based Palliative Care  This patient is enrolled in our palliative care services in the community.   [facility] will continue to follow for any discharge planning needs and to coordinate continuation of palliative care.  If you have questions or need assistance, please call [phone] or contact the hospital Liaison listed on AMION.  Thank you for the opportunity to participate in this patient's care.  Sherry Gibson, RN [facility] Hospital Liaison [phone] 

## 2020-09-19 NOTE — ED Triage Notes (Signed)
Pt brought in by EMS for c/o headache x 1 day.

## 2020-09-20 DIAGNOSIS — Z954 Presence of other heart-valve replacement: Secondary | ICD-10-CM

## 2020-09-20 DIAGNOSIS — Z9889 Other specified postprocedural states: Secondary | ICD-10-CM

## 2020-09-20 DIAGNOSIS — Z7901 Long term (current) use of anticoagulants: Secondary | ICD-10-CM

## 2020-09-20 LAB — COMPREHENSIVE METABOLIC PANEL
ALT: 37 U/L (ref 0–44)
AST: 67 U/L — ABNORMAL HIGH (ref 15–41)
Albumin: 1.8 g/dL — ABNORMAL LOW (ref 3.5–5.0)
Alkaline Phosphatase: 83 U/L (ref 38–126)
Anion gap: 6 (ref 5–15)
BUN: 33 mg/dL — ABNORMAL HIGH (ref 8–23)
CO2: 26 mmol/L (ref 22–32)
Calcium: 8.3 mg/dL — ABNORMAL LOW (ref 8.9–10.3)
Chloride: 106 mmol/L (ref 98–111)
Creatinine, Ser: 0.99 mg/dL (ref 0.44–1.00)
GFR, Estimated: >60 mL/min (ref >60)
Glucose, Bld: 143 mg/dL — ABNORMAL HIGH (ref 70–99)
Potassium: 2.9 mmol/L — ABNORMAL LOW (ref 3.5–5.1)
Sodium: 138 mmol/L (ref 135–145)
Total Bilirubin: 1.1 mg/dL (ref 0.3–1.2)
Total Protein: 5.3 g/dL — ABNORMAL LOW (ref 6.5–8.1)

## 2020-09-20 LAB — GLUCOSE, CAPILLARY
Glucose-Capillary: 56 mg/dL — ABNORMAL LOW (ref 70–99)
Glucose-Capillary: 62 mg/dL — ABNORMAL LOW (ref 70–99)
Glucose-Capillary: 119 mg/dL — ABNORMAL HIGH (ref 70–99)
Glucose-Capillary: 110 mg/dL — ABNORMAL HIGH (ref 70–99)

## 2020-09-20 LAB — CBC
HCT: 22.7 % — ABNORMAL LOW (ref 36.0–46.0)
Hemoglobin: 7.1 g/dL — ABNORMAL LOW (ref 12.0–15.0)
MCH: 24.1 pg — ABNORMAL LOW (ref 26.0–34.0)
MCHC: 31.3 g/dL (ref 30.0–36.0)
MCV: 77.2 fL — ABNORMAL LOW (ref 80.0–100.0)
Platelets: 333 10*3/uL (ref 150–400)
RBC: 2.94 MIL/uL — ABNORMAL LOW (ref 3.87–5.11)
RDW: 20.2 % — ABNORMAL HIGH (ref 11.5–15.5)
WBC: 6.6 10*3/uL (ref 4.0–10.5)
nRBC: 0 % (ref 0.0–0.2)

## 2020-09-20 LAB — MAGNESIUM: Magnesium: 1.5 mg/dL — ABNORMAL LOW (ref 1.7–2.4)

## 2020-09-20 LAB — PROTIME-INR
INR: 2.5 — ABNORMAL HIGH (ref 0.8–1.2)
Prothrombin Time: 26 seconds — ABNORMAL HIGH (ref 11.4–15.2)

## 2020-09-20 MED ORDER — OSMOLITE 1.5 CAL PO LIQD
1000.0000 mL | ORAL | Status: DC
  Administered 2020-09-20: 1000 mL

## 2020-09-20 MED ORDER — KCL IN DEXTROSE-NACL 20-5-0.9 MEQ/L-%-% IV SOLN: INTRAVENOUS | Status: DC

## 2020-09-20 MED ORDER — POTASSIUM CHLORIDE 10 MEQ/100ML IV SOLN
10.0000 meq | Freq: Once | INTRAVENOUS | Status: AC
  Administered 2020-09-20: 10 meq via INTRAVENOUS
  Filled 2020-09-20: qty 100

## 2020-09-20 MED ORDER — BISACODYL 10 MG RE SUPP
10.0000 mg | Freq: Once | RECTAL | Status: AC
  Administered 2020-09-20: 10 mg via RECTAL
  Filled 2020-09-20: qty 1

## 2020-09-20 MED ORDER — MAGNESIUM SULFATE 4 GM/100ML IV SOLN
4.0000 g | Freq: Once | INTRAVENOUS | Status: AC
  Administered 2020-09-20: 4 g via INTRAVENOUS
  Filled 2020-09-20: qty 100

## 2020-09-20 MED ORDER — GLUCOSE 40 % PO GEL
ORAL | Status: AC
  Filled 2020-09-20: qty 1

## 2020-09-20 MED ORDER — WARFARIN SODIUM 1 MG PO TABS
1.0000 mg | ORAL_TABLET | Freq: Once | ORAL | Status: AC
  Administered 2020-09-20: 1 mg via ORAL
  Filled 2020-09-20: qty 1

## 2020-09-20 MED ORDER — POTASSIUM CHLORIDE 10 MEQ/100ML IV SOLN
10.0000 meq | INTRAVENOUS | Status: AC
  Administered 2020-09-20 (×3): 10 meq via INTRAVENOUS
  Filled 2020-09-20 (×3): qty 100

## 2020-09-20 MED ORDER — DEXTROSE 50 % IV SOLN
INTRAVENOUS | Status: AC
  Administered 2020-09-20: 50 mL
  Filled 2020-09-20: qty 50

## 2020-09-20 MED ORDER — OSMOLITE 1.2 CAL PO LIQD: 1000.0000 mL | ORAL | Status: DC

## 2020-09-20 NOTE — TOC Initial Note (Signed)
Transition of Care [facility]) - Initial/Assessment Note    Patient Details  Name: Annette Rogers MRN: [id number] Date of Birth: [birth year]  Transition of Care [facility] CM/SW Contact:    Shade Flood, LCSW Phone Number: 09/20/2020, 2:43 PM  Clinical Narrative:                  Pt admitted from home. She is high risk for readmission. Received contact from [place] that [place] had concerns about pt's family ability to provide care at home. Met with pt today to assess and offer SNF rehab placement. Per pt, she lives in her own home and she has a friend and a granddaughter that live with her. Pt states that her friend, gdtr, and other family members who don't live with her all participate in helping pt with her care. Per pt, she will not go to SNF at dc. She states that she will return home. Attempted to reach family contacts though did not receive an answer at any of the attempted phone calls.  Per pt, she has all DME except a [employer] fast streamer which she is trying to get. Pt is active with [facility] and they will resume care at dc. Prior to admission, [facility] had just received the referral from another [facility] agency who could not staff pt's needs. [facility] active with pt for RN, PT, OT and ST. Asked MD to resume these services along with MSW upon dc.   Pt is active with outpatient Palliative Care with [facility]. Per pt, her family helps obtain her medications. Pt has an APNP home visit scheduled for Monday of next week.   At this time, pt does not identify any additional TOC needs for dc. Weekend TOC will be available to assist as needed.  Expected Discharge Plan: [place] Barriers to Discharge: Continued Medical Work up   Patient Goals and CMS Choice Patient states their goals for this hospitalization and ongoing recovery are:: return home      Expected Discharge Plan and Services Expected Discharge Plan: [place] In-house Referral:  Clinical Social Work   Post Acute Care Choice: Resumption of Svcs/PTA Provider Living arrangements for the past 2 months: Single Family Home                                      Prior Living Arrangements/Services Living arrangements for the past 2 months: Single Family Home Lives with:: Relatives Patient language and need for interpreter reviewed:: Yes Do you feel safe going back to the place where you live?: Yes      Need for Family Participation in Patient Care: Yes (Comment) Care giver support system in place?: Yes (comment) Current home services: DME,Home OT,Home PT,Home RN Criminal Activity/Legal Involvement Pertinent to Current Situation/Hospitalization: No - Comment as needed  Activities of Daily Living Home Assistive Devices/Equipment: Wheelchair ADL Screening (condition at time of admission) Patient's cognitive ability adequate to safely complete daily activities?: Yes Is the patient deaf or have difficulty hearing?: No Does the patient have difficulty seeing, even when wearing glasses/contacts?: No Does the patient have difficulty concentrating, remembering, or making decisions?: No Patient able to express need for assistance with ADLs?: Yes Does the patient have difficulty dressing or bathing?: Yes Independently performs ADLs?: No Communication: Independent Dressing (OT): Dependent Is this a change from baseline?: Pre-admission baseline Grooming: Dependent Is this a change from baseline?:  Pre-admission baseline Feeding: Independent Bathing: Dependent Is this a change from baseline?: Pre-admission baseline Toileting: Dependent Is this a change from baseline?: Pre-admission baseline In/Out Bed: Dependent Is this a change from baseline?: Pre-admission baseline Walks in Home: Dependent Is this a change from baseline?: Pre-admission baseline Does the patient have difficulty walking or climbing stairs?: Yes Weakness of Legs: Both Weakness of Arms/Hands:  Both  Permission Sought/Granted                  Emotional Assessment Appearance:: Appears older than stated age Attitude/Demeanor/Rapport: Engaged Affect (typically observed): Pleasant Orientation: : Oriented to Self,Oriented to Place,Oriented to  Time,Oriented to Situation Alcohol / Substance Use: Not Applicable Psych Involvement: No (comment)  Admission diagnosis:  Hypokalemia [E87.6] Bad headache [R51.9] Acute renal failure (ARF) (HCC) [N17.9] Hypotension [I95.9] Diarrhea, unspecified type [R19.7] Patient Active Problem List   Diagnosis Date Noted  . Hypotension 09/19/2020  . Dehydration 09/19/2020  . AKI (acute kidney injury) ([place]) 09/19/2020  . Acute renal failure (ARF) ([place]) 09/19/2020  . Chronic knee pain 08/27/2020  . 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 On amiodarone therapy 07/29/2020  . Malfunction of percutaneous endoscopic gastrostomy (PEG) tube ([place])   . Goals of care, counseling/discussion   . Palliative care by specialist   . Hypomagnesemia 07/16/2020  . Hypophosphatemia 07/16/2020  . Hypercalcemia 07/15/2020  . GERD (gastroesophageal reflux disease) 07/15/2020  . Gastric ulcer 07/15/2020  . Hypertension   . Hypokalemia   . Hyponatremia   . Normocytic anemia   . Hyperparathyroidism ([place]) 05/31/2020  . Gastrojejunostomy tube status ([place]) 05/31/2020  . Warfarin anticoagulation 05/15/2020  . Thrombocytosis 05/06/2020  . Left hemiplegia ([place]) 04/29/2020  . S/P mitral valve replacement with metallic valve 16/38/4665  . On enteral nutrition 04/29/2020  . 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 ([place]) 04/27/2020  . 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 COPD (chronic obstructive pulmonary disease) ([place]) 04/23/2020  . Paroxysmal A-fib ([place]) 04/23/2020  . Pulmonary hypertension ([place]) 04/23/2020  . Hypercholesterolemia 04/23/2020  . Non-rheumatic mitral valve stenosis 12/09/2017   PCP:  Ludwig Clarks, FNP Pharmacy:   [place], [place] - [address] [place] [address] Phone: [phone] Fax: [phone]     Social Determinants of Health (SDOH) Interventions    Readmission Risk Interventions Readmission Risk Prevention Plan 09/20/2020  Transportation Screening Complete  PCP or Specialist Appt within 3-5 Days Complete  HRI or [place] Complete  Social Work Consult for [place] Planning/Counseling Complete  Palliative Care Screening Not Applicable  Medication Review Press photographer) Complete  Some recent data might be hidden

## 2020-09-20 NOTE — Plan of Care (Signed)
  Problem: Acute Rehab PT Goals(only PT should resolve) Goal: Pt Will Go Supine/Side To Sit Outcome: Progressing Flowsheets (Taken 09/20/2020 0947) Pt will go Supine/Side to Sit: with moderate assist Goal: Pt Will Go Sit To Supine/Side Outcome: Progressing Flowsheets (Taken 09/20/2020 0947) Pt will go Sit to Supine/Side: with moderate assist  9:47 AM, 09/20/20 Wyman Songster PT, DPT Physical Therapist at [facility]

## 2020-09-20 NOTE — Progress Notes (Signed)
Initial Nutrition Assessment  DOCUMENTATION CODES:   Not applicable  INTERVENTION:  Initiate Osmolite 1.5 @ 20 ml/hr, advance 10 ml every 6 hrs as tolerated to goal 50 ml/hr  Additional free water per MD - IVF @ 45 ml/hr  Recommend 150 ml free water via G-tube every 4 hrs  This regimen will provide 1800 kcal, 75 grams of protein, and 912 ml free water (1812 ml total water with recommended flushes)  Continue to Monitor magnesium, potassium, and phosphorus daily for at least 3 days, MD to replete as needed, as pt is at risk for refeeding syndrome as noted below.   NUTRITION DIAGNOSIS:   Inadequate oral intake related to inability to eat (dysphagia, left-sided hemiplegia s/p stroke) as evidenced by  (G-tube dependent for nutrtion/hydration and medications).    GOAL:   Patient will meet greater than or equal to 90% of their needs    MONITOR:   Weight trends,Labs,I & O's,TF tolerance  REASON FOR ASSESSMENT:   Consult Enteral/tube feeding initiation and management  ASSESSMENT:  65 year old female admitted for acute renal failure presented with headache associated with malaise, nausea and vomiting over the past 24 hours. Past medical history significant of prior stroke s/p G-tube for nutrition and medications, GERD, HLD, hyperparathyroidism, HTN, chronic left-sided hemiplegia, nonrheumatic mitral valve stenosis s/p MV replacement, atrial fibrillation on chronic anticoagulation with warfarin, and COPD.  Patient sitting up in bed holding emesis basin, reports feeling nauseas with episode of vomiting prior to RD visit, noted small amount of yellowish-green emesis in basin. Nurse at bedside, reports will return with nausea medication. Patient reports drinking some water this morning prior to vomiting. She is familiar to nutrition department and seen by this RD during previous admission, tolerating Osmolite 1.5 @ 55 ml/hr prior to discharge in November. Pt reports recently switching to  a different formula, unable to recall what she is now receiving. Patient states she has not been tolerating new formula well, states it makes her nauseas. Pt reports not taking any food/drink other than a little bit of water by mouth.   Per chart review, daughter of pt contacted PCP due to clogged feeding tube on 1/3 after home health was unable to see pt do to staffing issues. Daughter reported home health instructed pt not to use tube. Appears PCP arranged [facility] RN to see pt on 1/5. Unsure how long pt went without tube feedings/fluid. Noted dehydrated with electrolyte abnormalities in emergency department. Will start  Osmolite 1.5 via G-tube, advance slowly towards goal as pt is a high risk for refeeding given above. Recommend continuing to monitor  magnesium, potassium, and phosphorus daily as tube feeds progress towards goal and labs stable.    Per chart, weights stable over the last 2 months.  Medications reviewed and include: Miralx, Warfarin  IVF: NaCl with KCl 20 mEq/L @ 45 ml/hr  Labs: K 2.9 (L), BUN 33 (H), Mg 1.5 (L), P 1.9 (WNL)   NUTRITION - FOCUSED PHYSICAL EXAM: Deferred,  pt is nauseas with vomiting  Diet Order:   Diet Order            Diet clear liquid Room service appropriate? Yes; Fluid consistency: Thin  Diet effective now                 EDUCATION NEEDS:   No education needs have been identified at this time  Skin:  Skin Assessment: Reviewed RN Assessment  Last BM:  pta  Height:  Ht Readings from Last 1 Encounters:  09/19/20 5' (1.524 m)    Weight:   Wt Readings from Last 1 Encounters:  09/19/20 74 kg    BMI:  Body mass index is 31.86 kg/m.  Estimated Nutritional Needs:   Kcal:  1600-1800  Protein:  75-90  Fluid:  >1.6 L   Lars Masson, RD, LDN Clinical Nutrition After Hours/Weekend Pager # in Amion

## 2020-09-20 NOTE — Progress Notes (Signed)
PROGRESS NOTE   Annette Rogers  CHE:[id number] DOB: 1955-10-25 DOA: 09/19/2020 PCP: Ludwig Clarks, FNP   Chief Complaint  Patient presents with  . Migraine    Brief Admission History:  65 y.o. female with medical history significant for prior stroke with chronic gastrostomy tube for nutrition and medications, GERD, hyperlipidemia, hyperparathyroidism, hypertension, chronic left-sided hemiplegia from CVA, nonrheumatic mitral valve stenosis status post metallic valve replacement, chronic anticoagulation with warfarin, history of tricuspid valve repair presented to the emergency department complaining of frontal headache that has been present for about 24 hours associated with malaise and nausea and vomiting.  Patient having tolerating tube feedings and reports that she believes she may be constipated.  She denies vision changes.  She denies focal weakness.  ED Course: Patient was evaluated in the ED and noted to have a potassium of 2.5 BUN 40, creatinine 1.45, anion gap 16, phosphorus 2.1, hemoglobin 9.4, hematocrit 29.9, platelet count 286.  Patient had an INR of 2.0.  Glucose 100.  SARS 2 coronavirus testing negative.  Influenza A and influenza B testing negative.  CT brain no findings of acute infarct.  Abdominal radiographs negative.  Patient was hypotensive on arrival.  Patient was a difficult IV stick and had to have a central line placed for vascular access.  IV fluids started electrolyte replacement started and admission requested for further management.  Assessment & Plan:   Principal Problem:   Acute renal failure (ARF) (HCC) Active Problems:   COPD (chronic obstructive pulmonary disease) (HCC)   GERD (gastroesophageal reflux disease)   Hyperparathyroidism (HCC)   Non-rheumatic mitral valve stenosis   Paroxysmal A-fib (HCC)   Pulmonary hypertension (HCC)   S/P mitral valve replacement with metallic valve   S/P TVR (tricuspid valve repair)   Warfarin anticoagulation    Gastrojejunostomy tube status (HCC)   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 (HCC)   Hypokalemia   Hypercholesterolemia   On amiodarone therapy   Hypotension   Dehydration   AKI (acute kidney injury) ([place])  1. Acute renal failure-RESOLVED.  Secondary to dehydration with poor free water intake and nausea and vomiting.  Treated with IV fluids.  She was bolused in the emergency department with improvement in blood pressure.  Continue IV fluid hydration. 2. Hypotension-improved with rehydration, still holding oral antihypertensives.  Follow.  3. Anemia of chronic disease - hemoccult stool.  Transfuse for Hg<7.   4. Hypokalemia-severe and being treated with IV potassium supplementation.  Follow closely. 5. Hypomagnesemia - IV replacement given.  6. Warfarin anticoagulation- consult to pharmacy for warfarin management in the hospital.  Daily PT/INR testing. 7. Paroxysmal atrial fibrillation- fully anticoagulated with warfarin, resume home beta-blocker therapy and amiodarone when medications have been reconciled. 8. Hyperparathyroidism-monitor electrolytes closely. Outpatient follow up.  9. COPD-stable. 10. GERD- Protonix for GI protection. 11. Headache-likely secondary to severe dehydration, hypotension and electrolyte abnormalities, CT head with no acute findings.  Pain management as ordered.  Follow. 12. Constipation-added MiraLAX daily.  DVT prophylaxis: Warfarin Code Status: Full Family Communication: Patient updated with plan of care at bedside Disposition Plan: Home with HHPT, RN Consults called: PT Admission status: INP  Status is: Inpatient  Remains inpatient appropriate because:IV treatments appropriate due to intensity of illness or inability to take PO and Inpatient level of care appropriate due to severity of illness   Dispo: The patient is from: Home              Anticipated d/c is to: Home  Anticipated d/c date is: 1 day              Patient  currently is not medically stable to d/c.   Consultants:   n/a  Procedures:   n/a  Antimicrobials:  Fosfomycin 09/20/20   Subjective: Pt reports she does feel a little better today, no BM. No emesis.   Objective: Vitals:   09/19/20 1930 09/19/20 2051 09/19/20 2350 09/20/20 0441  BP: (!) 93/51  (!) 90/52 (!) 105/55  Pulse: 65  83 78  Resp: 17  18 16   Temp: (!) 97.5 F (36.4 C)  97.6 F (36.4 C) 97.6 F (36.4 C)  TempSrc:      SpO2: (!) 87% 94% 99% 91%  Weight:      Height:        Intake/Output Summary (Last 24 hours) at 09/20/2020 1139 Last data filed at 09/19/2020 1651 Gross per 24 hour  Intake 840.23 ml  Output --  Net 840.23 ml   Filed Weights   09/19/20 0306  Weight: 74 kg    Examination:  General exam: chronically ill and emaciated appearing, awake, and alert, Appears calm and comfortable  Respiratory system: Clear to auscultation. Respiratory effort normal. Cardiovascular system: normal S1 & S2 heard. No JVD, murmurs, rubs, gallops or clicks. No pedal edema. Gastrointestinal system: G-Tube site appears well.  Abdomen is nondistended, soft and nontender. No organomegaly or masses felt. Normal bowel sounds heard. Central nervous system: Alert and oriented. No focal neurological deficits. Extremities: Symmetric 5 x 5 power. Skin: No rashes, lesions or ulcers Psychiatry: Judgement and insight appear normal. Mood & affect appropriate.   Data Reviewed: I have personally reviewed following labs and imaging studies  CBC: Recent Labs  Lab 09/19/20 0511 09/20/20 0637  WBC 9.0 6.6  NEUTROABS 6.4  --   HGB 9.4* 7.1*  HCT 29.9* 22.7*  MCV 77.1* 77.2*  PLT 286 333    Basic Metabolic Panel: Recent Labs  Lab 09/19/20 0511 09/20/20 0637  NA 137 138  K 2.5* 2.9*  CL 91* 106  CO2 30 26  GLUCOSE 100* 143*  BUN 40* 33*  CREATININE 1.45* 0.99  CALCIUM 10.0 8.3*  MG 1.9 1.5*  PHOS 2.1*  --     GFR: Estimated Creatinine Clearance: 50.2 mL/min (by C-G  formula based on SCr of 0.99 mg/dL).  Liver Function Tests: Recent Labs  Lab 09/20/20 0637  AST 67*  ALT 37  ALKPHOS 83  BILITOT 1.1  PROT 5.3*  ALBUMIN 1.8*    CBG: No results for input(s): GLUCAP in the last 168 hours.  Recent Results (from the past 240 hour(s))  Resp Panel by RT-PCR (Flu A&B, Covid) Nasopharyngeal Swab     Status: None   Collection Time: 09/19/20  7:05 AM   Specimen: Nasopharyngeal Swab; Nasopharyngeal(NP) swabs in vial transport medium  Result Value Ref Range Status   SARS Coronavirus 2 by RT PCR NEGATIVE NEGATIVE Final    Comment: (NOTE) SARS-CoV-2 target nucleic acids are NOT DETECTED.  The SARS-CoV-2 RNA is generally detectable in upper respiratory specimens during the acute phase of infection. The lowest concentration of SARS-CoV-2 viral copies this assay can detect is 138 copies/mL. A negative result does not preclude SARS-Cov-2 infection and should not be used as the sole basis for treatment or other patient management decisions. A negative result may occur with  improper specimen collection/handling, submission of specimen other than nasopharyngeal swab, presence of viral mutation(s) within the areas targeted by this assay,  and inadequate number of viral copies(<138 copies/mL). A negative result must be combined with clinical observations, patient history, and epidemiological information. The expected result is Negative.  Fact Sheet for Patients:  BloggerCourse.com  Fact Sheet for Healthcare Providers:  SeriousBroker.it  This test is no t yet approved or cleared by the [place] FDA and  has been authorized for detection and/or diagnosis of SARS-CoV-2 by FDA under an Emergency Use Authorization (EUA). This EUA will remain  in effect (meaning this test can be used) for the duration of the COVID-19 declaration under Section 564(b)(1) of the Act, 21 U.S.C.section 360bbb-3(b)(1), unless the  authorization is terminated  or revoked sooner.       Influenza A by PCR NEGATIVE NEGATIVE Final   Influenza B by PCR NEGATIVE NEGATIVE Final    Comment: (NOTE) The Xpert Xpress SARS-CoV-2/FLU/RSV plus assay is intended as an aid in the diagnosis of influenza from Nasopharyngeal swab specimens and should not be used as a sole basis for treatment. Nasal washings and aspirates are unacceptable for Xpert Xpress SARS-CoV-2/FLU/RSV testing.  Fact Sheet for Patients: BloggerCourse.com  Fact Sheet for Healthcare Providers: SeriousBroker.it  This test is not yet approved or cleared by the [place] FDA and has been authorized for detection and/or diagnosis of SARS-CoV-2 by FDA under an Emergency Use Authorization (EUA). This EUA will remain in effect (meaning this test can be used) for the duration of the COVID-19 declaration under Section 564(b)(1) of the Act, 21 U.S.C. section 360bbb-3(b)(1), unless the authorization is terminated or revoked.  Performed at [facility], [address], [place], [place] [address]      Radiology Studies: CT HEAD WO CONTRAST  Result Date: 09/19/2020 CLINICAL DATA:  Headache EXAM: CT HEAD WITHOUT CONTRAST TECHNIQUE: Contiguous axial images were obtained from the base of the skull through the vertex without intravenous contrast. COMPARISON:  None. FINDINGS: Brain: Remote right frontal infarct noted. Remote lacunar infarct involving the right caudate nucleus. No evidence of acute intracranial hemorrhage or infarct. No abnormal mass effect or midline shift. No abnormal intra or extra-axial mass lesion or fluid collection. Ventricular size is normal. Cerebellum is unremarkable. Vascular: No asymmetric hyperdense vasculature at the skull base Skull: Intact Sinuses/Orbits: Moderate mucosal thickening within the right sphenoid sinus. Remaining paranasal sinuses are clear. Visualized orbits are unremarkable. Other:  Mastoid air cells and middle ear cavities are clear. IMPRESSION: Multiple remote infarcts. No evidence of acute intracranial hemorrhage or infarct. Electronically Signed   By: Helyn Numbers MD   On: 09/19/2020 04:55   DG Chest Portable 1 View  Result Date: 09/19/2020 CLINICAL DATA:  Status post PICC placement History of hypertension CABG, mitral valve replacement EXAM: PORTABLE CHEST 1 VIEW COMPARISON:  None. FINDINGS: The heart size and mediastinal contours are within normal limits. Both lungs are clear. The visualized skeletal structures are unremarkable. Right IJ central venous catheter terminates at the cavoatrial junction. Postsurgical changes of CABG and valve replacement are seen. IMPRESSION: Right IJ central venous catheter in appropriate position. No pneumothorax. Electronically Signed   By: Acquanetta Belling M.D.   On: 09/19/2020 07:49   DG ABD ACUTE 2+V W 1V CHEST  Result Date: 09/19/2020 CLINICAL DATA:  Constipation, hypertension, COPD EXAM: DG ABDOMEN ACUTE WITH 1 VIEW CHEST COMPARISON:  None. FINDINGS: There is no evidence of dilated bowel loops or free intraperitoneal air. Gastrojejunostomy catheter appears looped with its tip within the expected proximal jejunum. Surgical clips noted within the mid abdomen. No radiopaque calculi or other significant  radiographic abnormality is seen. Heart size and mediastinal contours are within normal limits. Coronary artery bypass grafting and mitral valve replacement have been performed. Both lungs are clear. IMPRESSION: Negative abdominal radiographs.  No acute cardiopulmonary disease. Electronically Signed   By: Helyn Numbers MD   On: 09/19/2020 04:52   Scheduled Meds: . Chlorhexidine Gluconate Cloth  6 each Topical Daily  . polyethylene glycol  17 g Per Tube Daily  . Warfarin - Pharmacist Dosing Inpatient   Does not apply q1600   Continuous Infusions: . 0.9 % NaCl with KCl 20 mEq / L 140 mL/hr at 09/19/20 1651  . potassium chloride 10 mEq (09/20/20  1107)     LOS: 1 day   Time spent: 35 mins   Ej Pinson Laural Benes, MD How to contact the [facility] Attending or Consulting provider 7A - 7P or covering provider during after hours 7P -7A, for this patient?  1. Check the care team in [facility] and look for a) attending/consulting TRH provider listed and b) the [facility] team listed 2. Log into [email or web address] and use [place]'s universal password to access. If you do not have the password, please contact the hospital operator. 3. Locate the [facility] provider you are looking for under [facility] and page to a number that you can be directly reached. 4. If you still have difficulty reaching the provider, please page the [facility] (Director on Call) for the Hospitalists listed on amion for assistance.  09/20/2020, 11:39 AM

## 2020-09-20 NOTE — Evaluation (Signed)
Physical Therapy Evaluation Patient Details Name: Annette Rogers MRN: [id number] DOB: 1956/07/18 Today's Date: 09/20/2020   History of Present Illness  Annette Rogers is a 65 y.o. female with medical history significant for prior stroke with chronic gastrostomy tube for nutrition and medications, GERD, hyperlipidemia, hyperparathyroidism, hypertension, chronic left-sided hemiplegia from CVA, nonrheumatic mitral valve stenosis status post metallic valve replacement, chronic anticoagulation with warfarin, history of tricuspid valve repair presented to the emergency department complaining of frontal headache that has been present for about 24 hours associated with malaise and nausea and vomiting.  Patient having tolerating tube feedings and reports that she believes she may be constipated.  She denies vision changes.  She denies focal weakness.    Clinical Impression  Patient limited for functional mobility as stated below secondary to BLE weakness, fatigue and poor sitting balance. Patient requires assist for LUE positioning, LLE mobility, and max assist to pull to seated EOB and to become fully upright. Patient able to sit for several minutes with fair/poor sitting balance and requests to return to supine. Patient requires max assist to transition back to supine and for positioning. Patient stated she normally sits in a wheel chair or ambulates a few feet with assistance from family. It is unclear what her function was before admission but patient may be at baseline level of functioning. Patient will benefit from continued physical therapy in hospital and recommended venue below to increase strength, balance, endurance for safe ADLs and mobility.     Follow Up Recommendations Home health PT;Supervision - Intermittent;Supervision for mobility/OOB    Equipment Recommendations  None recommended by PT    Recommendations for Other Services       Precautions / Restrictions Precautions Precautions:  Fall Restrictions Weight Bearing Restrictions: No      Mobility  Bed Mobility Overal bed mobility: Needs Assistance Bed Mobility: Supine to Sit;Sit to Supine     Supine to sit: Mod assist;Max assist Sit to supine: Mod assist;Max assist   General bed mobility comments: Patient requires assist for LUE placement, LLE movement, and to pull to seated EOB with frequent cueing    Transfers                    Ambulation/Gait                Stairs            Wheelchair Mobility    Modified Rankin (Stroke Patients Only)       Balance Overall balance assessment: Needs assistance Sitting-balance support: Single extremity supported Sitting balance-Leahy Scale: Poor Sitting balance - Comments: seated EOB                                     Pertinent Vitals/Pain Pain Assessment: Faces Faces Pain Scale: Hurts even more Pain Location: Left leg Pain Intervention(s): Limited activity within patient's tolerance;Monitored during session;Repositioned    Home Living Family/patient expects to be discharged to:: Private residence Living Arrangements: Children Available Help at Discharge: Family;Available 24 hours/day Type of Home: Apartment Home Access: Level entry     Home Layout: One level Home Equipment: Wheelchair - manual;Hospital bed      Prior Function Level of Independence: Needs assistance   Gait / Transfers Assistance Needed: Patient states she does not ambulate normally but occasionally does with assist for a few steps  ADL's / Homemaking Assistance Needed: family assists  Hand Dominance        Extremity/Trunk Assessment   Upper Extremity Assessment Upper Extremity Assessment: LUE deficits/detail;Generalized weakness LUE Deficits / Details: flacid    Lower Extremity Assessment Lower Extremity Assessment: Generalized weakness;LLE deficits/detail LLE Deficits / Details: flacid       Communication    Communication: Expressive difficulties  Cognition Arousal/Alertness: Awake/alert Behavior During Therapy: WFL for tasks assessed/performed Overall Cognitive Status: Within Functional Limits for tasks assessed                                        General Comments      Exercises     Assessment/Plan    PT Assessment Patient needs continued PT services  PT Problem List Decreased strength;Decreased activity tolerance;Decreased mobility;Decreased balance;Pain       PT Treatment Interventions DME instruction;Balance training;Functional mobility training;Therapeutic activities;Therapeutic exercise;Patient/family education    PT Goals (Current goals can be found in the Care Plan section)  Acute Rehab PT Goals Patient Stated Goal: Return home with family to assist PT Goal Formulation: With patient Time For Goal Achievement: 10/04/20 Potential to Achieve Goals: Fair    Frequency Min 2X/week   Barriers to discharge        Co-evaluation               AM-PAC PT "6 Clicks" Mobility  Outcome Measure Help needed turning from your back to your side while in a flat bed without using bedrails?: A Lot Help needed moving from lying on your back to sitting on the side of a flat bed without using bedrails?: Total Help needed moving to and from a bed to a chair (including a wheelchair)?: Total Help needed standing up from a chair using your arms (e.g., wheelchair or bedside chair)?: Total Help needed to walk in hospital room?: Total Help needed climbing 3-5 steps with a railing? : Total 6 Click Score: 7    End of Session   Activity Tolerance: Patient tolerated treatment well;Patient limited by fatigue Patient left: in bed;with call bell/phone within reach;with bed alarm set Nurse Communication: Mobility status PT Visit Diagnosis: Unsteadiness on feet (R26.81);Other abnormalities of gait and mobility (R26.89);Muscle weakness (generalized) (M62.81);Other symptoms and  signs involving the nervous system (R29.898);Hemiplegia and hemiparesis Hemiplegia - Right/Left: Left    Time: 4174-0814 PT Time Calculation (min) (ACUTE ONLY): 15 min   Charges:   PT Evaluation $PT Eval Low Complexity: 1 Low PT Treatments $Therapeutic Activity: 8-22 mins        9:45 AM, 09/20/20 Wyman Songster PT, DPT Physical Therapist at [facility]

## 2020-09-20 NOTE — Progress Notes (Signed)
ANTICOAGULATION CONSULT NOTE -  Pharmacy Consult for warfarin Indication: atrial fibrillation  Allergies  Allergen Reactions  . Oxycodone Nausea Only    Patient Measurements: Height: 5' (152.4 cm) Weight: 74 kg (163 lb 2.3 oz) IBW/kg (Calculated) : 45.5 Heparin Dosing Weight:   Vital Signs: Temp: 97.6 F (36.4 C) (01/07 0441) BP: 105/55 (01/07 0441) Pulse Rate: 78 (01/07 0441)  Labs: Recent Labs    09/19/20 0511 09/20/20 0637  HGB 9.4* 7.1*  HCT 29.9* 22.7*  PLT 286 333  LABPROT 22.0* 26.0*  INR 2.0* 2.5*  CREATININE 1.45* 0.99    Estimated Creatinine Clearance: 50.2 mL/min (by C-G formula based on SCr of 0.99 mg/dL).   Medical History: Past Medical History:  Diagnosis Date  . COPD (chronic obstructive pulmonary disease) (HCC) 04/23/2020  . Gastrojejunostomy tube status (HCC) 05/31/2020  . GERD (gastroesophageal reflux disease) 07/15/2020  . High cholesterol   . Hypercalcemia 07/15/2020  . Hyperparathyroidism (HCC) 05/31/2020  . Hypertension   . Left hemiplegia (HCC) 04/29/2020  . Non-rheumatic mitral valve stenosis 12/09/2017  . Paroxysmal A-fib (HCC) 04/23/2020  . S/P mitral valve replacement with metallic valve 04/29/2020   Formatting of this note might be different from the original. [employer] regent mechanical  Procedures:   1. Mitral valve replacement (25 mm [employer]) (CPT [id number]) 2. Tricuspid valve repair (annuloplasty only)  (30  mm CE Physio ring) (CPT 33464) 4. 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 Procedures:   1. Mitral valve replacement (25 mm [employer]) (CPT [phone]) 2. Tricuspid valve repair (annuloplasty only)  (30  mm CE Physio ring) (CPT 33464) 4. 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 Stroke [facility])     Medications:  Medications Prior to Admission  Medication Sig Dispense Refill Last Dose  .  acetaminophen (TYLENOL) 325 MG tablet Take 325 mg by mouth every 6 (six) hours as needed for mild pain.   Past Month at Unknown time  . amiodarone (PACERONE) 200 MG tablet Take 200 mg by mouth daily.   Past Month at Unknown time  . aspirin 81 MG EC tablet Take 81 mg by mouth daily.   Past Month at Unknown time  . atorvastatin (LIPITOR) 40 MG tablet Take 40 mg by mouth daily.   Past Month at Unknown time  . carvedilol (COREG) 3.125 MG tablet Take 1 tablet (3.125 mg total) by mouth 2 (two) times daily with a meal. 60 tablet 1 Past Week at Unknown time  . Cholecalciferol 50 MCG (2000 UT) TABS Take by mouth.   Past Week at Unknown time  . cinacalcet (SENSIPAR) 90 MG tablet Take 90 mg by mouth daily.   Past Week at Unknown time  . diclofenac Sodium (VOLTAREN) 1 % GEL Apply topically.   Past Month at Unknown time  . mirtazapine (REMERON) 30 MG tablet Take 30 mg by mouth at bedtime.   Past Week at Unknown time  . Multiple Vitamin (MULTIVITAMIN WITH MINERALS) TABS tablet Place 1 tablet into feeding tube daily.   Past Week at Unknown time  . pantoprazole (PROTONIX) 40 MG tablet Take 40 mg by mouth daily.   Past Month at Unknown time  . potassium & sodium phosphates (PHOS-NAK) 280-160-250 MG PACK Take 1 packet by mouth 4 (four) times daily -  with meals and at bedtime. 120 each 0 Past Week at Unknown time  .  senna-docusate (SENOKOT-S) 8.6-50 MG tablet Take 1 tablet by mouth 2 (two) times daily. 60 tablet 1 Past Month at Unknown time  . sodium bicarbonate 650 MG tablet Take 1 tablet (650 mg total) by mouth 2 (two) times daily. 60 tablet 1 Past Month at Unknown time  . traZODone (DESYREL) 50 MG tablet Take 50 mg by mouth daily.   Past Week at Unknown time  . warfarin (COUMADIN) 2 MG tablet Take 2 mg by mouth daily.   09/17/2020  . cyclobenzaprine (FLEXERIL) 5 MG tablet Take 1 tablet (5 mg total) by mouth 3 (three) times daily as needed. For muscle spasms.  May cause drowsiness 15 tablet 0   . Nutritional  Supplements (FEEDING SUPPLEMENT, OSMOLITE 1.5 CAL,) LIQD Place 1,000 mLs into feeding tube continuous. 1000 mL 10     Assessment: Pharmacy consulted to dose warfarin in patient with atrial fibrillation.  INR on admission therapeutic at 2.0. Home dose listed as 2 mg daily.  Patient on amiodarone PTA.  hgb 9.4> 7.2- chronic anemia hemoccult stool ordered.  Transfuse for Hg<7.   Goal of Therapy:  INR 2-3 Monitor platelets by anticoagulation protocol: Yes   Plan:  Warfarin 1 mg x 1 dose. Monitor daily INR and s/s of bleeding.  Judeth Cornfield, PharmD Clinical Pharmacist 09/20/2020 2:06 PM

## 2020-09-21 DIAGNOSIS — E878 Other disorders of electrolyte and fluid balance, not elsewhere classified: Secondary | ICD-10-CM | POA: Diagnosis not present

## 2020-09-21 DIAGNOSIS — D649 Anemia, unspecified: Secondary | ICD-10-CM | POA: Diagnosis present

## 2020-09-21 LAB — PREPARE RBC (CROSSMATCH)

## 2020-09-21 LAB — COMPREHENSIVE METABOLIC PANEL
ALT: 46 U/L — ABNORMAL HIGH (ref 0–44)
AST: 97 U/L — ABNORMAL HIGH (ref 15–41)
Albumin: 1.7 g/dL — ABNORMAL LOW (ref 3.5–5.0)
Alkaline Phosphatase: 111 U/L (ref 38–126)
Anion gap: 7 (ref 5–15)
BUN: 27 mg/dL — ABNORMAL HIGH (ref 8–23)
CO2: 21 mmol/L — ABNORMAL LOW (ref 22–32)
Calcium: 8.6 mg/dL — ABNORMAL LOW (ref 8.9–10.3)
Chloride: 106 mmol/L (ref 98–111)
Creatinine, Ser: 1 mg/dL (ref 0.44–1.00)
GFR, Estimated: >60 mL/min (ref >60)
Glucose, Bld: 257 mg/dL — ABNORMAL HIGH (ref 70–99)
Potassium: 3 mmol/L — ABNORMAL LOW (ref 3.5–5.1)
Sodium: 134 mmol/L — ABNORMAL LOW (ref 135–145)
Total Bilirubin: 0.8 mg/dL (ref 0.3–1.2)
Total Protein: 5.2 g/dL — ABNORMAL LOW (ref 6.5–8.1)

## 2020-09-21 LAB — GLUCOSE, CAPILLARY
Glucose-Capillary: 120 mg/dL — ABNORMAL HIGH (ref 70–99)
Glucose-Capillary: 195 mg/dL — ABNORMAL HIGH (ref 70–99)
Glucose-Capillary: 214 mg/dL — ABNORMAL HIGH (ref 70–99)
Glucose-Capillary: 220 mg/dL — ABNORMAL HIGH (ref 70–99)
Glucose-Capillary: 161 mg/dL — ABNORMAL HIGH (ref 70–99)

## 2020-09-21 LAB — PROTIME-INR
INR: 2.9 — ABNORMAL HIGH (ref 0.8–1.2)
Prothrombin Time: 29.3 seconds — ABNORMAL HIGH (ref 11.4–15.2)

## 2020-09-21 LAB — CBC
HCT: 21.8 % — ABNORMAL LOW (ref 36.0–46.0)
Hemoglobin: 7.1 g/dL — ABNORMAL LOW (ref 12.0–15.0)
MCH: 24.9 pg — ABNORMAL LOW (ref 26.0–34.0)
MCHC: 32.6 g/dL (ref 30.0–36.0)
MCV: 76.5 fL — ABNORMAL LOW (ref 80.0–100.0)
Platelets: 340 10*3/uL (ref 150–400)
RBC: 2.85 MIL/uL — ABNORMAL LOW (ref 3.87–5.11)
RDW: 20.4 % — ABNORMAL HIGH (ref 11.5–15.5)
WBC: 7.8 10*3/uL (ref 4.0–10.5)
nRBC: 0.3 % — ABNORMAL HIGH (ref 0.0–0.2)

## 2020-09-21 LAB — URINE CULTURE

## 2020-09-21 LAB — PHOSPHORUS: Phosphorus: <1 mg/dL — CL (ref 2.5–4.6)

## 2020-09-21 LAB — MAGNESIUM: Magnesium: 2.3 mg/dL (ref 1.7–2.4)

## 2020-09-21 LAB — ABO/RH: ABO/RH(D): O POS

## 2020-09-21 MED ORDER — WARFARIN 0.5 MG HALF TABLET
0.5000 mg | ORAL_TABLET | Freq: Once | ORAL | Status: AC
  Administered 2020-09-21: 0.5 mg via ORAL
  Filled 2020-09-21: qty 1

## 2020-09-21 MED ORDER — PANTOPRAZOLE SODIUM 40 MG PO PACK
40.0000 mg | PACK | Freq: Every day | ORAL | Status: DC
  Administered 2020-09-21 – 2020-09-23 (×3): 40 mg
  Filled 2020-09-21 (×3): qty 20

## 2020-09-21 MED ORDER — POTASSIUM CHLORIDE 10 MEQ/100ML IV SOLN
10.0000 meq | INTRAVENOUS | Status: AC
  Administered 2020-09-21 (×4): 10 meq via INTRAVENOUS
  Filled 2020-09-21 (×4): qty 100

## 2020-09-21 MED ORDER — POTASSIUM CHLORIDE IN NACL 20-0.9 MEQ/L-% IV SOLN: INTRAVENOUS | Status: DC

## 2020-09-21 MED ORDER — SODIUM CHLORIDE 0.9% IV SOLUTION: Freq: Once | INTRAVENOUS | Status: AC

## 2020-09-21 MED ORDER — POTASSIUM PHOSPHATES 15 MMOLE/5ML IV SOLN
30.0000 mmol | Freq: Once | INTRAVENOUS | Status: AC
  Administered 2020-09-21: 30 mmol via INTRAVENOUS
  Filled 2020-09-21: qty 10

## 2020-09-21 MED ORDER — INSULIN ASPART 100 UNIT/ML ~~LOC~~ SOLN
0.0000 [IU] | Freq: Three times a day (TID) | SUBCUTANEOUS | Status: DC
  Administered 2020-09-21: 3 [IU] via SUBCUTANEOUS
  Administered 2020-09-22 – 2020-09-23 (×3): 1 [IU] via SUBCUTANEOUS

## 2020-09-21 MED ORDER — WARFARIN SODIUM 1 MG PO TABS: 1.0000 mg | ORAL_TABLET | Freq: Once | ORAL | Status: DC

## 2020-09-21 NOTE — Progress Notes (Addendum)
ANTICOAGULATION CONSULT NOTE -  Pharmacy Consult for warfarin Indication: atrial fibrillation  Allergies  Allergen Reactions  . Oxycodone Nausea Only    Patient Measurements: Height: 5' (152.4 cm) Weight: 74 kg (163 lb 2.3 oz) IBW/kg (Calculated) : 45.5 Heparin Dosing Weight:   Vital Signs: Temp: 98.2 F (36.8 C) (01/08 0359) BP: 133/85 (01/08 0359) Pulse Rate: 87 (01/08 0359)  Labs: Recent Labs    09/19/20 0511 09/20/20 0637  HGB 9.4* 7.1*  HCT 29.9* 22.7*  PLT 286 333  LABPROT 22.0* 26.0*  INR 2.0* 2.5*  CREATININE 1.45* 0.99    Estimated Creatinine Clearance: 50.2 mL/min (by C-G formula based on SCr of 0.99 mg/dL).   Medical History: Past Medical History:  Diagnosis Date  . COPD (chronic obstructive pulmonary disease) (HCC) 04/23/2020  . Gastrojejunostomy tube status (HCC) 05/31/2020  . GERD (gastroesophageal reflux disease) 07/15/2020  . High cholesterol   . Hypercalcemia 07/15/2020  . Hyperparathyroidism (HCC) 05/31/2020  . Hypertension   . Left hemiplegia (HCC) 04/29/2020  . Non-rheumatic mitral valve stenosis 12/09/2017  . Paroxysmal A-fib (HCC) 04/23/2020  . S/P mitral valve replacement with metallic valve 04/29/2020   Formatting of this note might be different from the original. [employer] Jude regent mechanical  Procedures:   1. Mitral valve replacement (25 mm [employer]) (CPT [id number]) 2. Tricuspid valve repair (annuloplasty only)  (30  mm CE Physio ring) (CPT 33464) 4. 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 Procedures:   1. Mitral valve replacement (25 mm [employer]) (CPT [phone]) 2. Tricuspid valve repair (annuloplasty only)  (30  mm CE Physio ring) (CPT 33464) 4. 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 Stroke [facility])     Medications:  Medications Prior to Admission  Medication Sig Dispense Refill Last Dose  .  acetaminophen (TYLENOL) 325 MG tablet Take 325 mg by mouth every 6 (six) hours as needed for mild pain.   Past Month at Unknown time  . amiodarone (PACERONE) 200 MG tablet Take 200 mg by mouth daily.   Past Month at Unknown time  . aspirin 81 MG EC tablet Take 81 mg by mouth daily.   Past Month at Unknown time  . atorvastatin (LIPITOR) 40 MG tablet Take 40 mg by mouth daily.   Past Month at Unknown time  . carvedilol (COREG) 3.125 MG tablet Take 1 tablet (3.125 mg total) by mouth 2 (two) times daily with a meal. 60 tablet 1 Past Week at Unknown time  . Cholecalciferol 50 MCG (2000 UT) TABS Take by mouth.   Past Week at Unknown time  . cinacalcet (SENSIPAR) 90 MG tablet Take 90 mg by mouth daily.   Past Week at Unknown time  . diclofenac Sodium (VOLTAREN) 1 % GEL Apply topically.   Past Month at Unknown time  . mirtazapine (REMERON) 30 MG tablet Take 30 mg by mouth at bedtime.   Past Week at Unknown time  . Multiple Vitamin (MULTIVITAMIN WITH MINERALS) TABS tablet Place 1 tablet into feeding tube daily.   Past Week at Unknown time  . pantoprazole (PROTONIX) 40 MG tablet Take 40 mg by mouth daily.   Past Month at Unknown time  . potassium & sodium phosphates (PHOS-NAK) 280-160-250 MG PACK Take 1 packet by mouth 4 (four) times daily -  with meals and at bedtime. 120 each 0 Past Week at Unknown time  .  senna-docusate (SENOKOT-S) 8.6-50 MG tablet Take 1 tablet by mouth 2 (two) times daily. 60 tablet 1 Past Month at Unknown time  . sodium bicarbonate 650 MG tablet Take 1 tablet (650 mg total) by mouth 2 (two) times daily. 60 tablet 1 Past Month at Unknown time  . traZODone (DESYREL) 50 MG tablet Take 50 mg by mouth daily.   Past Week at Unknown time  . warfarin (COUMADIN) 2 MG tablet Take 2 mg by mouth daily.   09/17/2020  . cyclobenzaprine (FLEXERIL) 5 MG tablet Take 1 tablet (5 mg total) by mouth 3 (three) times daily as needed. For muscle spasms.  May cause drowsiness 15 tablet 0   . Nutritional  Supplements (FEEDING SUPPLEMENT, OSMOLITE 1.5 CAL,) LIQD Place 1,000 mLs into feeding tube continuous. 1000 mL 10     Assessment: Pharmacy consulted to dose warfarin in patient with atrial fibrillation.  INR on admission therapeutic at 2.0. Home dose listed as 2 mg daily.  Patient on amiodarone PTA.  hgb 9.4> 7.2- chronic anemia hemoccult stool ordered.  Transfuse for Hg<7.   1/8 INR resulted 2.9   Goal of Therapy:  INR 2-3 Monitor platelets by anticoagulation protocol: Yes   Plan:  Warfarin 0.5 mg x 1 dose. Monitor daily INR and s/s of bleeding.  Luan Pulling, PharmD, MBA, BCGP Clinical Pharmacist  09/21/2020 9:56 AM

## 2020-09-21 NOTE — Progress Notes (Signed)
PROGRESS NOTE   Annette Rogers  JJH:[id number] DOB: 25-Feb-1956 DOA: 09/19/2020 PCP: Oneal Grout, FNP   Chief Complaint  Patient presents with  . Migraine    Brief Admission History:  65 y.o. female with medical history significant for prior stroke with chronic gastrostomy tube for nutrition and medications, GERD, hyperlipidemia, hyperparathyroidism, hypertension, chronic left-sided hemiplegia from CVA, nonrheumatic mitral valve stenosis status post metallic valve replacement, chronic anticoagulation with warfarin, history of tricuspid valve repair presented to the emergency department complaining of frontal headache that has been present for about 24 hours associated with malaise and nausea and vomiting.  Patient having tolerating tube feedings and reports that she believes she may be constipated.  She denies vision changes.  She denies focal weakness.  ED Course: Patient was evaluated in the ED and noted to have a potassium of 2.5 BUN 40, creatinine 1.45, anion gap 16, phosphorus 2.1, hemoglobin 9.4, hematocrit 29.9, platelet count 286.  Patient had an INR of 2.0.  Glucose 100.  SARS 2 coronavirus testing negative.  Influenza A and influenza B testing negative.  CT brain no findings of acute infarct.  Abdominal radiographs negative.  Patient was hypotensive on arrival.  Patient was a difficult IV stick and had to have a central line placed for vascular access.  IV fluids started electrolyte replacement started and admission requested for further management.  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 Acute renal failure-RESOLVED.  Secondary to dehydration with poor free water intake and nausea and vomiting.  Treated with IV fluids.  She was bolused in the emergency department with improvement in blood pressure.  2. Hypotension-improved with rehydration, still holding oral antihypertensives.  Follow.  3. Symptomatic Anemia of chronic disease - hemoccult stool.  Transfuse 1 units PRBC.   4. Hypokalemia-severe and being treated with IV potassium supplementation.  Follow closely. 5. Hypomagnesemia - IV replacement given.  6. Refeeding syndrome - marked drop in phos, IV phos replacement, monitor lytes closely and replace as needed.  7. Hypophosphatemia - severe - IV replacement ordered.  8. Warfarin anticoagulation- consult to pharmacy for warfarin management in the hospital.  Daily PT/INR testing. 9. Paroxysmal atrial fibrillation- fully anticoagulated with warfarin, resume home beta-blocker therapy and amiodarone when medications have been reconciled. 10. Hyperparathyroidism-monitor electrolytes closely. Outpatient follow up.  11. COPD-stable. 12. GERD- Protonix for GI protection. 13. Headache-resolved now.  Likely was secondary to severe dehydration, hypotension and electrolyte abnormalities, CT head with no acute findings.  Pain management as ordered.  Follow. 14. Constipation-added MiraLAX daily AND dulcolax suppository.  DVT prophylaxis: Warfarin Code Status: Full Family Communication: Patient updated with plan of care at bedside Disposition Plan: Home with HHPT, RN Consults called: PT Admission status: INP  Status is: Inpatient  Remains inpatient appropriate because:IV treatments appropriate due to intensity  of illness or  inability to take PO and Inpatient level of care appropriate due to severity of illness  Dispo: The patient is from: Home              Anticipated d/c is to: Home              Anticipated d/c date is: 1 day                Patient currently is not medically stable to d/c.  Consultants:   n/a  Procedures:   n/a  Antimicrobials:  Fosfomycin 09/20/20   Subjective: Pt remains weak today.  Tube feed not at goal yet.   Objective: Vitals:   09/20/20 1536 09/20/20 2009 09/21/20 0359 09/21/20 1409  BP: (!) 90/54 92/67 133/85 (!) 116/55  Pulse: 79 83 87 87  Resp: 18 19 18 18   Temp: 97.6 F (36.4 C) 97.8 F (36.6 C) 98.2 F (36.8 C) (!) 97 F (36.1 C)  TempSrc: Oral     SpO2:  98% 100% 100%  Weight:      Height:        Intake/Output Summary (Last 24 hours) at 09/21/2020 1636 Last data filed at 09/21/2020 11/19/2020 Gross per 24 hour  Intake 649.82 ml  Output --  Net 649.82 ml   Filed Weights   09/19/20 0306  Weight: 74 kg    Examination:  General exam: chronically ill and emaciated appearing, awake, and alert, Appears calm and comfortable  Respiratory system: Clear to auscultation. Respiratory effort normal. Cardiovascular system: normal S1 & S2 heard. No JVD, murmurs, rubs, gallops or clicks. No pedal edema. Gastrointestinal system: G-Tube site appears well.  Abdomen is nondistended, soft and nontender. No organomegaly or masses felt. Normal bowel sounds heard. Central nervous system: Alert and oriented. No focal neurological deficits. Extremities: Symmetric 5 x 5 power. Skin: No rashes, lesions or ulcers Psychiatry: Judgement and insight appear normal. Mood & affect appropriate.   Data Reviewed: I have personally reviewed following labs and imaging studies  CBC: Recent Labs  Lab 09/19/20 0511 09/20/20 0637 09/21/20 0500  WBC 9.0 6.6 7.8  NEUTROABS 6.4  --   --   HGB 9.4* 7.1* 7.1*  HCT 29.9* 22.7* 21.8*  MCV 77.1* 77.2* 76.5*  PLT 286 333 340     Basic Metabolic Panel: Recent Labs  Lab 09/19/20 0511 09/20/20 0637 09/21/20 0500  NA 137 138 134*  K 2.5* 2.9* 3.0*  CL 91* 106 106  CO2 30 26 21*  GLUCOSE 100* 143* 257*  BUN 40* 33* 27*  CREATININE 1.45* 0.99 1.00  CALCIUM 10.0 8.3* 8.6*  MG 1.9 1.5* 2.3  PHOS 2.1*  --  <1.0*    GFR: Estimated Creatinine Clearance: 49.7 mL/min (by C-G formula based on SCr of 1 mg/dL).  Liver Function Tests: Recent Labs  Lab 09/20/20 0637 09/21/20 0500  AST 67* 97*  ALT 37 46*  ALKPHOS 83 111  BILITOT 1.1 0.8  PROT 5.3* 5.2*  ALBUMIN 1.8* 1.7*    CBG: Recent Labs  Lab 09/20/20 1845 09/20/20 2038 09/21/20 0101 09/21/20 0838 09/21/20 1113  GLUCAP 119* 110* 120* 195* 214*    Recent Results (from the past 240 hour(s))  Resp Panel by RT-PCR (Flu A&B, Covid) Nasopharyngeal Swab     Status: None   Collection Time: 09/19/20  7:05 AM   Specimen: Nasopharyngeal Swab; Nasopharyngeal(NP) swabs in vial transport medium  Result Value Ref Range Status   SARS Coronavirus 2 by RT PCR NEGATIVE NEGATIVE Final  Comment: (NOTE) SARS-CoV-2 target nucleic acids are NOT DETECTED.  The SARS-CoV-2 RNA is generally detectable in upper respiratory specimens during the acute phase of infection. The lowest concentration of SARS-CoV-2 viral copies this assay can detect is 138 copies/mL. A negative result does not preclude SARS-Cov-2 infection and should not be used as the sole basis for treatment or other patient management decisions. A negative result may occur with  improper specimen collection/handling, submission of specimen other than nasopharyngeal swab, presence of viral mutation(s) within the areas targeted by this assay, and inadequate number of viral copies(<138 copies/mL). A negative result must be combined with clinical observations, patient history, and epidemiological information. The expected result is Negative.  Fact Sheet for Patients:   BloggerCourse.com  Fact Sheet for Healthcare Providers:  SeriousBroker.it  This test is no t yet approved or cleared by the [place] FDA and  has been authorized for detection and/or diagnosis of SARS-CoV-2 by FDA under an Emergency Use Authorization (EUA). This EUA will remain  in effect (meaning this test can be used) for the duration of the COVID-19 declaration under Section 564(b)(1) of the Act, 21 U.S.C.section 360bbb-3(b)(1), unless the authorization is terminated  or revoked sooner.       Influenza A by PCR NEGATIVE NEGATIVE Final   Influenza B by PCR NEGATIVE NEGATIVE Final    Comment: (NOTE) The Xpert Xpress SARS-CoV-2/FLU/RSV plus assay is intended as an aid in the diagnosis of influenza from Nasopharyngeal swab specimens and should not be used as a sole basis for treatment. Nasal washings and aspirates are unacceptable for Xpert Xpress SARS-CoV-2/FLU/RSV testing.  Fact Sheet for Patients: BloggerCourse.com  Fact Sheet for Healthcare Providers: SeriousBroker.it  This test is not yet approved or cleared by the [place] FDA and has been authorized for detection and/or diagnosis of SARS-CoV-2 by FDA under an Emergency Use Authorization (EUA). This EUA will remain in effect (meaning this test can be used) for the duration of the COVID-19 declaration under Section 564(b)(1) of the Act, 21 U.S.C. section 360bbb-3(b)(1), unless the authorization is terminated or revoked.  Performed at [facility], [address], [place], [place] [address]   Urine culture     Status: Abnormal   Collection Time: 09/19/20  7:10 AM   Specimen: Urine, Random  Result Value Ref Range Status   Specimen Description   Final    URINE, RANDOM Performed at [facility], [address] [place], [place] [address]    Special Requests   Final    NONE Performed at [facility], [address], [place], [place] [address]    Culture MULTIPLE SPECIES PRESENT, SUGGEST RECOLLECTION (A)  Final   Report Status 09/21/2020 FINAL  Final    Radiology Studies: No results found. Scheduled Meds: . sodium chloride   Intravenous Once  . Chlorhexidine Gluconate Cloth  6 each Topical Daily  . insulin aspart  0-9 Units Subcutaneous TID WC  . polyethylene glycol  17 g Per Tube Daily  . warfarin  0.5 mg Oral ONCE-1600  . Warfarin - Pharmacist Dosing Inpatient   Does not apply q1600   Continuous Infusions: . feeding supplement (OSMOLITE 1.5 CAL)    . potassium chloride 10 mEq (09/21/20 1559)  . potassium PHOSPHATE IVPB (in mmol) 30 mmol (09/21/20 1451)    LOS: 2 days   Time spent: 35 mins  Hammad Finkler Laural Benes, MD How to contact the [facility] Attending or Consulting provider 7A - 7P or covering provider during after hours 7P -7A, for  this patient?  1. Check the care team in [facility] and look for a) attending/consulting [facility] provider listed and b) the [facility] team listed 2. Log into [email or web address] and use [place]'s universal password to access. If you do not have the password, please contact the hospital operator. 3. Locate the [facility] provider you are looking for under [facility] and page to a number that you can be directly reached. 4. If you still have difficulty reaching the provider, please page the [facility] (Director on Call) for the Hospitalists listed on amion for assistance.  09/21/2020, 4:36 PM

## 2020-09-22 LAB — CBC
HCT: 26.6 % — ABNORMAL LOW (ref 36.0–46.0)
Hemoglobin: 8.8 g/dL — ABNORMAL LOW (ref 12.0–15.0)
MCH: 25.6 pg — ABNORMAL LOW (ref 26.0–34.0)
MCHC: 33.1 g/dL (ref 30.0–36.0)
MCV: 77.3 fL — ABNORMAL LOW (ref 80.0–100.0)
Platelets: 290 10*3/uL (ref 150–400)
RBC: 3.44 MIL/uL — ABNORMAL LOW (ref 3.87–5.11)
RDW: 20.5 % — ABNORMAL HIGH (ref 11.5–15.5)
WBC: 9 10*3/uL (ref 4.0–10.5)
nRBC: 0 % (ref 0.0–0.2)

## 2020-09-22 LAB — BASIC METABOLIC PANEL
Anion gap: 7 (ref 5–15)
BUN: 24 mg/dL — ABNORMAL HIGH (ref 8–23)
CO2: 21 mmol/L — ABNORMAL LOW (ref 22–32)
Calcium: 7.9 mg/dL — ABNORMAL LOW (ref 8.9–10.3)
Chloride: 109 mmol/L (ref 98–111)
Creatinine, Ser: 0.87 mg/dL (ref 0.44–1.00)
GFR, Estimated: >60 mL/min (ref >60)
Glucose, Bld: 139 mg/dL — ABNORMAL HIGH (ref 70–99)
Potassium: 4.4 mmol/L (ref 3.5–5.1)
Sodium: 137 mmol/L (ref 135–145)

## 2020-09-22 LAB — PROTIME-INR
INR: 2.4 — ABNORMAL HIGH (ref 0.8–1.2)
Prothrombin Time: 25.2 seconds — ABNORMAL HIGH (ref 11.4–15.2)

## 2020-09-22 LAB — TYPE AND SCREEN
ABO/RH(D): O POS
Antibody Screen: NEGATIVE
Unit division: 0

## 2020-09-22 LAB — GLUCOSE, CAPILLARY
Glucose-Capillary: 118 mg/dL — ABNORMAL HIGH (ref 70–99)
Glucose-Capillary: 118 mg/dL — ABNORMAL HIGH (ref 70–99)
Glucose-Capillary: 120 mg/dL — ABNORMAL HIGH (ref 70–99)
Glucose-Capillary: 124 mg/dL — ABNORMAL HIGH (ref 70–99)
Glucose-Capillary: 87 mg/dL (ref 70–99)
Glucose-Capillary: 104 mg/dL — ABNORMAL HIGH (ref 70–99)
Glucose-Capillary: 126 mg/dL — ABNORMAL HIGH (ref 70–99)

## 2020-09-22 LAB — PHOSPHORUS: Phosphorus: 0.9 mg/dL — CL (ref 2.5–4.6)

## 2020-09-22 LAB — BPAM RBC
Blood Product Expiration Date: 202202112359
ISSUE DATE / TIME: 202201081808
Unit Type and Rh: 5100

## 2020-09-22 LAB — MAGNESIUM: Magnesium: 1.8 mg/dL (ref 1.7–2.4)

## 2020-09-22 MED ORDER — WARFARIN 0.5 MG HALF TABLET
0.5000 mg | ORAL_TABLET | Freq: Once | ORAL | Status: DC
  Filled 2020-09-22: qty 1

## 2020-09-22 NOTE — Progress Notes (Addendum)
ANTICOAGULATION CONSULT NOTE -  Pharmacy Consult for warfarin Indication: atrial fibrillation  Allergies  Allergen Reactions  . Oxycodone Nausea Only    Patient Measurements: Height: 5' (152.4 cm) Weight: 67.3 kg (148 lb 5.9 oz) IBW/kg (Calculated) : 45.5 Heparin Dosing Weight:   Vital Signs: Temp: 98.2 F (36.8 C) (01/09 0623) Temp Source: Oral (01/09 0623) BP: 97/61 (01/09 0623) Pulse Rate: 97 (01/09 0623)  Labs: Recent Labs    09/20/20 0637 09/21/20 0500  HGB 7.1* 7.1*  HCT 22.7* 21.8*  PLT 333 340  LABPROT 26.0* 29.3*  INR 2.5* 2.9*  CREATININE 0.99 1.00    Estimated Creatinine Clearance: 47.3 mL/min (by C-G formula based on SCr of 1 mg/dL).   Medical History: Past Medical History:  Diagnosis Date  . COPD (chronic obstructive pulmonary disease) (HCC) 04/23/2020  . Gastrojejunostomy tube status (HCC) 05/31/2020  . GERD (gastroesophageal reflux disease) 07/15/2020  . High cholesterol   . Hypercalcemia 07/15/2020  . Hyperparathyroidism (HCC) 05/31/2020  . Hypertension   . Left hemiplegia (HCC) 04/29/2020  . Non-rheumatic mitral valve stenosis 12/09/2017  . Paroxysmal A-fib (HCC) 04/23/2020  . S/P mitral valve replacement with metallic valve 04/29/2020   Formatting of this note might be different from the original. [employer] regent mechanical  Procedures:   1. Mitral valve replacement (25 mm [employer]ent) (CPT [phone]) 2. Tricuspid valve repair (annuloplasty only)  (30  mm CE Physio ring) (CPT 33464) 4. 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 Procedures:   1. Mitral valve replacement (25 mm [employer] Regent) (CPT [phone]) 2. Tricuspid valve repair (annuloplasty only)  (30  mm CE Physio ring) (CPT 33464) 4. 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 Stroke [facility])     Medications:  Medications Prior to Admission  Medication Sig Dispense  Refill Last Dose  . acetaminophen (TYLENOL) 325 MG tablet Take 325 mg by mouth every 6 (six) hours as needed for mild pain.   Past Month at Unknown time  . amiodarone (PACERONE) 200 MG tablet Take 200 mg by mouth daily.   Past Month at Unknown time  . aspirin 81 MG EC tablet Take 81 mg by mouth daily.   Past Month at Unknown time  . atorvastatin (LIPITOR) 40 MG tablet Take 40 mg by mouth daily.   Past Month at Unknown time  . carvedilol (COREG) 3.125 MG tablet Take 1 tablet (3.125 mg total) by mouth 2 (two) times daily with a meal. 60 tablet 1 Past Week at Unknown time  . Cholecalciferol 50 MCG (2000 UT) TABS Take by mouth.   Past Week at Unknown time  . cinacalcet (SENSIPAR) 90 MG tablet Take 90 mg by mouth daily.   Past Week at Unknown time  . diclofenac Sodium (VOLTAREN) 1 % GEL Apply topically.   Past Month at Unknown time  . mirtazapine (REMERON) 30 MG tablet Take 30 mg by mouth at bedtime.   Past Week at Unknown time  . Multiple Vitamin (MULTIVITAMIN WITH MINERALS) TABS tablet Place 1 tablet into feeding tube daily.   Past Week at Unknown time  . pantoprazole (PROTONIX) 40 MG tablet Take 40 mg by mouth daily.   Past Month at Unknown time  . potassium & sodium phosphates (PHOS-NAK) 280-160-250 MG PACK Take 1 packet by mouth 4 (four) times daily -  with meals and at bedtime. 120 each 0 Past  Week at Unknown time  . senna-docusate (SENOKOT-S) 8.6-50 MG tablet Take 1 tablet by mouth 2 (two) times daily. 60 tablet 1 Past Month at Unknown time  . sodium bicarbonate 650 MG tablet Take 1 tablet (650 mg total) by mouth 2 (two) times daily. 60 tablet 1 Past Month at Unknown time  . traZODone (DESYREL) 50 MG tablet Take 50 mg by mouth daily.   Past Week at Unknown time  . warfarin (COUMADIN) 2 MG tablet Take 2 mg by mouth daily.   09/17/2020  . cyclobenzaprine (FLEXERIL) 5 MG tablet Take 1 tablet (5 mg total) by mouth 3 (three) times daily as needed. For muscle spasms.  May cause drowsiness 15 tablet 0   .  Nutritional Supplements (FEEDING SUPPLEMENT, OSMOLITE 1.5 CAL,) LIQD Place 1,000 mLs into feeding tube continuous. 1000 mL 10     Assessment: Pharmacy consulted to dose warfarin in patient with atrial fibrillation.  INR on admission therapeutic at 2.0. Home dose listed as 2 mg daily.  Patient on amiodarone PTA.  hgb 9.4> 7.2- chronic anemia hemoccult stool ordered.  Transfuse for Hg<7.   1/8 INR resulted 2.9 1/9 No lab available   Goal of Therapy:  INR 2-3 Monitor platelets by anticoagulation protocol: Yes   Plan:  Hold warfarin x 1 dose due to upward trend and last INR 2.9 Monitor daily INR and s/s of bleeding.  Luan Pulling, PharmD, MBA, BCGP Clinical Pharmacist  09/22/2020 1:24 PM

## 2020-09-22 NOTE — Progress Notes (Signed)
PROGRESS NOTE   Annette Rogers  OPF:[id number] DOB: Aug 03, 1956 DOA: 09/19/2020 PCP: Oneal Grout, FNP   Chief Complaint  Patient presents with  . Migraine    Brief Admission History:  65 y.o. female with medical history significant for prior stroke with chronic gastrostomy tube for nutrition and medications, GERD, hyperlipidemia, hyperparathyroidism, hypertension, chronic left-sided hemiplegia from CVA, nonrheumatic mitral valve stenosis status post metallic valve replacement, chronic anticoagulation with warfarin, history of tricuspid valve repair presented to the emergency department complaining of frontal headache that has been present for about 24 hours associated with malaise and nausea and vomiting.  Patient having tolerating tube feedings and reports that she believes she may be constipated.  She denies vision changes.  She denies focal weakness.  ED Course: Patient was evaluated in the ED and noted to have a potassium of 2.5 BUN 40, creatinine 1.45, anion gap 16, phosphorus 2.1, hemoglobin 9.4, hematocrit 29.9, platelet count 286.  Patient had an INR of 2.0.  Glucose 100.  SARS 2 coronavirus testing negative.  Influenza A and influenza B testing negative.  CT brain no findings of acute infarct.  Abdominal radiographs negative.  Patient was hypotensive on arrival.  Patient was a difficult IV stick and had to have a central line placed for vascular access.  IV fluids started electrolyte replacement started and admission requested for further management.  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 Acute renal failure-RESOLVED.  Secondary to dehydration with poor free water intake and nausea and vomiting.  Treated with IV fluids.  She was bolused in the emergency department with improvement in blood pressure.  2. Hypotension-improved with rehydration, still holding oral antihypertensives.  Follow.  3. Symptomatic Anemia of chronic disease - hemoccult stool.  Transfuse 1 units PRBC.  Waiting on morning labs.  4. Hypokalemia-severe and being treated with IV potassium supplementation.  Follow closely. Still waiting on morning labs.  5. Hypomagnesemia - IV replacement given.  6. Refeeding syndrome - marked drop in phos, IV phos replacement, monitor lytes closely and replace as needed.  7. Hypophosphatemia - severe - IV replacement ordered.  8. Warfarin anticoagulation- consult to pharmacy for warfarin management in the hospital.  Daily PT/INR testing. 9. Paroxysmal atrial fibrillation- fully anticoagulated with warfarin, resume home beta-blocker therapy and amiodarone when medications have been reconciled. 10. Hyperparathyroidism-monitor electrolytes closely. Outpatient follow up.  11. COPD-stable. 12. GERD- Protonix for GI protection. 13. Headache-resolved now.  Likely was secondary to severe dehydration, hypotension and electrolyte abnormalities, CT head with no acute findings.  Pain management as ordered.  Follow. 14. Constipation-added MiraLAX daily AND dulcolax suppository with results.  DVT prophylaxis: Warfarin Code Status: Full Family Communication: Patient updated with plan of care at bedside Disposition Plan: Home with HHPT, RN Consults called: PT Admission status: INP  Status is: Inpatient  Remains  inpatient appropriate because:IV treatments appropriate due to intensity of illness or inability to take PO and Inpatient level of care appropriate due to severity of illness  Dispo: The patient is from: Home              Anticipated d/c is to: Home              Anticipated d/c date is: 1 day                 Patient currently is not medically stable to d/c.  Still waiting on her labs to be done.   Consultants:   n/a  Procedures:   n/a  Antimicrobials:  Fosfomycin 09/20/20   Subjective: Pt tired and thirsty but otherwise had a bowel movement yesterday.   Objective: Vitals:   09/21/20 2030 09/21/20 2107 09/21/20 2126 09/22/20 0623  BP:  (!) 104/53 (!) 100/55 97/61  Pulse:  87 90 97  Resp:  18 16 18   Temp:  97.7 F (36.5 C) 98.7 F (37.1 C) 98.2 F (36.8 C)  TempSrc:  Oral Oral Oral  SpO2: 100%   99%  Weight:    67.3 kg  Height:        Intake/Output Summary (Last 24 hours) at 09/22/2020 1314 Last data filed at 09/21/2020 2125 Gross per 24 hour  Intake 890 ml  Output -  Net 890 ml   Filed Weights   09/19/20 0306 09/22/20 0623  Weight: 74 kg 67.3 kg    Examination:  General exam: chronically ill and emaciated appearing, awake, and alert, Appears calm and comfortable  Respiratory system: Clear to auscultation. Respiratory effort normal. Cardiovascular system: normal S1 & S2 heard. No JVD, murmurs, rubs, gallops or clicks. No pedal edema. Gastrointestinal system: G-Tube site appears well.  Abdomen is nondistended, soft and nontender. No organomegaly or masses felt. Normal bowel sounds heard. Central nervous system: Alert and oriented. No focal neurological deficits. Extremities: Symmetric 5 x 5 power. Skin: No rashes, lesions or ulcers Psychiatry: Judgement and insight appear normal. Mood & affect appropriate.   Data Reviewed: I have personally reviewed following labs and imaging studies  CBC: Recent Labs  Lab 09/19/20 0511 09/20/20 0637 09/21/20 0500  WBC 9.0  6.6 7.8  NEUTROABS 6.4  --   --   HGB 9.4* 7.1* 7.1*  HCT 29.9* 22.7* 21.8*  MCV 77.1* 77.2* 76.5*  PLT 286 333 340    Basic Metabolic Panel: Recent Labs  Lab 09/19/20 0511 09/20/20 0637 09/21/20 0500  NA 137 138 134*  K 2.5* 2.9* 3.0*  CL 91* 106 106  CO2 30 26 21*  GLUCOSE 100* 143* 257*  BUN 40* 33* 27*  CREATININE 1.45* 0.99 1.00  CALCIUM 10.0 8.3* 8.6*  MG 1.9 1.5* 2.3  PHOS 2.1*  --  <1.0*    GFR: Estimated Creatinine Clearance: 47.3 mL/min (by C-G formula based on SCr of 1 mg/dL).  Liver Function Tests: Recent Labs  Lab 09/20/20 0637 09/21/20 0500  AST 67* 97*  ALT 37 46*  ALKPHOS 83 111  BILITOT 1.1 0.8  PROT 5.3* 5.2*  ALBUMIN 1.8* 1.7*    CBG: Recent Labs  Lab 09/21/20 2104 09/22/20 0141 09/22/20 0350 09/22/20 0743 09/22/20 1111  GLUCAP 161* 118* 118* 120* 124*    Recent Results (from the past 240 hour(s))  Resp Panel by RT-PCR (Flu A&B, Covid) Nasopharyngeal Swab     Status: None   Collection Time: 09/19/20  7:05 AM   Specimen: Nasopharyngeal Swab;  Nasopharyngeal(NP) swabs in vial transport medium  Result Value Ref Range Status   SARS Coronavirus 2 by RT PCR NEGATIVE NEGATIVE Final    Comment: (NOTE) SARS-CoV-2 target nucleic acids are NOT DETECTED.  The SARS-CoV-2 RNA is generally detectable in upper respiratory specimens during the acute phase of infection. The lowest concentration of SARS-CoV-2 viral copies this assay can detect is 138 copies/mL. A negative result does not preclude SARS-Cov-2 infection and should not be used as the sole basis for treatment or other patient management decisions. A negative result may occur with  improper specimen collection/handling, submission of specimen other than nasopharyngeal swab, presence of viral mutation(s) within the areas targeted by this assay, and inadequate number of viral copies(<138 copies/mL). A negative result must be combined with clinical observations, patient history, and  epidemiological information. The expected result is Negative.  Fact Sheet for Patients:  BloggerCourse.com  Fact Sheet for Healthcare Providers:  SeriousBroker.it  This test is no t yet approved or cleared by the [place] FDA and  has been authorized for detection and/or diagnosis of SARS-CoV-2 by FDA under an Emergency Use Authorization (EUA). This EUA will remain  in effect (meaning this test can be used) for the duration of the COVID-19 declaration under Section 564(b)(1) of the Act, 21 U.S.C.section 360bbb-3(b)(1), unless the authorization is terminated  or revoked sooner.       Influenza A by PCR NEGATIVE NEGATIVE Final   Influenza B by PCR NEGATIVE NEGATIVE Final    Comment: (NOTE) The Xpert Xpress SARS-CoV-2/FLU/RSV plus assay is intended as an aid in the diagnosis of influenza from Nasopharyngeal swab specimens and should not be used as a sole basis for treatment. Nasal washings and aspirates are unacceptable for Xpert Xpress SARS-CoV-2/FLU/RSV testing.  Fact Sheet for Patients: BloggerCourse.com  Fact Sheet for Healthcare Providers: SeriousBroker.it  This test is not yet approved or cleared by the [place] FDA and has been authorized for detection and/or diagnosis of SARS-CoV-2 by FDA under an Emergency Use Authorization (EUA). This EUA will remain in effect (meaning this test can be used) for the duration of the COVID-19 declaration under Section 564(b)(1) of the Act, 21 U.S.C. section 360bbb-3(b)(1), unless the authorization is terminated or revoked.  Performed at [facility], [address], [place], [place] [address]   Urine culture     Status: Abnormal   Collection Time: 09/19/20  7:10 AM   Specimen: Urine, Random  Result Value Ref Range Status   Specimen Description   Final    URINE, RANDOM Performed at [facility], [address]  [place], [place] [address]    Special Requests   Final    NONE Performed at [facility], [address] [place], [place] [address]    Culture MULTIPLE SPECIES PRESENT, SUGGEST RECOLLECTION (A)  Final   Report Status 09/21/2020 FINAL  Final    Radiology Studies: No results found. Scheduled Meds: . Chlorhexidine Gluconate Cloth  6 each Topical Daily  . insulin aspart  0-9 Units Subcutaneous TID WC  . pantoprazole sodium  40 mg Per Tube Daily  . polyethylene glycol  17 g Per Tube Daily  . Warfarin - Pharmacist Dosing Inpatient   Does not apply q1600   Continuous Infusions: . feeding supplement (OSMOLITE 1.5 CAL)      LOS: 3 days   Time spent: 35 mins  Abdurrahman Petersheim Laural Benes, MD How to contact the [facility] Attending or Consulting provider 7A - 7P or covering provider during after hours 7P -7A,  for this patient?  1. Check the care team in [facility] and look for a) attending/consulting [facility] provider listed and b) the [facility] team listed 2. Log into [email or web address] and use [place]'s universal password to access. If you do not have the password, please contact the hospital operator. 3. Locate the [facility] provider you are looking for under [facility] and page to a number that you can be directly reached. 4. If you still have difficulty reaching the provider, please page the [facility] (Director on Call) for the Hospitalists listed on amion for assistance.  09/22/2020, 1:14 PM

## 2020-09-23 ENCOUNTER — Other Ambulatory Visit: Payer: Medicare Other | Admitting: Primary Care

## 2020-09-23 DIAGNOSIS — E878 Other disorders of electrolyte and fluid balance, not elsewhere classified: Secondary | ICD-10-CM

## 2020-09-23 DIAGNOSIS — Z79899 Other long term (current) drug therapy: Secondary | ICD-10-CM

## 2020-09-23 LAB — CBC
HCT: 24.9 % — ABNORMAL LOW (ref 36.0–46.0)
Hemoglobin: 8.1 g/dL — ABNORMAL LOW (ref 12.0–15.0)
MCH: 25.2 pg — ABNORMAL LOW (ref 26.0–34.0)
MCHC: 32.5 g/dL (ref 30.0–36.0)
MCV: 77.6 fL — ABNORMAL LOW (ref 80.0–100.0)
Platelets: 218 10*3/uL (ref 150–400)
RBC: 3.21 MIL/uL — ABNORMAL LOW (ref 3.87–5.11)
RDW: 20.8 % — ABNORMAL HIGH (ref 11.5–15.5)
WBC: 7.7 10*3/uL (ref 4.0–10.5)
nRBC: 0.3 % — ABNORMAL HIGH (ref 0.0–0.2)

## 2020-09-23 LAB — GLUCOSE, CAPILLARY
Glucose-Capillary: 111 mg/dL — ABNORMAL HIGH (ref 70–99)
Glucose-Capillary: 143 mg/dL — ABNORMAL HIGH (ref 70–99)
Glucose-Capillary: 136 mg/dL — ABNORMAL HIGH (ref 70–99)
Glucose-Capillary: 149 mg/dL — ABNORMAL HIGH (ref 70–99)
Glucose-Capillary: 175 mg/dL — ABNORMAL HIGH (ref 70–99)

## 2020-09-23 LAB — PHOSPHORUS: Phosphorus: <1 mg/dL — CL (ref 2.5–4.6)

## 2020-09-23 LAB — PROTIME-INR
INR: 1.8 — ABNORMAL HIGH (ref 0.8–1.2)
Prothrombin Time: 20 seconds — ABNORMAL HIGH (ref 11.4–15.2)

## 2020-09-23 LAB — MAGNESIUM: Magnesium: 1.7 mg/dL (ref 1.7–2.4)

## 2020-09-23 MED ORDER — POTASSIUM PHOSPHATES 15 MMOLE/5ML IV SOLN
30.0000 mmol | Freq: Once | INTRAVENOUS | Status: AC
  Administered 2020-09-23: 30 mmol via INTRAVENOUS
  Filled 2020-09-23: qty 10

## 2020-09-23 MED ORDER — POTASSIUM & SODIUM PHOSPHATES 280-160-250 MG PO PACK: 1.0000 | PACK | Freq: Three times a day (TID) | ORAL | 1 refills | Status: AC

## 2020-09-23 MED ORDER — AMIODARONE HCL 200 MG PO TABS: 200.0000 mg | ORAL_TABLET | Freq: Every day | ORAL | 11 refills | Status: AC

## 2020-09-23 MED ORDER — MAGNESIUM SULFATE 4 GM/100ML IV SOLN
4.0000 g | Freq: Once | INTRAVENOUS | Status: AC
  Administered 2020-09-23: 4 g via INTRAVENOUS
  Filled 2020-09-23: qty 100

## 2020-09-23 MED ORDER — WARFARIN SODIUM 2 MG PO TABS
2.0000 mg | ORAL_TABLET | Freq: Once | ORAL | Status: DC
  Filled 2020-09-23: qty 1

## 2020-09-23 MED ORDER — ACETAMINOPHEN 325 MG PO TABS: 325.0000 mg | ORAL_TABLET | Freq: Four times a day (QID) | ORAL | Status: AC | PRN

## 2020-09-23 NOTE — Discharge Summary (Signed)
Physician Discharge Summary  Annette Rogers ZOX:[id number] DOB: November 13, 1955 DOA: 09/19/2020  PCP: Oneal GroutAdams, Erica W, FNP  Admit date: 09/19/2020 Discharge date: 09/23/2020  Admitted From:  Home  [facility]  [facility]   Recommendations for Outpatient Follow-up:  1. Follow up with PCP in 1 weeks 2. Please obtain BMP, Mg, Phos in one week 3. Please check PT/INR regularly and adjust warfarin to target INR goal 2-3  Home Health: PT, RN, OT, SW, and SLP through [facility]  Discharge Condition: STABLE  CODE STATUS: FULL    Brief Hospitalization Summary: Please see all hospital notes, images, labs for full details of the hospitalization. Brief Admission History:  65 y.o.femalewith medical history significantfor prior stroke with chronic gastrostomy tube for nutrition and medications, GERD, hyperlipidemia, hyperparathyroidism, hypertension, chronic left-sided hemiplegia from CVA, nonrheumatic mitral valve stenosis status post metallic valve replacement, chronic anticoagulation with warfarin, history of tricuspid valve repair presented to the emergency department complaining of frontal headache that has been present for about 24 hours associated with malaise and nausea and vomiting. Patient having tolerating tube feedings and reports that she believes she may be constipated. She denies vision changes. She denies focal weakness.  ED Course:Patient was evaluated in the ED and noted to have a potassium of 2.5 BUN 40, creatinine 1.45, anion gap 16, phosphorus 2.1, hemoglobin 9.4, hematocrit 29.9, platelet count 286. Patient had an INR of 2.0. Glucose 100. SARS 2 coronavirus testing negative. Influenza A and influenza B testing negative. CT brain no findings of acute infarct. Abdominal radiographs negative. Patient was hypotensive on arrival. Patient was a difficult IV stick and had to have a central line placed for vascular access. IV fluids started electrolyte replacement  started and admission requested for further management.  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 Acute renal failure-RESOLVED.  Secondary to dehydration with poor free water intake and nausea and vomiting. Treated with IV fluids. She was bolused in the emergency department with improvement in blood pressure.  2. Hypotension-improved with rehydration and electrolyte and blood replacement.  3. Symptomatic Anemia of chronic disease - hemoccult stool.  Transfused 1 units PRBC.  Hg now >8.   4. Hypokalemia-severe and was treated with IV potassium supplementation. Resume home Kphos.  5. Hypomagnesemia - IV replacement given.  6. Refeeding syndrome - marked drop in phos, IV phos replacement, IV mag replacement given.  7. Hypophosphatemia - severe - IV replacement was given.  Resume home Kphos. .  8. Warfarin anticoagulation-consulted to pharmacy for warfarin management in the hospital. Daily PT/INR testing. Resume home management at discharge.  Home  Health ordered.   9. Paroxysmal atrial fibrillation-fully anticoagulated with warfarin, resume home beta-blocker therapy and amiodarone when medications have been reconciled. 10. Hyperparathyroidism-monitor electrolytes closely. Outpatient follow up.  11. COPD-stable. 12. GERD-Protonix for GI protection. 13. Headache-resolved now.  Likely was secondary to severe dehydration, hypotension and electrolyte abnormalities, CT head with no acute findings. Pain management as ordered. Follow. 14. Constipation-added MiraLAX daily AND dulcolax suppository with results.  DVT prophylaxis:Warfarin Code Status:Full Family Communication:Patient updated with plan of care at bedside Disposition Plan:Home with HHPT, RN, SLP SW, OT Consults called:PT Admission status:INP  Discharge Diagnoses:  Principal Problem:   Acute renal failure (ARF) (HCC) Active Problems:   COPD (chronic obstructive pulmonary disease) (HCC)   GERD (gastroesophageal reflux disease)   Hyperparathyroidism (HCC)   Non-rheumatic mitral valve stenosis   Paroxysmal A-fib (HCC)   Pulmonary hypertension (HCC)   S/P mitral valve replacement with metallic valve   S/P TVR (tricuspid valve repair)   Warfarin anticoagulation   Gastrojejunostomy tube status (HCC)   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 (HCC)   Hypokalemia   Hypercholesterolemia   On amiodarone therapy   Hypotension   Dehydration   AKI (acute kidney injury) (HCC)   Refeeding syndrome   Symptomatic anemia  Discharge Instructions:  Allergies as of 09/23/2020      Reactions   Oxycodone Nausea Only      Medication List    STOP taking these medications   carvedilol 3.125 MG tablet Commonly known as: COREG   cinacalcet 90 MG tablet Commonly known as: SENSIPAR   cyclobenzaprine 5 MG tablet Commonly known as: FLEXERIL     TAKE these medications   acetaminophen 325 MG tablet Commonly known as: TYLENOL Place 1 tablet (325 mg total) into feeding tube every 6 (six) hours as needed for mild pain. What changed: how to take this   amiodarone 200 MG tablet Commonly known as: PACERONE Place 1 tablet (200 mg total) into feeding tube daily. What changed: how to take this   aspirin 81 MG EC tablet Take 81 mg by mouth daily.   atorvastatin 40 MG tablet Commonly  known as: LIPITOR Take 40 mg by mouth daily.   Cholecalciferol 50 MCG (2000 UT) Tabs Take by mouth.   diclofenac Sodium 1 % Gel Commonly known as: VOLTAREN Apply topically.   feeding supplement (OSMOLITE 1.5 CAL) Liqd Place 1,000 mLs into feeding tube continuous.   mirtazapine 30 MG tablet Commonly known as: REMERON Take 30 mg by mouth at bedtime.   multivitamin with minerals Tabs tablet Place 1 tablet into feeding tube daily.   pantoprazole 40 MG tablet Commonly known as: PROTONIX Take 40 mg by mouth daily.   potassium & sodium phosphates 280-160-250 MG Pack Commonly known as: PHOS-NAK Place 1 packet into feeding tube 4 (four) times daily -  with meals and at bedtime. What changed: how to take this   senna-docusate 8.6-50 MG tablet Commonly known as: Senokot-S Take 1 tablet by mouth 2 (two) times daily.   sodium bicarbonate 650 MG tablet Take 1 tablet (650 mg total) by mouth 2 (two) times daily.   traZODone 50 MG tablet Commonly known as: DESYREL Take 50 mg by mouth daily.   warfarin 2 MG tablet Commonly known as: COUMADIN Take 2 mg by mouth daily.       Follow-up Information    Oneal Grout, FNP Follow up.   Specialty: Family Medicine Contact information: [address] [place] [address] [phone]        [facility].   Specialty: Home Health Services Why: Will contact you to schedule home health visits.              Allergies  Allergen Reactions  . Oxycodone Nausea Only   Allergies as of 09/23/2020      Reactions   Oxycodone Nausea Only      Medication List    STOP taking these medications   carvedilol 3.125 MG tablet Commonly known as: COREG   cinacalcet 90 MG tablet Commonly known as: SENSIPAR   cyclobenzaprine 5 MG tablet Commonly known as: FLEXERIL     TAKE these medications   acetaminophen 325 MG tablet Commonly known as: TYLENOL Place 1 tablet (325 mg total) into feeding tube every 6  (six) hours as needed for mild pain. What changed: how to take this   amiodarone 200 MG tablet Commonly known as: PACERONE Place 1 tablet (200 mg total) into feeding tube daily. What changed: how to take this   aspirin 81 MG EC tablet Take 81 mg by mouth daily.   atorvastatin 40 MG tablet Commonly known as: LIPITOR Take 40 mg by mouth daily.   Cholecalciferol 50 MCG (2000 UT) Tabs Take by mouth.   diclofenac Sodium 1 % Gel Commonly known as: VOLTAREN Apply topically.   feeding supplement (OSMOLITE 1.5 CAL) Liqd Place 1,000 mLs into feeding tube continuous.   mirtazapine 30 MG tablet Commonly known as: REMERON Take 30 mg by mouth at bedtime.   multivitamin with minerals Tabs tablet Place 1 tablet into feeding tube daily.   pantoprazole 40 MG tablet Commonly known as: PROTONIX Take 40 mg by mouth daily.   potassium & sodium phosphates 280-160-250 MG Pack Commonly known as: PHOS-NAK Place 1 packet into feeding tube 4 (four) times daily -  with meals and at bedtime. What changed: how to take this   senna-docusate 8.6-50 MG tablet Commonly known as: Senokot-S Take 1 tablet by mouth 2 (two) times daily.   sodium bicarbonate 650 MG tablet Take 1 tablet (650 mg total) by mouth 2 (two) times daily.   traZODone 50 MG tablet Commonly known as: DESYREL Take 50 mg by mouth daily.   warfarin 2 MG tablet Commonly known as: COUMADIN Take 2 mg by mouth daily.       Procedures/Studies: CT HEAD WO CONTRAST  Result Date: 09/19/2020 CLINICAL DATA:  Headache EXAM: CT HEAD WITHOUT CONTRAST TECHNIQUE: Contiguous axial images were obtained from the base of the skull through the vertex without intravenous contrast. COMPARISON:  None. FINDINGS: Brain: Remote right frontal infarct noted. Remote lacunar infarct involving the right caudate nucleus. No evidence of acute intracranial hemorrhage or infarct. No abnormal mass effect or midline shift. No abnormal intra or extra-axial mass  lesion or fluid collection. Ventricular size is normal. Cerebellum is unremarkable. Vascular: No asymmetric hyperdense vasculature at the skull base Skull: Intact Sinuses/Orbits: Moderate mucosal thickening within the right sphenoid sinus. Remaining paranasal sinuses are clear. Visualized orbits are unremarkable. Other: Mastoid air cells and middle ear cavities are clear. IMPRESSION: Multiple remote infarcts. No evidence of acute intracranial hemorrhage or infarct. Electronically Signed   By: Helyn Numbers MD   On: 09/19/2020 04:55   DG Chest Portable 1 View  Result Date: 09/19/2020 CLINICAL DATA:  Status post PICC placement History of hypertension CABG, mitral valve replacement EXAM: PORTABLE CHEST 1 VIEW COMPARISON:  None. FINDINGS: The heart size and mediastinal contours are within normal limits. Both lungs are clear. The visualized skeletal structures are unremarkable. Right IJ central venous catheter terminates at the cavoatrial junction. Postsurgical changes of CABG and valve replacement are seen. IMPRESSION: Right IJ central venous catheter in appropriate position. No pneumothorax. Electronically  Signed   By: Acquanetta BellingFarhaan  Mir M.D.   On: 09/19/2020 07:49   DG ABD ACUTE 2+V W 1V CHEST  Result Date: 09/19/2020 CLINICAL DATA:  Constipation, hypertension, COPD EXAM: DG ABDOMEN ACUTE WITH 1 VIEW CHEST COMPARISON:  None. FINDINGS: There is no evidence of dilated bowel loops or free intraperitoneal air. Gastrojejunostomy catheter appears looped with its tip within the expected proximal jejunum. Surgical clips noted within the mid abdomen. No radiopaque calculi or other significant radiographic abnormality is seen. Heart size and mediastinal contours are within normal limits. Coronary artery bypass grafting and mitral valve replacement have been performed. Both lungs are clear. IMPRESSION: Negative abdominal radiographs.  No acute cardiopulmonary disease. Electronically Signed   By: Helyn NumbersAshesh  Parikh MD   On:  09/19/2020 04:52      Subjective: Pt says she is feeling a lot better.  She still refuses SNF but agreeable to home health services.    Discharge Exam: Vitals:   09/22/20 2124 09/23/20 0546  BP: (!) 92/47 (!) 103/45  Pulse: 93 99  Resp: 18 18  Temp: 98.7 F (37.1 C) 98.3 F (36.8 C)  SpO2:  98%   Vitals:   09/22/20 0623 09/22/20 1423 09/22/20 2124 09/23/20 0546  BP: 97/61 (!) 86/53 (!) 92/47 (!) 103/45  Pulse: 97 95 93 99  Resp: 18  18 18   Temp: 98.2 F (36.8 C) 98.9 F (37.2 C) 98.7 F (37.1 C) 98.3 F (36.8 C)  TempSrc: Oral Oral Oral Oral  SpO2: 99% 94%  98%  Weight: 67.3 kg     Height:       General exam: chronically ill and emaciated appearing, awake, and alert, Appears calm and comfortable  Respiratory system: Clear to auscultation. Respiratory effort normal. Cardiovascular system: normal S1 & S2 heard. No JVD, murmurs, rubs, gallops or clicks. No pedal edema. Gastrointestinal system: G-Tube site appears well.  Abdomen is nondistended, soft and nontender. No organomegaly or masses felt. Normal bowel sounds heard. Central nervous system: Alert and oriented. No focal neurological deficits. Extremities: Symmetric 5 x 5 power. Skin: No rashes, lesions or ulcers Psychiatry: Judgement and insight appear normal. Mood & affect appropriate.   The results of significant diagnostics from this hospitalization (including imaging, microbiology, ancillary and laboratory) are listed below for reference.     Microbiology: Recent Results (from the past 240 hour(s))  Resp Panel by RT-PCR (Flu A&B, Covid) Nasopharyngeal Swab     Status: None   Collection Time: 09/19/20  7:05 AM   Specimen: Nasopharyngeal Swab; Nasopharyngeal(NP) swabs in vial transport medium  Result Value Ref Range Status   SARS Coronavirus 2 by RT PCR NEGATIVE NEGATIVE Final    Comment: (NOTE) SARS-CoV-2 target nucleic acids are NOT DETECTED.  The SARS-CoV-2 RNA is generally detectable in upper  respiratory specimens during the acute phase of infection. The lowest concentration of SARS-CoV-2 viral copies this assay can detect is 138 copies/mL. A negative result does not preclude SARS-Cov-2 infection and should not be used as the sole basis for treatment or other patient management decisions. A negative result may occur with  improper specimen collection/handling, submission of specimen other than nasopharyngeal swab, presence of viral mutation(s) within the areas targeted by this assay, and inadequate number of viral copies(<138 copies/mL). A negative result must be combined with clinical observations, patient history, and epidemiological information. The expected result is Negative.  Fact Sheet for Patients:  BloggerCourse.comhttps://www.fda.gov/media/152166/download  Fact Sheet for Healthcare Providers:  SeriousBroker.ithttps://www.fda.gov/media/152162/download  This test is no t yet  approved or cleared by the [place] and  has been authorized for detection and/or diagnosis of SARS-CoV-2 by FDA under an Emergency Use Authorization (EUA). This EUA will remain  in effect (meaning this test can be used) for the duration of the COVID-19 declaration under Section 564(b)(1) of the Act, 21 U.S.C.section 360bbb-3(b)(1), unless the authorization is terminated  or revoked sooner.       Influenza A by PCR NEGATIVE NEGATIVE Final   Influenza B by PCR NEGATIVE NEGATIVE Final    Comment: (NOTE) The Xpert Xpress SARS-CoV-2/FLU/RSV plus assay is intended as an aid in the diagnosis of influenza from Nasopharyngeal swab specimens and should not be used as a sole basis for treatment. Nasal washings and aspirates are unacceptable for Xpert Xpress SARS-CoV-2/FLU/RSV testing.  Fact Sheet for Patients: BloggerCourse.com  Fact Sheet for Healthcare Providers: SeriousBroker.it  This test is not yet approved or cleared by the [place] FDA and has been  authorized for detection and/or diagnosis of SARS-CoV-2 by FDA under an Emergency Use Authorization (EUA). This EUA will remain in effect (meaning this test can be used) for the duration of the COVID-19 declaration under Section 564(b)(1) of the Act, 21 U.S.C. section 360bbb-3(b)(1), unless the authorization is terminated or revoked.  Performed at [facility], [address] [place], [place] [address]   Urine culture     Status: Abnormal   Collection Time: 09/19/20  7:10 AM   Specimen: Urine, Random  Result Value Ref Range Status   Specimen Description   Final    URINE, RANDOM Performed at [facility], [address], [place], [place] [address]    Special Requests   Final    NONE Performed at [facility], [address] [place], [place] [address]    Culture MULTIPLE SPECIES PRESENT, SUGGEST RECOLLECTION (A)  Final   Report Status 09/21/2020 FINAL  Final     Labs: BNP (last 3 results) No results for input(s): BNP in the last 8760 hours. Basic Metabolic Panel: Recent Labs  Lab 09/19/20 0511 09/20/20 0637 09/21/20 0500 09/22/20 1456 09/23/20 0805  NA 137 138 134* 137  --   K 2.5* 2.9* 3.0* 4.4  --   CL 91* 106 106 109  --   CO2 30 26 21* 21*  --   GLUCOSE 100* 143* 257* 139*  --   BUN 40* 33* 27* 24*  --   CREATININE 1.45* 0.99 1.00 0.87  --   CALCIUM 10.0 8.3* 8.6* 7.9*  --   MG 1.9 1.5* 2.3 1.8 1.7  PHOS 2.1*  --  <1.0* 0.9* <1.0*   Liver Function Tests: Recent Labs  Lab 09/20/20 0637 09/21/20 0500  AST 67* 97*  ALT 37 46*  ALKPHOS 83 111  BILITOT 1.1 0.8  PROT 5.3* 5.2*  ALBUMIN 1.8* 1.7*   No results for input(s): LIPASE, AMYLASE in the last 168 hours. No results for input(s): AMMONIA in the last 168 hours. CBC: Recent Labs  Lab 09/19/20 0511 09/20/20 0637 09/21/20 0500 09/22/20 1456 09/23/20 0805  WBC 9.0 6.6 7.8 9.0 7.7  NEUTROABS 6.4  --   --   --   --   HGB 9.4* 7.1* 7.1* 8.8* 8.1*  HCT 29.9* 22.7* 21.8* 26.6* 24.9*  MCV 77.1* 77.2*  76.5* 77.3* 77.6*  PLT 286 333 340 290 218   Cardiac Enzymes: No results for input(s): CKTOTAL, CKMB, CKMBINDEX, TROPONINI in the last 168 hours. BNP: Invalid input(s): POCBNP CBG: Recent Labs  Lab 09/22/20  2122 09/23/20 0215 09/23/20 0544 09/23/20 0739 09/23/20 1108  GLUCAP 126* 111* 143* 136* 149*   D-Dimer No results for input(s): DDIMER in the last 72 hours. Hgb A1c No results for input(s): HGBA1C in the last 72 hours. Lipid Profile No results for input(s): CHOL, HDL, LDLCALC, TRIG, CHOLHDL, LDLDIRECT in the last 72 hours. Thyroid function studies No results for input(s): TSH, T4TOTAL, T3FREE, THYROIDAB in the last 72 hours.  Invalid input(s): FREET3 Anemia work up No results for input(s): VITAMINB12, FOLATE, FERRITIN, TIBC, IRON, RETICCTPCT in the last 72 hours. Urinalysis    Component Value Date/Time   COLORURINE AMBER (A) 09/19/2020 0710   APPEARANCEUR CLOUDY (A) 09/19/2020 0710   LABSPEC 1.016 09/19/2020 0710   PHURINE 7.0 09/19/2020 0710   GLUCOSEU NEGATIVE 09/19/2020 0710   HGBUR SMALL (A) 09/19/2020 0710   BILIRUBINUR NEGATIVE 09/19/2020 0710   KETONESUR 5 (A) 09/19/2020 0710   PROTEINUR 100 (A) 09/19/2020 0710   NITRITE NEGATIVE 09/19/2020 0710   LEUKOCYTESUR TRACE (A) 09/19/2020 0710   Sepsis Labs Invalid input(s): PROCALCITONIN,  WBC,  LACTICIDVEN Microbiology Recent Results (from the past 240 hour(s))  Resp Panel by RT-PCR (Flu A&B, Covid) Nasopharyngeal Swab     Status: None   Collection Time: 09/19/20  7:05 AM   Specimen: Nasopharyngeal Swab; Nasopharyngeal(NP) swabs in vial transport medium  Result Value Ref Range Status   SARS Coronavirus 2 by RT PCR NEGATIVE NEGATIVE Final    Comment: (NOTE) SARS-CoV-2 target nucleic acids are NOT DETECTED.  The SARS-CoV-2 RNA is generally detectable in upper respiratory specimens during the acute phase of infection. The lowest concentration of SARS-CoV-2 viral copies this assay can detect is 138  copies/mL. A negative result does not preclude SARS-Cov-2 infection and should not be used as the sole basis for treatment or other patient management decisions. A negative result may occur with  improper specimen collection/handling, submission of specimen other than nasopharyngeal swab, presence of viral mutation(s) within the areas targeted by this assay, and inadequate number of viral copies(<138 copies/mL). A negative result must be combined with clinical observations, patient history, and epidemiological information. The expected result is Negative.  Fact Sheet for Patients:  BloggerCourse.com  Fact Sheet for Healthcare Providers:  SeriousBroker.it  This test is no t yet approved or cleared by the [place] FDA and  has been authorized for detection and/or diagnosis of SARS-CoV-2 by FDA under an Emergency Use Authorization (EUA). This EUA will remain  in effect (meaning this test can be used) for the duration of the COVID-19 declaration under Section 564(b)(1) of the Act, 21 U.S.C.section 360bbb-3(b)(1), unless the authorization is terminated  or revoked sooner.       Influenza A by PCR NEGATIVE NEGATIVE Final   Influenza B by PCR NEGATIVE NEGATIVE Final    Comment: (NOTE) The Xpert Xpress SARS-CoV-2/FLU/RSV plus assay is intended as an aid in the diagnosis of influenza from Nasopharyngeal swab specimens and should not be used as a sole basis for treatment. Nasal washings and aspirates are unacceptable for Xpert Xpress SARS-CoV-2/FLU/RSV testing.  Fact Sheet for Patients: BloggerCourse.com  Fact Sheet for Healthcare Providers: SeriousBroker.it  This test is not yet approved or cleared by the [place] FDA and has been authorized for detection and/or diagnosis of SARS-CoV-2 by FDA under an Emergency Use Authorization (EUA). This EUA will remain in effect (meaning  this test can be used) for the duration of the COVID-19 declaration under Section 564(b)(1) of the Act, 21 U.S.C. section 360bbb-3(b)(1), unless  the authorization is terminated or revoked.  Performed at [facility], [address], [place], [place] [address]   Urine culture     Status: Abnormal   Collection Time: 09/19/20  7:10 AM   Specimen: Urine, Random  Result Value Ref Range Status   Specimen Description   Final    URINE, RANDOM Performed at [facility], [address], [place], [place] [address]    Special Requests   Final    NONE Performed at [facility], [address], [place], [place] [address]    Culture MULTIPLE SPECIES PRESENT, SUGGEST RECOLLECTION (A)  Final   Report Status 09/21/2020 FINAL  Final   Time coordinating discharge:  38 mins   SIGNED:  Standley Dakins, MD  [facility] 09/23/2020, 1:36 PM How to contact the [facility] Attending or Consulting provider 7A - 7P or covering provider during after hours 7P -7A, for this patient?  1. Check the care team in [facility] and look for a) attending/consulting [facility] provider listed and b) the [facility] team listed 2. Log into [email or web address] and use [place]'s universal password to access. If you do not have the password, please contact the hospital operator. 3. Locate the [facility] provider you are looking for under [facility] and page to a number that you can be directly reached. 4. If you still have difficulty reaching the provider, please page the [facility] (Director on Call) for the Hospitalists listed on amion for assistance.

## 2020-09-23 NOTE — TOC Transition Note (Signed)
Transition of Care [facility]) - CM/SW Discharge Note   Patient Details  Name: Annette Rogers MRN: [id number] Date of Birth: Aug 26, 1956  Transition of Care [facility]) CM/SW Contact:  Karn Cassis, LCSW Phone Number: 09/23/2020, 2:37 PM   Clinical Narrative: Pt d/c today and will return home with home health PT, RN, OT, SW, and SLP through [facility]. LCSW spoke with pt's daughter who requests EMS transport home. She confirmed address and that family would be present upon arrival.  RN will call EMS after infusion is completed.      Final next level of care: Home w Home Health Services Barriers to Discharge: Barriers Resolved   Patient Goals and CMS Choice Patient states their goals for this hospitalization and ongoing recovery are:: return home      Discharge Placement                  Name of family member notified: Annette Rogers- daughter Patient and family notified of of transfer: 09/23/20  Discharge Plan and Services In-house Referral: Clinical Social Work   Post Acute Care Choice: Resumption of Paediatric nurse            DME Agency: NA       HH Arranged: RN,PT,OT,Social Work,Speech Therapy HH Agency: [facility] ([facility]) Date HH Agency Contacted: 09/23/20 Time HH Agency Contacted: [phone] Representative spoke with at [facility] Agency: Bonita Quin  Social Determinants of Health (SDOH) Interventions     Readmission Risk Interventions Readmission Risk Prevention Plan 09/20/2020  Transportation Screening Complete  PCP or Specialist Appt within 3-5 Days Complete  HRI or Home Care Consult Complete  Social Work Consult for Recovery Care Planning/Counseling Complete  Palliative Care Screening Not Applicable  Medication Review [employer]) Complete  Some recent data might be hidden

## 2020-09-23 NOTE — Discharge Instructions (Signed)
  IMPORTANT INFORMATION: PAY CLOSE ATTENTION   PHYSICIAN DISCHARGE INSTRUCTIONS  Follow with Primary care provider  Oneal Grout, FNP  and other consultants as instructed by your Hospitalist Physician  SEEK MEDICAL CARE OR RETURN TO EMERGENCY ROOM IF SYMPTOMS COME BACK, WORSEN OR NEW PROBLEM DEVELOPS   Please note: You were cared for by a hospitalist during your hospital stay. Every effort will be made to forward records to your primary care provider.  You can request that your primary care provider send for your hospital records if they have not received them.  Once you are discharged, your primary care physician will handle any further medical issues. Please note that NO REFILLS for any discharge medications will be authorized once you are discharged, as it is imperative that you return to your primary care physician (or establish a relationship with a primary care physician if you do not have one) for your post hospital discharge needs so that they can reassess your need for medications and monitor your lab values.  Please get a complete blood count and chemistry panel checked by your Primary MD at your next visit, and again as instructed by your Primary MD.  Get Medicines reviewed and adjusted: Please take all your medications with you for your next visit with your Primary MD  Laboratory/radiological data: Please request your Primary MD to go over all hospital tests and procedure/radiological results at the follow up, please ask your primary care provider to get all Hospital records sent to his/her office.  In some cases, they will be blood work, cultures and biopsy results pending at the time of your discharge. Please request that your primary care provider follow up on these results.  If you are diabetic, please bring your blood sugar readings with you to your follow up appointment with primary care.    Please call and make your follow up appointments as soon as possible.    Also  Note the following: If you experience worsening of your admission symptoms, develop shortness of breath, life threatening emergency, suicidal or homicidal thoughts you must seek medical attention immediately by calling 911 or calling your MD immediately  if symptoms less severe.  You must read complete instructions/literature along with all the possible adverse reactions/side effects for all the Medicines you take and that have been prescribed to you. Take any new Medicines after you have completely understood and accpet all the possible adverse reactions/side effects.   Do not drive when taking Pain medications or sleeping medications (Benzodiazepines)  Do not take more than prescribed Pain, Sleep and Anxiety Medications. It is not advisable to combine anxiety,sleep and pain medications without talking with your primary care practitioner  Special Instructions: If you have smoked or chewed Tobacco  in the last 2 yrs please stop smoking, stop any regular Alcohol  and or any Recreational drug use.  Wear Seat belts while driving.  Do not drive if taking any narcotic, mind altering or controlled substances or recreational drugs or alcohol.

## 2020-09-23 NOTE — Progress Notes (Signed)
ANTICOAGULATION CONSULT NOTE -  Pharmacy Consult for warfarin Indication: atrial fibrillation  Allergies  Allergen Reactions  . Oxycodone Nausea Only    Patient Measurements: Height: 5' (152.4 cm) Weight: 67.3 kg (148 lb 5.9 oz) IBW/kg (Calculated) : 45.5  Vital Signs: Temp: 98.3 F (36.8 C) (01/10 0546) Temp Source: Oral (01/10 0546) BP: 103/45 (01/10 0546) Pulse Rate: 99 (01/10 0546)  Labs: Recent Labs    09/21/20 0500 09/22/20 1456  HGB 7.1* 8.8*  HCT 21.8* 26.6*  PLT 340 290  LABPROT 29.3* 25.2*  INR 2.9* 2.4*  CREATININE 1.00 0.87    Estimated Creatinine Clearance: 54.4 mL/min (by C-G formula based on SCr of 0.87 mg/dL).   Medical History: Past Medical History:  Diagnosis Date  . COPD (chronic obstructive pulmonary disease) (HCC) 04/23/2020  . Gastrojejunostomy tube status (HCC) 05/31/2020  . GERD (gastroesophageal reflux disease) 07/15/2020  . High cholesterol   . Hypercalcemia 07/15/2020  . Hyperparathyroidism (HCC) 05/31/2020  . Hypertension   . Left hemiplegia (HCC) 04/29/2020  . Non-rheumatic mitral valve stenosis 12/09/2017  . Paroxysmal A-fib (HCC) 04/23/2020  . S/P mitral valve replacement with metallic valve 04/29/2020   Formatting of this note might be different from the original. [employer]. Jude regent mechanical  Procedures:   1. Mitral valve replacement (25 mm [employer] Regent) (CPT [phone]) 2. Tricuspid valve repair (annuloplasty only)  (30  mm CE Physio ring) (CPT 33464) 4. 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 Procedures:   1. Mitral valve replacement (25 mm [employer] Regent) (CPT [phone]) 2. Tricuspid valve repair (annuloplasty only)  (30  mm CE Physio ring) (CPT 33464) 4. 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 Stroke [facility])     Medications:  Medications Prior to Admission  Medication Sig Dispense Refill Last Dose  .  acetaminophen (TYLENOL) 325 MG tablet Take 325 mg by mouth every 6 (six) hours as needed for mild pain.   Past Month at Unknown time  . amiodarone (PACERONE) 200 MG tablet Take 200 mg by mouth daily.   Past Month at Unknown time  . aspirin 81 MG EC tablet Take 81 mg by mouth daily.   Past Month at Unknown time  . atorvastatin (LIPITOR) 40 MG tablet Take 40 mg by mouth daily.   Past Month at Unknown time  . carvedilol (COREG) 3.125 MG tablet Take 1 tablet (3.125 mg total) by mouth 2 (two) times daily with a meal. 60 tablet 1 Past Week at Unknown time  . Cholecalciferol 50 MCG (2000 UT) TABS Take by mouth.   Past Week at Unknown time  . [EXPIRED] cinacalcet (SENSIPAR) 90 MG tablet Take 90 mg by mouth daily.   Past Week at Unknown time  . diclofenac Sodium (VOLTAREN) 1 % GEL Apply topically.   Past Month at Unknown time  . mirtazapine (REMERON) 30 MG tablet Take 30 mg by mouth at bedtime.   Past Week at Unknown time  . Multiple Vitamin (MULTIVITAMIN WITH MINERALS) TABS tablet Place 1 tablet into feeding tube daily.   Past Week at Unknown time  . pantoprazole (PROTONIX) 40 MG tablet Take 40 mg by mouth daily.   Past Month at Unknown time  . potassium & sodium phosphates (PHOS-NAK) 280-160-250 MG PACK Take 1 packet by mouth 4 (four) times daily -  with meals and at bedtime. 120 each 0 Past Week at Unknown  time  . senna-docusate (SENOKOT-S) 8.6-50 MG tablet Take 1 tablet by mouth 2 (two) times daily. 60 tablet 1 Past Month at Unknown time  . sodium bicarbonate 650 MG tablet Take 1 tablet (650 mg total) by mouth 2 (two) times daily. 60 tablet 1 Past Month at Unknown time  . traZODone (DESYREL) 50 MG tablet Take 50 mg by mouth daily.   Past Week at Unknown time  . warfarin (COUMADIN) 2 MG tablet Take 2 mg by mouth daily.   09/17/2020  . cyclobenzaprine (FLEXERIL) 5 MG tablet Take 1 tablet (5 mg total) by mouth 3 (three) times daily as needed. For muscle spasms.  May cause drowsiness 15 tablet 0   .  Nutritional Supplements (FEEDING SUPPLEMENT, OSMOLITE 1.5 CAL,) LIQD Place 1,000 mLs into feeding tube continuous. 1000 mL 10     Assessment: Pharmacy consulted to dose warfarin in patient with atrial fibrillation.  INR on admission  2.0. Patient also nonrheumatic mitral valve stenosis status post metallic valve replacement, chronic anticoagulation with warfarin.  Home dose listed as 2 mg daily.  Patient on amiodarone PTA.  hgb 9.4> 7.2> 8.8- chronic anemia   INR 2.4 yesterday and no Coumadin given, will dose according to this.   Goal of Therapy:  INR 2.5-3.5 Monitor platelets by anticoagulation protocol: Yes   Plan:  Warfarin 2 mg x 1 dose.  Daily INR and every other day CBC Monitor for S/S of bleeding.  Elder Cyphers, BS Pharm D, BCPS Clinical Pharmacist Pager [phone] 09/23/2020 7:23 AM

## 2020-09-23 NOTE — Progress Notes (Signed)
CRITICAL VALUE ALERT  Critical Value:  Phosphorus <1  Date & Time Notied:  09/23/20 0915  Provider Notified: Laural Benes  Orders Received/Actions taken: Potassium Phosphate ordered IV

## 2020-09-23 NOTE — Progress Notes (Signed)
   09/23/20 1344  Assess: MEWS Score  Temp 98.6 F (37 C)  BP (!) 71/37  Pulse Rate 91  Resp 19  SpO2 100 %  O2 Device Room Air  Assess: MEWS Score  MEWS Temp 0  MEWS Systolic 2  MEWS Pulse 0  MEWS RR 0  MEWS LOC 0  MEWS Score 2  MEWS Score Color Yellow  Assess: if the MEWS score is Yellow or Red  Were vital signs taken at a resting state? Yes  Focused Assessment No change from prior assessment  Early Detection of Sepsis Score *See Row Information* Low  MEWS guidelines implemented *See Row Information* No, vital signs rechecked

## 2020-09-23 NOTE — Progress Notes (Signed)
[facility] here to transport patient home. Daughter, [name], made aware of discharge and is to be at patient's home when EMS arrives. CVC to right neck removed, G-tube clamped. D/C instructions reviewed with [facility] via telephone, copy to be sent with patient. Will continue to monitor.

## 2020-09-23 NOTE — Progress Notes (Signed)
Phosphorus infusion completed as well as Magnesium sulfate. [facility] notified of transport needed to home. CVC to right neck removed. Will continue to monitor.

## 2020-09-25 ENCOUNTER — Telehealth: Payer: Self-pay

## 2020-09-25 NOTE — Telephone Encounter (Signed)
Phone call placed to patient's daughter, [name], to check in on patent post hospitalization. VM left with purpose of call and call back information. Also, left in message that Palliative NP will be in patient's area on 10/07/20 and that this RN placed patient on schedule. Requested return call.

## 2020-09-26 ENCOUNTER — Telehealth: Payer: Self-pay | Admitting: Primary Care

## 2020-09-26 NOTE — Telephone Encounter (Signed)
T/c from [facility] SW to discuss patient resources. She is on EMT managed care services, and has potential for med packaging. SW will follow up with home services coordination.

## 2020-09-30 ENCOUNTER — Ambulatory Visit (INDEPENDENT_AMBULATORY_CARE_PROVIDER_SITE_OTHER): Payer: Medicare Other | Admitting: Gastroenterology

## 2020-10-03 ENCOUNTER — Telehealth (INDEPENDENT_AMBULATORY_CARE_PROVIDER_SITE_OTHER): Payer: Self-pay

## 2020-10-03 ENCOUNTER — Ambulatory Visit (INDEPENDENT_AMBULATORY_CARE_PROVIDER_SITE_OTHER): Payer: Medicare Other | Admitting: Gastroenterology

## 2020-10-03 NOTE — Telephone Encounter (Signed)
noted 

## 2020-10-07 ENCOUNTER — Other Ambulatory Visit: Payer: Medicare Other | Admitting: Primary Care

## 2020-10-07 ENCOUNTER — Other Ambulatory Visit: Payer: Self-pay

## 2020-10-07 DIAGNOSIS — I63511 Cerebral infarction due to unspecified occlusion or stenosis of right middle cerebral artery: Secondary | ICD-10-CM

## 2020-10-07 DIAGNOSIS — G8194 Hemiplegia, unspecified affecting left nondominant side: Secondary | ICD-10-CM

## 2020-10-07 DIAGNOSIS — Z515 Encounter for palliative care: Secondary | ICD-10-CM

## 2020-10-07 DIAGNOSIS — E213 Hyperparathyroidism, unspecified: Secondary | ICD-10-CM

## 2020-10-07 NOTE — Progress Notes (Addendum)
[facility] Consult Note Telephone: [phone]  Fax: [phone]     Date of encounter: 10/07/20 PATIENT NAME: Annette Rogers [address] [place] [address] [phone] (home) [phone] (work) DOB: 07/22/1956 MRN: [id number]  PRIMARY CARE PROVIDER:    Ludwig Rogers, [place],  [place] [address] [phone]  REFERRING PROVIDER:   Ludwig Rogers, [place],  [place] [address] [phone]  RESPONSIBLE PARTY:   Extended Emergency Contact Information Primary Emergency Contact: Annette Rogers Home Phone: [phone] Mobile Phone: [phone] Relation: Sister Secondary Emergency Contact: Annette Rogers Mobile Phone: [phone] Relation: Daughter  I met face to face with patient and family in home.  Palliative Care was asked to follow this patient by consultation request of Annette Clarks, FNP to help address advance care planning and goals of care. This is a follow up  visit.   ASSESSMENT AND RECOMMENDATIONS:   1. Advance Care Planning/Goals of Care: Goals include to maximize quality of life and symptom management. Our advance care planning conversation included a discussion about:     The value and importance of advance care planning   Exploration of personal, cultural or spiritual beliefs that might influence medical decisions   Identification and preparation of a healthcare agent - pt requests Annette Rogers  No directives on file. Limited health care literacy RE making directives. Does ask her daughter Annette Rogers be her POA but no official paper work has been done.Family insistent all care with be at home and not facility. We discussed their disappointment with the health care system. Patient has been referred to LTC and they don't want this. However her care is complex but they feel they can handle their mother's care.  She is known to several support agencies in the area and to home health. Goals  include maintaining her in the home, and family states they would not want LTC.  I spent 20 minutes providing this consultation,  from 1400 to 1420. More than 50% of the time in this consultation was spent in counseling and care coordination. -------------------------------------------------------------------------------------------------  2. Symptom Management:   Thrush: T/c to pharmacy to discuss med profile. Nystatin  200,000 units/ml: 5 mls qid swish and swallow x 7 days. Family states poor intake. Family would also like gtube out. Education provided tube would not be removed without po intake. We discussed thrush impacting her eating, and Rx may help her intake. Pt endorses dysgeusia.  Med review: Meds reviewed in home, many meds on list are not present in home. Family thinks her Sensipar has stopped but then also states they don't have any information about her meds or what she should be on. Coumadin is on her list but not in the home, and no plan for monitoring per family. There is no other anti coagulant (DOAC) in her med area. Family states they put meds in PEG tube.  Pt has ondansetron at pharmacy but this has not been picked up. There was some issue with affording meds but EMS had applied for her to have a grant to pay for these.  Nausea; has ondansetron on file at pharmacy. Asked to refill and send. Poor intake, sometimes vomits bile. Not eating much and refusing gtube feedings per daughter  And granddaughter in the home.  Headache: Endorses frontal headache. Has not eaten today. Education RE needing to eat or take supplemental nutrition eg boost. recommend OTC analgesics.Unclear as to why she is not being fed via tube. No changes  in neuro signs; at baseline with L sided deficits. Also appears dehydrated due to elevated and bounding pulse, and h/o n/v.  Mobility: Still has no located sling for hoyer lift. Family does not know the make or model of lift. Don't know where it came from.  Potentially was delivered after cva in Aug 2021.  I will have our nurse f/u with d/c source to see if we can get her another sling. Currently she is entirely bed bound but family did endorse getting her up to her tilt chair.  Nutrition: family not using tube, pt refusing feeds per their report. Endorse poor appetite and poor intake.Education provided about using tube if not enoughj po intake. Family requests d/c of tube, eduction provided pt needs to be able to eat orally.Today she appears dehydrated.  Reinforced use of po fluids and electrolyte replacement.Suggested nutritional supplements like Boost, Boost Breeze or similar, or powerade type drink.   3. Follow up Palliative Care Visit: Palliative care will continue to follow for goals of care clarification and symptom management. Return 2 weeks or prn.  4. Family /Caregiver/Community Supports: Lives with family in small duplex. Case has been reported to APS and I think still bears investigation. I will t/c APS next business day to discuss.   5. Cognitive / Functional decline: a and o x 2-3, sobbing and crying out  due to head ache. Bedbound, oob with total assist. Dependent in all adls and iadls.  CODE STATUS: FULL  PPS: 30%  HOSPICE ELIGIBILITY/DIAGNOSIS: TBD  Subjective:  CHIEF COMPLAINT: headache and nauseae  HISTORY OF PRESENT ILLNESS:  Annette Rogers is a 65 y.o. year old female  with cva in 8/21 with many deficits. Has developed some wounds due to immobility. Has increased calcium and nausea/vomiting. g tube s/p CVA but family is not using and pt is eating orally although  Not enough. Today her nausea is ongoing  and impacts her intake. This is in the context of hypercalcemeia.She has not eaten today due to nausea. She has not used an antiemetic although she was prescribed one in the past. Nothing seems to help the nausea per her report. We are asked to consult around advance care planning and complex medical decision making.    Review  and summarization of old Epic records shows or history from other than patient  shows recent and past history.. Review or lab tests, radiology,  or medicine RE calcium and GFR WNL on last lab. Transfusion on 09/25/20 . Review of case with family member daughter and granddaughter Decision for obtaining previous records  History obtained from review of EMR, discussion with primary team, and  interview with family, caregiver  and/or Ms. Renda. Records reviewed and summarized above.   CURRENT PROBLEM LIST:  Patient Active Problem List   Diagnosis Date Noted  . Refeeding syndrome 09/21/2020  . Symptomatic anemia 09/21/2020  . Hypotension 09/19/2020  . Dehydration 09/19/2020  . AKI (acute kidney injury) ([place]) 09/19/2020  . Acute renal failure (ARF) ([place]) 09/19/2020  . Chronic knee pain 08/27/2020  . 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 On amiodarone therapy 07/29/2020  . Malfunction of percutaneous endoscopic gastrostomy (PEG) tube ([place])   . Goals of care, counseling/discussion   . Palliative care by specialist   . Hypomagnesemia 07/16/2020  . Hypophosphatemia 07/16/2020  . Hypercalcemia 07/15/2020  . GERD (gastroesophageal reflux disease) 07/15/2020  . Gastric ulcer 07/15/2020  . Hypertension   . Hypokalemia   . Hyponatremia   . Normocytic anemia   .  Hyperparathyroidism ([place]) 05/31/2020  . Gastrojejunostomy tube status ([place]) 05/31/2020  . Warfarin anticoagulation 05/15/2020  . Thrombocytosis 05/06/2020  . Left hemiplegia ([place]) 04/29/2020  . S/P mitral valve replacement with metallic valve 54/56/2563  . On enteral nutrition 04/29/2020  . 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 COPD (chronic obstructive pulmonary disease) ([place]) 04/23/2020  . Paroxysmal A-fib ([place]) 04/23/2020  . Pulmonary hypertension ([place]) 04/23/2020  . Hypercholesterolemia 04/23/2020  . Non-rheumatic mitral valve stenosis  12/09/2017   PAST MEDICAL HISTORY:  Active Ambulatory Problems    Diagnosis Date Noted  . Hypercalcemia 07/15/2020  . Hypertension   . COPD (chronic obstructive pulmonary disease) ([place]) 04/23/2020  . GERD (gastroesophageal reflux disease) 07/15/2020  . Hyperparathyroidism ([place]) 05/31/2020  . Left hemiplegia ([place]) 04/29/2020  . Non-rheumatic mitral valve stenosis 12/09/2017  . Paroxysmal A-fib ([place]) 04/23/2020  . Pulmonary hypertension ([place]) 04/23/2020  . S/P mitral valve replacement with metallic valve 89/37/3428  . 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 Warfarin anticoagulation 05/15/2020  . Gastric ulcer 07/15/2020  . Gastrojejunostomy tube status ([place]) 05/31/2020  . 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 Thrombocytosis 05/06/2020  . Hypokalemia   . Hyponatremia   . Normocytic anemia   . Hypomagnesemia 07/16/2020  . Hypophosphatemia 07/16/2020  . Malfunction of percutaneous endoscopic gastrostomy (PEG) tube ([place])   . Goals of care, counseling/discussion   . Palliative care by specialist   . 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 Chronic knee pain 08/27/2020  . Hypercholesterolemia 04/23/2020  . On amiodarone therapy 07/29/2020  . On enteral nutrition 04/29/2020  . Hypotension 09/19/2020  . Dehydration 09/19/2020  . AKI (acute kidney injury) ([place]) 09/19/2020  . Acute renal failure (ARF) ([place]) 09/19/2020  . Refeeding syndrome 09/21/2020  . Symptomatic anemia 09/21/2020   Resolved Ambulatory Problems    Diagnosis Date Noted  . No Resolved Ambulatory Problems   Past Medical History:  Diagnosis Date  . High cholesterol   . Stroke [facility])    SOCIAL HX:  Social History   Tobacco Use  . Smoking status: Never Smoker  . Smokeless tobacco: Never Used  Substance Use Topics  . Alcohol use: Never   FAMILY HX:  Family History  Problem Relation Age of Onset  . Hypertension Mother   . Stroke Father   . Hypertension Father   .  Cancer - Colon Brother   . AAA (abdominal aortic aneurysm) Brother   . Heart disease Daughter       ALLERGIES:  Allergies  Allergen Reactions  . Oxycodone Nausea Only     PERTINENT MEDICATIONS:  Outpatient Encounter Medications as of 10/07/2020  Medication Sig  . acetaminophen (TYLENOL) 325 MG tablet Place 1 tablet (325 mg total) into feeding tube every 6 (six) hours as needed for mild pain.  Marland Kitchen amiodarone (PACERONE) 200 MG tablet Place 1 tablet (200 mg total) into feeding tube daily.  Marland Kitchen aspirin 81 MG EC tablet Take 81 mg by mouth daily.  Marland Kitchen atorvastatin (LIPITOR) 40 MG tablet Take 40 mg by mouth daily.  . Cholecalciferol 50 MCG (2000 UT) TABS Take by mouth.  . diclofenac Sodium (VOLTAREN) 1 % GEL Apply topically.  . mirtazapine (REMERON) 30 MG tablet Take 30 mg by mouth at bedtime.  . Multiple Vitamin (MULTIVITAMIN WITH MINERALS) TABS tablet Place 1 tablet into feeding tube daily.  . Nutritional Supplements (FEEDING SUPPLEMENT, OSMOLITE 1.5  CAL,) LIQD Place 1,000 mLs into feeding tube continuous.  . pantoprazole (PROTONIX) 40 MG tablet Take 40 mg by mouth daily.  . potassium & sodium phosphates (PHOS-NAK) 280-160-250 MG PACK Place 1 packet into feeding tube 4 (four) times daily -  with meals and at bedtime.  . senna-docusate (SENOKOT-S) 8.6-50 MG tablet Take 1 tablet by mouth 2 (two) times daily.  . sodium bicarbonate 650 MG tablet Take 1 tablet (650 mg total) by mouth 2 (two) times daily.  . traZODone (DESYREL) 50 MG tablet Take 50 mg by mouth daily.  Marland Kitchen warfarin (COUMADIN) 2 MG tablet Take 2 mg by mouth daily.   No facility-administered encounter medications on file as of 10/07/2020.    Objective: ROS  General:tearful EYES: vision change L eye ENMT: denies dysphagia Cardiovascular: denies chest pain Pulmonary: denies cough, denies increased SOB Abdomen: endorses poor appetite, denies constipation, endorses                            incontinence of bowel, endorses n/v. GU:  denies dysuria, endorses incontinence of urine MSK:  endorses ROM limitations, L hemiparesis, no falls reported Skin: heel breakdown, some sheer on sacrum Neurological: endorses increased weakness, endorses  pain, endorses  insomnia Psych: Endorses down mood Heme/lymph/immuno: denies bruises, abnormal bleeding  Physical Exam: Current and past weights:unavailable Constitutional: 120/56 HR 98 RR 22 General: frail appearing, thin/WNWD/obese  EYES: anicteric sclera, lids intact, no discharge  ENMT: intact hearing,oral mucous membranes moist, dentition intact CV: S1S2, RRR, bounding HR, no LE edema Pulmonary:no increased work of breathing, no cough, no audible wheezes, room air Abdomen: intake 25-50%, no ascites, g tube GU: deferred MSK: severe sarcopenia, decreased ROM in all extremities, L hemiparesis,  non ambulatory Skin: warm and dry, + wounds on heels, sacrum Neuro: Generalized weakness, mild to mod cognitive impairment Psych: depressed and tearful affect, A and O x 2 Hem/lymph/immuno: no widespread bruising   Thank you for the opportunity to participate in the care of Annette Rogers.  The palliative care team will continue to follow. Please call our office at [phone] if we can be of additional assistance.  Jason Coop, NP , DNP, MPH, AGPCNP-BC, ACHPN  COVID-19 PATIENT SCREENING TOOL  Person answering questions: ____________daughter______ _____   1.  Is the patient or any family member in the home showing any signs or symptoms regarding respiratory infection?               Person with Symptom- __________NA_________________  a. Fever                                                                          Yes___ No___          ___________________  b. Shortness of breath                                                    Yes___ No___          ___________________ c. Cough/congestion  Yes___  No___          ___________________ d. Body aches/pains                                                         Yes___ No___        ____________________ e. Gastrointestinal symptoms (diarrhea, nausea)           Yes___ No___        ____________________  2. Within the past 14 days, has anyone living in the home had any contact with someone with or under investigation for COVID-19?    Yes___ No_X_   Person __________________

## 2020-10-08 ENCOUNTER — Telehealth: Payer: Self-pay | Admitting: Primary Care

## 2020-10-08 NOTE — Telephone Encounter (Signed)
Telephone call to DSS adult protective services in [facility] and spoke with caseworker Lupita Leash. Currently Annette Rogers has an opened APS case file. I wanted to share some of my findings from my visit yesterday and shared my concerns about her lack of being fed oral and per tube, and her medication omissions. In addition I voiced some concerned about her being on a medicine that requires a good deal of monitoring, which does not appear to be in done currently by the family. Family stated multiple times they wanted her feeding tube out and wanted her to eat by mouth. I will fax this note to her PCP as well to apprise her of my concerns. According to patient's clinical exam yesterday I do have some concerns about her well-being which I passed on to the DSS caseworker.

## 2020-10-14 ENCOUNTER — Emergency Department (HOSPITAL_COMMUNITY): Payer: Medicare Other

## 2020-10-14 ENCOUNTER — Inpatient Hospital Stay (HOSPITAL_COMMUNITY)
Admission: EM | Admit: 2020-10-14 | Discharge: 2020-11-12 | DRG: 871 | Disposition: E | Payer: Medicare Other | Attending: Internal Medicine | Admitting: Internal Medicine

## 2020-10-14 ENCOUNTER — Other Ambulatory Visit: Payer: Self-pay

## 2020-10-14 ENCOUNTER — Encounter (HOSPITAL_COMMUNITY): Payer: Self-pay

## 2020-10-14 DIAGNOSIS — E876 Hypokalemia: Secondary | ICD-10-CM | POA: Diagnosis present

## 2020-10-14 DIAGNOSIS — Z7901 Long term (current) use of anticoagulants: Secondary | ICD-10-CM | POA: Diagnosis not present

## 2020-10-14 DIAGNOSIS — R6521 Severe sepsis with septic shock: Secondary | ICD-10-CM | POA: Diagnosis present

## 2020-10-14 DIAGNOSIS — U071 COVID-19: Secondary | ICD-10-CM | POA: Diagnosis present

## 2020-10-14 DIAGNOSIS — E860 Dehydration: Secondary | ICD-10-CM | POA: Diagnosis present

## 2020-10-14 DIAGNOSIS — R1915 Other abnormal bowel sounds: Secondary | ICD-10-CM

## 2020-10-14 DIAGNOSIS — A0839 Other viral enteritis: Secondary | ICD-10-CM | POA: Diagnosis present

## 2020-10-14 DIAGNOSIS — D62 Acute posthemorrhagic anemia: Secondary | ICD-10-CM | POA: Diagnosis present

## 2020-10-14 DIAGNOSIS — I959 Hypotension, unspecified: Secondary | ICD-10-CM | POA: Diagnosis present

## 2020-10-14 DIAGNOSIS — Z90710 Acquired absence of both cervix and uterus: Secondary | ICD-10-CM

## 2020-10-14 DIAGNOSIS — R7989 Other specified abnormal findings of blood chemistry: Secondary | ICD-10-CM

## 2020-10-14 DIAGNOSIS — E872 Acidosis, unspecified: Secondary | ICD-10-CM

## 2020-10-14 DIAGNOSIS — K922 Gastrointestinal hemorrhage, unspecified: Secondary | ICD-10-CM

## 2020-10-14 DIAGNOSIS — N390 Urinary tract infection, site not specified: Secondary | ICD-10-CM | POA: Diagnosis present

## 2020-10-14 DIAGNOSIS — R791 Abnormal coagulation profile: Secondary | ICD-10-CM | POA: Diagnosis present

## 2020-10-14 DIAGNOSIS — N179 Acute kidney failure, unspecified: Secondary | ICD-10-CM | POA: Diagnosis present

## 2020-10-14 DIAGNOSIS — A4189 Other specified sepsis: Secondary | ICD-10-CM | POA: Diagnosis present

## 2020-10-14 DIAGNOSIS — K760 Fatty (change of) liver, not elsewhere classified: Secondary | ICD-10-CM | POA: Diagnosis present

## 2020-10-14 DIAGNOSIS — R9431 Abnormal electrocardiogram [ECG] [EKG]: Secondary | ICD-10-CM

## 2020-10-14 DIAGNOSIS — I10 Essential (primary) hypertension: Secondary | ICD-10-CM | POA: Diagnosis present

## 2020-10-14 DIAGNOSIS — E213 Hyperparathyroidism, unspecified: Secondary | ICD-10-CM | POA: Diagnosis present

## 2020-10-14 DIAGNOSIS — Z951 Presence of aortocoronary bypass graft: Secondary | ICD-10-CM

## 2020-10-14 DIAGNOSIS — J449 Chronic obstructive pulmonary disease, unspecified: Secondary | ICD-10-CM | POA: Diagnosis present

## 2020-10-14 DIAGNOSIS — R112 Nausea with vomiting, unspecified: Secondary | ICD-10-CM | POA: Diagnosis not present

## 2020-10-14 DIAGNOSIS — R1030 Lower abdominal pain, unspecified: Secondary | ICD-10-CM | POA: Diagnosis not present

## 2020-10-14 DIAGNOSIS — I69354 Hemiplegia and hemiparesis following cerebral infarction affecting left non-dominant side: Secondary | ICD-10-CM | POA: Diagnosis not present

## 2020-10-14 DIAGNOSIS — Z931 Gastrostomy status: Secondary | ICD-10-CM

## 2020-10-14 DIAGNOSIS — E162 Hypoglycemia, unspecified: Secondary | ICD-10-CM | POA: Diagnosis present

## 2020-10-14 DIAGNOSIS — J9601 Acute respiratory failure with hypoxia: Secondary | ICD-10-CM | POA: Diagnosis present

## 2020-10-14 DIAGNOSIS — Z79899 Other long term (current) drug therapy: Secondary | ICD-10-CM

## 2020-10-14 DIAGNOSIS — Z952 Presence of prosthetic heart valve: Secondary | ICD-10-CM | POA: Diagnosis not present

## 2020-10-14 DIAGNOSIS — E7800 Pure hypercholesterolemia, unspecified: Secondary | ICD-10-CM | POA: Diagnosis present

## 2020-10-14 DIAGNOSIS — K5900 Constipation, unspecified: Secondary | ICD-10-CM | POA: Diagnosis present

## 2020-10-14 DIAGNOSIS — Z7982 Long term (current) use of aspirin: Secondary | ICD-10-CM

## 2020-10-14 DIAGNOSIS — Z934 Other artificial openings of gastrointestinal tract status: Secondary | ICD-10-CM

## 2020-10-14 DIAGNOSIS — R079 Chest pain, unspecified: Secondary | ICD-10-CM

## 2020-10-14 DIAGNOSIS — A419 Sepsis, unspecified organism: Secondary | ICD-10-CM

## 2020-10-14 DIAGNOSIS — R109 Unspecified abdominal pain: Secondary | ICD-10-CM

## 2020-10-14 DIAGNOSIS — K219 Gastro-esophageal reflux disease without esophagitis: Secondary | ICD-10-CM | POA: Diagnosis present

## 2020-10-14 DIAGNOSIS — I468 Cardiac arrest due to other underlying condition: Secondary | ICD-10-CM | POA: Diagnosis not present

## 2020-10-14 DIAGNOSIS — R17 Unspecified jaundice: Secondary | ICD-10-CM

## 2020-10-14 DIAGNOSIS — G9341 Metabolic encephalopathy: Secondary | ICD-10-CM | POA: Diagnosis present

## 2020-10-14 DIAGNOSIS — A0472 Enterocolitis due to Clostridium difficile, not specified as recurrent: Secondary | ICD-10-CM

## 2020-10-14 DIAGNOSIS — Z452 Encounter for adjustment and management of vascular access device: Secondary | ICD-10-CM

## 2020-10-14 DIAGNOSIS — R4182 Altered mental status, unspecified: Secondary | ICD-10-CM | POA: Diagnosis present

## 2020-10-14 DIAGNOSIS — I469 Cardiac arrest, cause unspecified: Secondary | ICD-10-CM | POA: Diagnosis not present

## 2020-10-14 DIAGNOSIS — D649 Anemia, unspecified: Secondary | ICD-10-CM | POA: Diagnosis not present

## 2020-10-14 DIAGNOSIS — D509 Iron deficiency anemia, unspecified: Secondary | ICD-10-CM | POA: Diagnosis present

## 2020-10-14 DIAGNOSIS — E785 Hyperlipidemia, unspecified: Secondary | ICD-10-CM | POA: Diagnosis present

## 2020-10-14 DIAGNOSIS — K8020 Calculus of gallbladder without cholecystitis without obstruction: Secondary | ICD-10-CM | POA: Diagnosis present

## 2020-10-14 DIAGNOSIS — I480 Paroxysmal atrial fibrillation: Secondary | ICD-10-CM | POA: Diagnosis present

## 2020-10-14 LAB — LACTIC ACID, PLASMA
Lactic Acid, Venous: 3.8 mmol/L (ref 0.5–1.9)
Lactic Acid, Venous: 6.6 mmol/L (ref 0.5–1.9)
Lactic Acid, Venous: 6.6 mmol/L (ref 0.5–1.9)
Lactic Acid, Venous: 5.8 mmol/L (ref 0.5–1.9)

## 2020-10-14 LAB — CBC
HCT: 23.8 % — ABNORMAL LOW (ref 36.0–46.0)
HCT: 19.3 % — ABNORMAL LOW (ref 36.0–46.0)
Hemoglobin: 7.8 g/dL — ABNORMAL LOW (ref 12.0–15.0)
Hemoglobin: 6.6 g/dL — CL (ref 12.0–15.0)
MCH: 26.1 pg (ref 26.0–34.0)
MCH: 26.5 pg (ref 26.0–34.0)
MCHC: 32.8 g/dL (ref 30.0–36.0)
MCHC: 34.2 g/dL (ref 30.0–36.0)
MCV: 79.6 fL — ABNORMAL LOW (ref 80.0–100.0)
MCV: 77.5 fL — ABNORMAL LOW (ref 80.0–100.0)
Platelets: 258 10*3/uL (ref 150–400)
Platelets: 233 10*3/uL (ref 150–400)
RBC: 2.99 MIL/uL — ABNORMAL LOW (ref 3.87–5.11)
RBC: 2.49 MIL/uL — ABNORMAL LOW (ref 3.87–5.11)
RDW: 21.8 % — ABNORMAL HIGH (ref 11.5–15.5)
RDW: 21.9 % — ABNORMAL HIGH (ref 11.5–15.5)
WBC: 8.8 10*3/uL (ref 4.0–10.5)
WBC: 11 10*3/uL — ABNORMAL HIGH (ref 4.0–10.5)
nRBC: 0.2 % (ref 0.0–0.2)
nRBC: 0 % (ref 0.0–0.2)

## 2020-10-14 LAB — SARS CORONAVIRUS 2 BY RT PCR (HOSPITAL ORDER, PERFORMED IN ~~LOC~~ HOSPITAL LAB): SARS Coronavirus 2: POSITIVE — AB

## 2020-10-14 LAB — CBG MONITORING, ED
Glucose-Capillary: 105 mg/dL — ABNORMAL HIGH (ref 70–99)
Glucose-Capillary: 70 mg/dL (ref 70–99)
Glucose-Capillary: 60 mg/dL — ABNORMAL LOW (ref 70–99)
Glucose-Capillary: 405 mg/dL — ABNORMAL HIGH (ref 70–99)
Glucose-Capillary: 345 mg/dL — ABNORMAL HIGH (ref 70–99)

## 2020-10-14 LAB — BASIC METABOLIC PANEL
Anion gap: 15 (ref 5–15)
Anion gap: 12 (ref 5–15)
BUN: 21 mg/dL (ref 8–23)
BUN: 18 mg/dL (ref 8–23)
CO2: 22 mmol/L (ref 22–32)
CO2: 16 mmol/L — ABNORMAL LOW (ref 22–32)
Calcium: 7 mg/dL — ABNORMAL LOW (ref 8.9–10.3)
Calcium: 6.3 mg/dL — CL (ref 8.9–10.3)
Chloride: 100 mmol/L (ref 98–111)
Chloride: 109 mmol/L (ref 98–111)
Creatinine, Ser: 1.56 mg/dL — ABNORMAL HIGH (ref 0.44–1.00)
Creatinine, Ser: 1.47 mg/dL — ABNORMAL HIGH (ref 0.44–1.00)
GFR, Estimated: 36 mL/min — ABNORMAL LOW (ref >60)
GFR, Estimated: 39 mL/min — ABNORMAL LOW (ref >60)
Glucose, Bld: 362 mg/dL — ABNORMAL HIGH (ref 70–99)
Glucose, Bld: 210 mg/dL — ABNORMAL HIGH (ref 70–99)
Potassium: 2.4 mmol/L — CL (ref 3.5–5.1)
Potassium: 2.4 mmol/L — CL (ref 3.5–5.1)
Sodium: 137 mmol/L (ref 135–145)
Sodium: 137 mmol/L (ref 135–145)

## 2020-10-14 LAB — POC OCCULT BLOOD, ED: Fecal Occult Bld: POSITIVE — AB

## 2020-10-14 LAB — PREPARE RBC (CROSSMATCH)

## 2020-10-14 LAB — URINALYSIS, ROUTINE W REFLEX MICROSCOPIC
Bilirubin Urine: NEGATIVE
Glucose, UA: >=500 mg/dL — AB
Ketones, ur: 5 mg/dL — AB
Nitrite: NEGATIVE
Protein, ur: 100 mg/dL — AB
Specific Gravity, Urine: 1.009 (ref 1.005–1.030)
WBC, UA: >50 WBC/hpf — ABNORMAL HIGH (ref 0–5)
pH: 5 (ref 5.0–8.0)

## 2020-10-14 LAB — C DIFFICILE QUICK SCREEN W PCR REFLEX
C Diff antigen: POSITIVE — AB
C Diff toxin: NEGATIVE

## 2020-10-14 LAB — MAGNESIUM: Magnesium: 1.2 mg/dL — ABNORMAL LOW (ref 1.7–2.4)

## 2020-10-14 LAB — CLOSTRIDIUM DIFFICILE BY PCR, REFLEXED: Toxigenic C. Difficile by PCR: POSITIVE — AB

## 2020-10-14 LAB — PROTIME-INR
INR: 2 — ABNORMAL HIGH (ref 0.8–1.2)
Prothrombin Time: 22.2 seconds — ABNORMAL HIGH (ref 11.4–15.2)

## 2020-10-14 LAB — LACTATE DEHYDROGENASE: LDH: 541 U/L — ABNORMAL HIGH (ref 98–192)

## 2020-10-14 MED ORDER — MAGNESIUM SULFATE 2 GM/50ML IV SOLN
2.0000 g | Freq: Once | INTRAVENOUS | Status: AC
  Administered 2020-10-15: 2 g via INTRAVENOUS
  Filled 2020-10-14: qty 50

## 2020-10-14 MED ORDER — SODIUM CHLORIDE 0.9 % IV BOLUS (SEPSIS)
250.0000 mL | Freq: Once | INTRAVENOUS | Status: AC
  Administered 2020-10-14: 250 mL via INTRAVENOUS

## 2020-10-14 MED ORDER — POTASSIUM CHLORIDE 10 MEQ/100ML IV SOLN
10.0000 meq | INTRAVENOUS | Status: AC
  Administered 2020-10-15 (×2): 10 meq via INTRAVENOUS
  Filled 2020-10-14 (×2): qty 100

## 2020-10-14 MED ORDER — VANCOMYCIN HCL 1250 MG/250ML IV SOLN: 1250.0000 mg | INTRAVENOUS | Status: DC

## 2020-10-14 MED ORDER — DEXTROSE 50 % IV SOLN
1.0000 | Freq: Once | INTRAVENOUS | Status: DC
  Filled 2020-10-14: qty 50

## 2020-10-14 MED ORDER — DEXTROSE 10 % IV SOLN: INTRAVENOUS | Status: DC

## 2020-10-14 MED ORDER — SODIUM CHLORIDE 0.9 % IV SOLN
2.0000 g | Freq: Once | INTRAVENOUS | Status: AC
  Administered 2020-10-14: 2 g via INTRAVENOUS
  Filled 2020-10-14: qty 2

## 2020-10-14 MED ORDER — DEXTROSE 50 % IV SOLN
INTRAVENOUS | Status: AC
  Administered 2020-10-14: 1 via INTRAVENOUS
  Filled 2020-10-14: qty 50

## 2020-10-14 MED ORDER — VANCOMYCIN HCL IN DEXTROSE 1-5 GM/200ML-% IV SOLN: 1000.0000 mg | Freq: Once | INTRAVENOUS | Status: DC

## 2020-10-14 MED ORDER — SODIUM CHLORIDE 0.9% FLUSH: 3.0000 mL | INTRAVENOUS | Status: DC | PRN

## 2020-10-14 MED ORDER — SODIUM CHLORIDE 0.9 % IV SOLN
1.0000 g | Freq: Once | INTRAVENOUS | Status: AC
  Administered 2020-10-14: 1 g via INTRAVENOUS
  Filled 2020-10-14 (×2): qty 10

## 2020-10-14 MED ORDER — DEXTROSE 50 % IV SOLN: 1.0000 | Freq: Once | INTRAVENOUS | Status: AC

## 2020-10-14 MED ORDER — POTASSIUM CHLORIDE 10 MEQ/100ML IV SOLN
10.0000 meq | Freq: Once | INTRAVENOUS | Status: AC
  Administered 2020-10-14: 10 meq via INTRAVENOUS
  Filled 2020-10-14: qty 100

## 2020-10-14 MED ORDER — SODIUM CHLORIDE 0.9 % IV BOLUS (SEPSIS)
1000.0000 mL | Freq: Once | INTRAVENOUS | Status: AC
  Administered 2020-10-14: 1000 mL via INTRAVENOUS

## 2020-10-14 MED ORDER — VANCOMYCIN 50 MG/ML ORAL SOLUTION
125.0000 mg | Freq: Four times a day (QID) | ORAL | Status: DC
  Administered 2020-10-15: 125 mg via ORAL
  Filled 2020-10-14 (×8): qty 2.5

## 2020-10-14 MED ORDER — SODIUM CHLORIDE 0.9 % IV BOLUS
1000.0000 mL | Freq: Once | INTRAVENOUS | Status: AC
  Administered 2020-10-14: 1000 mL via INTRAVENOUS

## 2020-10-14 MED ORDER — SODIUM CHLORIDE 0.9% FLUSH
3.0000 mL | Freq: Two times a day (BID) | INTRAVENOUS | Status: DC
  Administered 2020-10-15: 3 mL via INTRAVENOUS

## 2020-10-14 MED ORDER — SODIUM CHLORIDE 0.9 % IV SOLN
250.0000 mL | INTRAVENOUS | Status: DC | PRN
  Administered 2020-10-14: 250 mL via INTRAVENOUS

## 2020-10-14 MED ORDER — METRONIDAZOLE IN NACL 5-0.79 MG/ML-% IV SOLN
500.0000 mg | Freq: Once | INTRAVENOUS | Status: AC
  Administered 2020-10-14: 500 mg via INTRAVENOUS
  Filled 2020-10-14: qty 100

## 2020-10-14 MED ORDER — SODIUM CHLORIDE 0.9% IV SOLUTION: Freq: Once | INTRAVENOUS | Status: AC

## 2020-10-14 MED ORDER — SODIUM CHLORIDE 0.9 % IV SOLN
2.0000 g | Freq: Two times a day (BID) | INTRAVENOUS | Status: DC
  Administered 2020-10-15: 2 g via INTRAVENOUS
  Filled 2020-10-14: qty 2

## 2020-10-14 MED ORDER — POTASSIUM CHLORIDE 20 MEQ PO PACK
40.0000 meq | PACK | Freq: Once | ORAL | Status: AC
  Administered 2020-10-14: 40 meq via ORAL
  Filled 2020-10-14: qty 2

## 2020-10-14 MED ORDER — DEXTROSE 50 % IV SOLN
1.0000 | Freq: Once | INTRAVENOUS | Status: AC
  Administered 2020-10-14: 50 mL via INTRAVENOUS

## 2020-10-14 MED ORDER — SODIUM CHLORIDE 0.9 % IV SOLN: INTRAVENOUS | Status: AC

## 2020-10-14 MED ORDER — VANCOMYCIN HCL 1500 MG/300ML IV SOLN
1500.0000 mg | Freq: Once | INTRAVENOUS | Status: AC
  Administered 2020-10-14: 1500 mg via INTRAVENOUS
  Filled 2020-10-14: qty 300

## 2020-10-14 MED ORDER — LACTATED RINGERS IV BOLUS
1000.0000 mL | Freq: Once | INTRAVENOUS | Status: AC
  Administered 2020-10-14: 1000 mL via INTRAVENOUS

## 2020-10-14 NOTE — ED Notes (Signed)
Hgb= 6.6 Potassium= 2.4 Calcium= 6.3 Lactic Acid = 5.8 All values reported to Dr Thomes Dinning at this time. New orders to be placed.

## 2020-10-14 NOTE — Progress Notes (Signed)
Notified bedside nurse of need to draw blood cultures.  

## 2020-10-14 NOTE — ED Notes (Signed)
Bladder scan completed, urine noted.

## 2020-10-14 NOTE — Progress Notes (Signed)
Notified bedside nurse of need to draw repeat lactic acid, check with lab to see if they drew it.

## 2020-10-14 NOTE — Progress Notes (Signed)
Following for code sepsis 

## 2020-10-14 NOTE — Procedures (Signed)
Procedure Note  10/12/2020    Preoperative Diagnosis: Hypoglycemia, hypokalemia, COVID and acute kidney injury   Postoperative Diagnosis: Same   Procedure(s) Performed: Central Line placement, left internal jugular    Surgeon: Leatrice Jewels. Henreitta Leber, MD   Assistants: None   Anesthesia: 1% lidocaine    Complications: None    Indications: Ms. Yordy is a 65 y.o. with COVID, hypoglycemia, hypokalemia and acute kidney injury who presented to the hospital and has been getting resuscitation. She has been having significant diarrhea and she has poor IV access. I discussed the risk and benefits of placement of the central line with her, including but not limited to bleeding, infection, and risk of pneumothorax. She has given consent for the procedure.  Due to her diarrhea we want to avoid a femoral line, and she does have an elevated INR on coumadin so we discussed an internal jugular placement with ultrasound, and will place on her left side due to her hemiparesis on this side.   Procedure: The patient placed supine. The left chest and neck was prepped and draped in the usual sterile fashion.  Wearing full gown and gloves, I performed the procedure.  One percent lidocaine was used for local anesthesia. An ultrasound was utilized to assess the jugular vein.  The needle with syringe was advanced into the vein with dark venous return, and a wire was placed using the Seldinger technique without difficulty.  Ectopia was not noted.  The skin was knicked and a dilator was placed, and the three lumen catheter was placed over the wire with continued control of the wire.  There was good draw back of blood from all three lumens and each flushed easily with saline.  The catheter was secured in 4 points with 2-0 silk and a biopatch and dressing was placed.     The patient tolerated the procedure well, and the CXR was ordered to confirm position of the central line.   Algis Greenhouse, MD [facility] [address] [place], [place] [address] [phone] (office)

## 2020-10-14 NOTE — ED Provider Notes (Signed)
[facility] EMERGENCY DEPARTMENT Provider Note   CSN: [id number] Arrival date & time: October 31, 2020  1129  LEVEL 5 CAVEAT - ALTERED MENTAL STATUS   History Chief Complaint  Patient presents with  . Hypoglycemia    Annette Rogers is a 65 y.o. female.  HPI 65 year old female presents from home via EMS.  Her history is very limited and only obtained from EMS at this point.  Patient is altered and her glucose is quite low.  EMS notes originally was around 28 and they were unable to get IV access or get her to swallow anything.  It went down to 20 and I was called because when our nursing staff checked it was 12.  Further history is unavailable.  Past Medical History:  Diagnosis Date  . COPD (chronic obstructive pulmonary disease) (HCC) 04/23/2020  . Gastrojejunostomy tube status (HCC) 05/31/2020  . GERD (gastroesophageal reflux disease) 07/15/2020  . High cholesterol   . Hypercalcemia 07/15/2020  . Hyperparathyroidism (HCC) 05/31/2020  . Hypertension   . Left hemiplegia (HCC) 04/29/2020  . Non-rheumatic mitral valve stenosis 12/09/2017  . Paroxysmal A-fib (HCC) 04/23/2020  . S/P mitral valve replacement with metallic valve 04/29/2020   Formatting of this note might be different from the original. [employer]. Jude regent mechanical  Procedures:   1. Mitral valve replacement (25 mm [employer] Regent) (CPT [phone]) 2. Tricuspid valve repair (annuloplasty only)  (30  mm CE Physio ring) (CPT 33464) 4. 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 Procedures:   1. Mitral valve replacement (25 mm [employer] Regent) (CPT [phone]) 2. Tricuspid valve repair (annuloplasty only)  (30  mm CE Physio ring) (CPT 33464) 4. 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 Stroke [facility])     Patient Active Problem List   Diagnosis Date Noted  . Refeeding syndrome 09/21/2020  . Symptomatic anemia 09/21/2020  .  Hypotension 09/19/2020  . Dehydration 09/19/2020  . AKI (acute kidney injury) (HCC) 09/19/2020  . Acute renal failure (ARF) (HCC) 09/19/2020  . Chronic knee pain 08/27/2020  . 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 On amiodarone therapy 07/29/2020  . Malfunction of percutaneous endoscopic gastrostomy (PEG) tube (HCC)   . Goals of care, counseling/discussion   . Palliative care by specialist   . Hypomagnesemia 07/16/2020  . Hypophosphatemia 07/16/2020  . Hypercalcemia 07/15/2020  . GERD (gastroesophageal reflux disease) 07/15/2020  . Gastric ulcer 07/15/2020  . Hypertension   . Hypokalemia   . Hyponatremia   . Normocytic anemia   . Hyperparathyroidism (HCC) 05/31/2020  . Gastrojejunostomy tube status (HCC) 05/31/2020  . Warfarin anticoagulation 05/15/2020  . Thrombocytosis 05/06/2020  . Left hemiplegia (HCC) 04/29/2020  . S/P mitral valve replacement with metallic valve 04/29/2020  . On enteral nutrition 04/29/2020  . 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 COPD (chronic obstructive pulmonary disease) (HCC) 04/23/2020  . Paroxysmal A-fib (HCC) 04/23/2020  . Pulmonary hypertension (HCC) 04/23/2020  . Hypercholesterolemia 04/23/2020  . Non-rheumatic mitral valve stenosis 12/09/2017    Past Surgical History:  Procedure Laterality Date  . ABDOMINAL HYSTERECTOMY    . CORONARY ARTERY BYPASS GRAFT    . GASTROSTOMY W/ FEEDING TUBE    . IR GJ TUBE CHANGE  07/22/2020  . JOINT REPLACEMENT       OB History   No obstetric history  on file.     Family History  Problem Relation Age of Onset  . Hypertension Mother   . Stroke Father   . Hypertension Father   . Cancer - Colon Brother   . AAA (abdominal aortic aneurysm) Brother   . Heart disease Daughter     Social History   Tobacco Use  . Smoking status: Never Smoker  . Smokeless tobacco: Never Used  Substance Use Topics  . Alcohol use: Never   . Drug use: Never    Home Medications Prior to Admission medications   Medication Sig Start Date End Date Taking? Authorizing Provider  acetaminophen (TYLENOL) 325 MG tablet Place 1 tablet (325 mg total) into feeding tube every 6 (six) hours as needed for mild pain. 09/23/20   Johnson, Clanford L, MD  amiodarone (PACERONE) 200 MG tablet Place 1 tablet (200 mg total) into feeding tube daily. 09/23/20 09/23/21  Cleora Fleet, MD  aspirin 81 MG EC tablet Take 81 mg by mouth daily. 06/21/20   [provider]  atorvastatin (LIPITOR) 40 MG tablet Take 40 mg by mouth daily. 06/21/20 06/21/21  [provider]  Cholecalciferol 50 MCG (2000 UT) TABS Take by mouth. 06/21/20 06/21/21  [provider]  diclofenac Sodium (VOLTAREN) 1 % GEL Apply topically. 06/21/20 06/21/21  [provider]  mirtazapine (REMERON) 30 MG tablet Take 30 mg by mouth at bedtime. 07/10/20   [provider]  Multiple Vitamin (MULTIVITAMIN WITH MINERALS) TABS tablet Place 1 tablet into feeding tube daily. 07/25/20   Johnson, Clanford L, MD  Nutritional Supplements (FEEDING SUPPLEMENT, OSMOLITE 1.5 CAL,) LIQD Place 1,000 mLs into feeding tube continuous. 07/24/20   Johnson, Clanford L, MD  pantoprazole (PROTONIX) 40 MG tablet Take 40 mg by mouth daily.    [provider]  potassium & sodium phosphates (PHOS-NAK) 280-160-250 MG PACK Place 1 packet into feeding tube 4 (four) times daily -  with meals and at bedtime. 09/23/20   Johnson, Clanford L, MD  senna-docusate (SENOKOT-S) 8.6-50 MG tablet Take 1 tablet by mouth 2 (two) times daily. 07/24/20   Johnson, Clanford L, MD  sodium bicarbonate 650 MG tablet Take 1 tablet (650 mg total) by mouth 2 (two) times daily. 07/24/20   Johnson, Clanford L, MD  traZODone (DESYREL) 50 MG tablet Take 50 mg by mouth daily. 08/26/20   [provider]  warfarin (COUMADIN) 2 MG tablet Take 2 mg by mouth daily. 06/21/20 06/21/21  [provider]    Allergies    Oxycodone  Review of Systems   Review of Systems  Unable to perform ROS: Mental status change    Physical Exam Updated Vital Signs Ht 5' (1.524 m)   Wt 67 kg   SpO2 94%   BMI 28.85 kg/m   Physical Exam Vitals and nursing note reviewed.  Constitutional:      Appearance: She is well-developed and well-nourished.  HENT:     Head: Normocephalic and atraumatic.     Right Ear: External ear normal.     Left Ear: External ear normal.     Nose: Nose normal.  Eyes:     General:        Right eye: No discharge.        Left eye: No discharge.  Cardiovascular:     Rate and Rhythm: Normal rate and regular rhythm.     Heart sounds: Normal heart sounds.  Pulmonary:     Effort: Pulmonary effort is normal.  Breath sounds: Normal breath sounds.  Abdominal:     Palpations: Abdomen is soft.     Tenderness: There is no abdominal tenderness.  Skin:    General: Skin is warm and dry.  Neurological:     Mental Status: She is lethargic.     Comments: Left sided hemiparesis  Psychiatric:        Mood and Affect: Mood is not anxious.     ED Results / Procedures / Treatments   Labs (all labs ordered are listed, but only abnormal results are displayed) Labs Reviewed  CBG MONITORING, ED - Abnormal; Notable for the following components:      Result Value   Glucose-Capillary 105 (*)    All other components within normal limits  BASIC METABOLIC PANEL  CBC  URINALYSIS, ROUTINE W REFLEX MICROSCOPIC  CBG MONITORING, ED    EKG None  Radiology No results found.  Procedures Ultrasound ED Peripheral IV (Provider)  Date/Time: 17-Oct-2020 11:54 AM Performed by: Pricilla Loveless, MD Authorized by: Pricilla Loveless, MD   Procedure details:    Indications: poor IV access     Skin Prep: chlorhexidine gluconate     Location:  Right AC   Angiocath:  20 G   Bedside Ultrasound Guided: Yes     Patient tolerated procedure without complications: Yes      Dressing applied: Yes    .Critical Care Performed by: Pricilla Loveless, MD Authorized by: Pricilla Loveless, MD   Critical care provider statement:    Critical care time (minutes):  45   Critical care time was exclusive of:  Separately billable procedures and treating other patients   Critical care was necessary to treat or prevent imminent or life-threatening deterioration of the following conditions:  Endocrine crisis and CNS failure or compromise   Critical care was time spent personally by me on the following activities:  Discussions with consultants, evaluation of patient's response to treatment, examination of patient, ordering and performing treatments and interventions, ordering and review of laboratory studies, ordering and review of radiographic studies, pulse oximetry, re-evaluation of patient's condition, obtaining history from patient or surrogate and review of old charts     Medications Ordered in ED Medications  sodium chloride flush (NS) 0.9 % injection 3 mL (has no administration in time range)  sodium chloride flush (NS) 0.9 % injection 3 mL (has no administration in time range)  0.9 %  sodium chloride infusion (has no administration in time range)  dextrose 50 % solution 50 mL (has no administration in time range)    ED Course  I have reviewed the triage vital signs and the nursing notes.  Pertinent labs & imaging results that were available during my care of the patient were reviewed by me and considered in my medical decision making (see chart for details).    MDM Rules/Calculators/A&P                          Patient with severe hypoglycemia. Fortunately after IV access we were able to give her D50 and now she is more alert.  She required a second dose of D50 and we hung D10.  Now she is hyperglycemic.  We will hold the D10.  It looks like she has a UTI which will be given Rocephin.  She also has a lactic acid that is elevated, unclear if this is from the severe  hypoglycemia versus sepsis.  She will be given 2 L IV fluids for  soft pressures.  I am unable to get in contact with emergency contact and is unclear what her baseline mental status is.  I suspect this is not CNS and primary nature but because of warfarin use and stroke history I will get CT head.  After this she will need admission. Care to Dr. Deretha Emory. Final Clinical Impression(s) / ED Diagnoses Final diagnoses:  Hypoglycemia  Acute kidney injury (nontraumatic) (HCC)  Hypokalemia    Rx / DC Orders ED Discharge Orders    None       Pricilla Loveless, MD 09/20/2020 [phone]

## 2020-10-14 NOTE — ED Provider Notes (Signed)
Patient turned over to me by Pricilla Loveless. Patient's blood hypoglycemic situation was corrected but then she developed hypotensive situation Covid test was positive. But before that was back there was concerns about sepsis. Sepsis protocol was initiated. Lactic acid was elevated and then went from 3 range up into the 6.6 range. We are having difficulty getting fluids and her also trying to correct her potassium. So central line was placed. Dr. Henreitta Leber was then looking for business so she did the central line. Patient then received fluids. Her 3 cc/kg. The blood pressures came up to the low 90s. Patient's heart rate was always good. Patient's respiratory status and oxygen status was always very good and patient's mentation was good. Discussed with hospitalist they will admit. Have reordered CBC BMP. And she got the magnesium correction as well. Her hemoglobin was a little borderline at 7.9. Her stools are not grossly bloody but she had multiple episodes of diarrhea was fecal occult positive she is on Coumadin. Also her C. difficile testing was positive for antigen but negative for toxin so PCR was done and is pending.  Patient's blood sugars have remained actually elevated. Feel that this is probably COVID with more of a gastroenteritis type Covid. And the patient is very dehydrated. But she has been covered with broad-spectrum antibiotics in case there is true sepsis. She will need to continue her potassium replaced. She will need to have magnesium replaced as well. Hospitalist will see patient and admit. Also will need to follow her hemoglobins and hematocrits.    CRITICAL CARE Performed by: Vanetta Mulders Total critical care time: 60 minutes Critical care time was exclusive of separately billable procedures and treating other patients. Critical care was necessary to treat or prevent imminent or life-threatening deterioration. Critical care was time spent personally by me on the following activities:  development of treatment plan with patient and/or surrogate as well as nursing, discussions with consultants, evaluation of patient's response to treatment, examination of patient, obtaining history from patient or surrogate, ordering and performing treatments and interventions, ordering and review of laboratory studies, ordering and review of radiographic studies, pulse oximetry and re-evaluation of patient's condition.    Vanetta Mulders, MD 2020-10-28 2223

## 2020-10-14 NOTE — ED Notes (Signed)
Per Dr. Deretha Emory and x-ray central line is placed properly.

## 2020-10-14 NOTE — H&P (Incomplete)
History and Physical  Annette Rogers UXL:[id number] DOB: [birth year] DOA: October 27, 2020  Referring physician: ***  PCP: Oneal Grout, FNP  Outpatient Specialists: *** Patient coming from: *** & is able to ambulate ***  Chief Complaint: ***   HPI: Annette Rogers is a 65 y.o. female with medical history significant for *** (For level 3, the HPI must include 4+ descriptors: Location, Quality, Severity, Duration, Timing, Context, modifying factors, associated signs/symptoms and/or status of 3+ chronic problems.)   ED Course: ***  Review of Systems: Review of systems as noted in the HPI. All other systems reviewed and are negative.   Past Medical History:  Diagnosis Date  . COPD (chronic obstructive pulmonary disease) (HCC) 04/23/2020  . Gastrojejunostomy tube status (HCC) 05/31/2020  . GERD (gastroesophageal reflux disease) 07/15/2020  . High cholesterol   . Hypercalcemia 07/15/2020  . Hyperparathyroidism (HCC) 05/31/2020  . Hypertension   . Left hemiplegia (HCC) 04/29/2020  . Non-rheumatic mitral valve stenosis 12/09/2017  . Paroxysmal A-fib (HCC) 04/23/2020  . S/P mitral valve replacement with metallic valve 04/29/2020   Formatting of this note might be different from the original. [employer]. Jude regent mechanical  Procedures:   1. Mitral valve replacement (25 mm [employer]ent) (CPT [phone]) 2. Tricuspid valve repair (annuloplasty only)  (30  mm CE Physio ring) (CPT 33464) 4. 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 Procedures:   1. Mitral valve replacement (25 mm [employer] Jude [employer]ent) (CPT [phone]) 2. Tricuspid valve repair (annuloplasty only)  (30  mm CE Physio ring) (CPT 33464) 4. 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 Stroke [facility])    Past Surgical History:  Procedure Laterality Date  . ABDOMINAL HYSTERECTOMY    . CORONARY ARTERY BYPASS GRAFT    . GASTROSTOMY W/  FEEDING TUBE    . IR GJ TUBE CHANGE  07/22/2020  . JOINT REPLACEMENT      Social History:  reports that she has never smoked. She has never used smokeless tobacco. She reports that she does not drink alcohol and does not use drugs.   Allergies  Allergen Reactions  . Oxycodone Nausea Only    Family History  Problem Relation Age of Onset  . Hypertension Mother   . Stroke Father   . Hypertension Father   . Cancer - Colon Brother   . AAA (abdominal aortic aneurysm) Brother   . Heart disease Daughter     ***  Prior to Admission medications   Medication Sig Start Date End Date Taking? Authorizing Provider  acetaminophen (TYLENOL) 325 MG tablet Place 1 tablet (325 mg total) into feeding tube every 6 (six) hours as needed for mild pain. 09/23/20  Yes Johnson, Clanford L, MD  amiodarone (PACERONE) 200 MG tablet Place 1 tablet (200 mg total) into feeding tube daily. 09/23/20 09/23/21 Yes Johnson, Clanford L, MD  aspirin 81 MG EC tablet Take 81 mg by mouth daily. 06/21/20  Yes [provider]  atorvastatin (LIPITOR) 40 MG tablet Take 40 mg by mouth daily. 06/21/20 06/21/21 Yes [provider]  carvedilol (COREG) 3.125 MG tablet Take 3.125 mg by mouth 2 (two) times daily. 10/02/20  Yes [provider]  Cholecalciferol 50 MCG (2000 UT) TABS Take 1 tablet by mouth daily. 06/21/20 06/21/21 Yes [provider]  furosemide (LASIX) 20 MG tablet Take 20 mg by mouth 2 (two) times daily. 09/19/20  Yes [provider]  mirtazapine (REMERON) 30 MG tablet Take 30 mg by mouth at bedtime. 07/10/20  Yes [provider]  Multiple Vitamin (MULTIVITAMIN WITH MINERALS) TABS tablet Place 1 tablet into feeding tube daily. 07/25/20  Yes Johnson, Clanford L, MD  omeprazole (PRILOSEC) 20 MG capsule Take 20 mg by mouth daily. 09/19/20  Yes [provider]  potassium & sodium phosphates (PHOS-NAK) 280-160-250 MG PACK Place 1 packet into feeding tube 4 (four) times daily  -  with meals and at bedtime. 09/23/20  Yes Johnson, Clanford L, MD  senna-docusate (SENOKOT-S) 8.6-50 MG tablet Take 1 tablet by mouth 2 (two) times daily. 07/24/20  Yes Johnson, Clanford L, MD  sodium bicarbonate 650 MG tablet Take 1 tablet (650 mg total) by mouth 2 (two) times daily. 07/24/20  Yes Johnson, Clanford L, MD  cinacalcet (SENSIPAR) 90 MG tablet Take by mouth. Patient not taking: No sig reported 10/02/20   [provider]  diclofenac Sodium (VOLTAREN) 1 % GEL Apply topically. Patient not taking: Reported on 09/27/2020 06/21/20 06/21/21  [provider]  Nutritional Supplements (FEEDING SUPPLEMENT, OSMOLITE 1.5 CAL,) LIQD Place 1,000 mLs into feeding tube continuous. 07/24/20   Johnson, Clanford L, MD  ondansetron (ZOFRAN) 4 MG tablet Take by mouth. Patient not taking: No sig reported 10/07/20   [provider]  pantoprazole (PROTONIX) 40 MG tablet Take 40 mg by mouth daily. Patient not taking: Reported on 09/30/2020    [provider]  traZODone (DESYREL) 50 MG tablet Take 50 mg by mouth daily. Patient not taking: Reported on 10/07/2020 08/26/20   [provider]  warfarin (COUMADIN) 2 MG tablet Take 2 mg by mouth daily. Patient not taking: Reported on 10/12/2020 06/21/20 06/21/21  [provider]    Physical Exam: BP (!) 90/56   Pulse 94   Temp 98.1 F (36.7 C) (Rectal)   Resp (!) 21   Ht 5' (1.524 m)   Wt 67 kg   SpO2 100%   BMI 28.85 kg/m   . General: 65 y.o. year-old female well developed well nourished in no acute distress.  Alert and oriented x3. . Cardiovascular: Regular rate and rhythm with no rubs or gallops.  No thyromegaly or JVD noted.  No lower extremity edema. 2/4 pulses in all 4 extremities. Marland Kitchen Respiratory: Clear to auscultation with no wheezes or rales. Good inspiratory effort. . Abdomen: Soft nontender nondistended with normal bowel sounds x4 quadrants. . Muskuloskeletal: No cyanosis, clubbing or edema noted  bilaterally . Neuro: CN II-XII intact, strength, sensation, reflexes . Skin: No ulcerative lesions noted or rashes . Psychiatry: Judgement and insight appear normal. Mood is appropriate for condition and setting          Labs on Admission:  Basic Metabolic Panel: Recent Labs  Lab 09/16/2020 1221 09/21/2020 1256  NA  --  137  K  --  2.4*  CL  --  100  CO2  --  22  GLUCOSE  --  362*  BUN  --  21  CREATININE  --  1.56*  CALCIUM  --  7.0*  MG 1.2*  --    Liver Function Tests: No results for input(s): AST, ALT, ALKPHOS, BILITOT, PROT, ALBUMIN in the last 168 hours. No results for input(s): LIPASE, AMYLASE in the last 168 hours. No results for input(s): AMMONIA in the last 168 hours. CBC: Recent Labs  Lab 10/05/2020 1256  WBC 8.8  HGB 7.8*  HCT 23.8*  MCV 79.6*  PLT 258  Cardiac Enzymes: No results for input(s): CKTOTAL, CKMB, CKMBINDEX, TROPONINI in the last 168 hours.  BNP (last 3 results) No results for input(s): BNP in the last 8760 hours.  ProBNP (last 3 results) No results for input(s): PROBNP in the last 8760 hours.  CBG: Recent Labs  Lab 10/07/2020 1145 10/09/2020 1201 10/09/2020 1240 10/13/2020 1335 10/03/2020 1855  GLUCAP 105* 70 60* 405* 345*    Radiological Exams on Admission: CT Head Wo Contrast  Result Date: 09/26/2020 CLINICAL DATA:  Mental status change. EXAM: CT HEAD WITHOUT CONTRAST TECHNIQUE: Contiguous axial images were obtained from the base of the skull through the vertex without intravenous contrast. COMPARISON:  09/19/2020 FINDINGS: Brain: Encephalomalacia within the right frontal lobe is again noted compatible with remote infarct. Chronic right caudate nucleus lacunar infarct is unchanged. No acute infarct, hemorrhage, hydrocephalus, extra-axial collection or mass lesion/mass effect. Vascular: No hyperdense vessel or unexpected calcification. Skull: Normal. Negative for fracture or focal lesion. Sinuses/Orbits: Partial opacification of the right  sphenoid sinus is again noted in appears unchanged. The remaining paranasal sinuses and mastoid air cells are clear. Other: None. IMPRESSION: 1. No acute intracranial abnormalities. 2. 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 Electronically Signed   By: Signa Kell M.D.   On: 09/23/2020 16:02   DG Chest Port 1 View  Result Date: 09/15/2020 CLINICAL DATA:  65 year old female status post central line placement. EXAM: PORTABLE CHEST 1 VIEW COMPARISON:  Chest radiograph dated 10/11/2020 FINDINGS: Left subclavian central venous line with tip at the cavoatrial junction. No pneumothorax. No focal consolidation or pleural effusion. Stable cardiomediastinal silhouette. Median sternotomy wires and prostatic cardiac valve. No acute osseous pathology. IMPRESSION: Left subclavian central venous line with tip at the cavoatrial junction. No pneumothorax. Electronically Signed   By: Elgie Collard M.D.   On: 09/30/2020 20:14   DG Chest Port 1 View  Result Date: 09/28/2020 CLINICAL DATA:  Sepsis.  COVID positive. EXAM: PORTABLE CHEST 1 VIEW COMPARISON:  09/19/2020 FINDINGS: Post median sternotomy. Prosthetic valves. Previous internal jugular catheter is been removed. Heart is normal in size. Stable mediastinal contours. Minimal streaky opacities at the lung bases. No pulmonary edema. No pleural fluid, pneumothorax, or evidence of pneumomediastinum. No acute osseous abnormalities are seen. IMPRESSION: Minimal streaky bibasilar opacities, favor atelectasis. Electronically Signed   By: Narda Rutherford M.D.   On: 09/29/2020 18:39    EKG: I independently viewed the EKG done and my findings are as followed: ***   Assessment/Plan Present on Admission: . COVID-19 virus infection  Active Problems:   Acute kidney injury (nontraumatic) (HCC)   COVID-19 virus infection   DVT prophylaxis: ***   Code Status: ***   Family Communication: ***   Disposition Plan: ***   Consults  called: ***   Admission status: ***     Frankey Shown MD [facility] Pager [phone]  If 7PM-7AM, please contact night-coverage [email or web address] Password [facility]  10/08/2020, 10:21 PM

## 2020-10-14 NOTE — ED Triage Notes (Signed)
Pt brought in by EMS for Blood sugar 20. EMS unable to get IV site. Upon arrival CBG 12. DR goldston notified. IV via US obtained and D50 administerd. Glucagon adm Recheck CBG 105.

## 2020-10-14 NOTE — Progress Notes (Signed)
Notified bedside nurse of need to draw lactic acid and repeat lactic acid. 

## 2020-10-14 NOTE — ED Notes (Signed)
Date and time results received: Oct 16, 2020 2300 (use smartphrase ".now" to insert current time)  Test: hemoglobin Critical Value: 6.6  Name of Provider Notified: Dr Thomes Dinning  Orders Received? Or Actions Taken?: no orders received

## 2020-10-14 NOTE — ED Notes (Signed)
CRITICAL VALUE ALERT  Critical Value:  Lactic 3.8 K+ 2.4  Date & Time Notied:  2020-11-11 0127  Provider Notified: Dr. Criss Alvine  Orders Received/Actions taken: see chart

## 2020-10-14 NOTE — Progress Notes (Addendum)
Pharmacy Antibiotic Note  Annette Rogers is a 65 y.o. female admitted on 09/29/2020 with sepsis from unknown source.  Pharmacy has been consulted for Cefepime and Vancomycin dosing.  Ceftriaxone 1gm IV x 1 already given.   Creatinine up from baseline ~1.0.    C diff antigen positive but toxin negative.  COVID-19 positive  Cefepime 2gm IV, Metronidazole 500 mg IV and Vancomycin 1gm IV all ordered x 1 in ED.  Plan: Cefepime 2 gm IV q12h. Change Vancomycin from 1 gm to 1500 mg IV loading dose x 1. Vancomycin 1250 mg IV Q 48 hrs to begin on 10/16/20 Goal AUC 400-550. Expected AUC: 507 SCr used: 1.56 Will follow renal function and adjust antibiotics regimens as indicated. Follow culture data, clinical status. Vancomycin peak and trough levels as indicated.   Height: 5' (152.4 cm) Weight: 67 kg (147 lb 11.3 oz) IBW/kg (Calculated) : 45.5  Temp (24hrs), Avg:97.8 F (36.6 C), Min:97.8 F (36.6 C), Max:97.8 F (36.6 C)  Recent Labs  Lab 09/17/2020 1255 09/19/2020 1256 09/15/2020 1456  WBC  --  8.8  --   CREATININE  --  1.56*  --   LATICACIDVEN 3.8*  --  6.6*    Estimated Creatinine Clearance: 30.3 mL/min (A) (by C-G formula based on SCr of 1.56 mg/dL (H)).    Allergies  Allergen Reactions  . Oxycodone Nausea Only    Antimicrobials this admission: Ceftriaxone 1 gm IV given at 1651 on 1/31 Cefepime 1/31>> Vancomycin 1/31>> Metronidazole x 1 on 1/31  Dose adjustments this admission:  n/a  Microbiology results: 1/31 C diff: antigen positive but toxin negative 1/31 COVID: postive 1/31 blood x 2: sent  Thank you for allowing pharmacy to be a part of this patient's care.  Dennie Fetters, [place] 10/06/2020 6:23 PM

## 2020-10-15 ENCOUNTER — Encounter (HOSPITAL_COMMUNITY): Payer: Self-pay | Admitting: Internal Medicine

## 2020-10-15 ENCOUNTER — Inpatient Hospital Stay (HOSPITAL_COMMUNITY): Payer: Medicare Other

## 2020-10-15 DIAGNOSIS — E872 Acidosis, unspecified: Secondary | ICD-10-CM

## 2020-10-15 DIAGNOSIS — G9341 Metabolic encephalopathy: Secondary | ICD-10-CM

## 2020-10-15 DIAGNOSIS — I469 Cardiac arrest, cause unspecified: Secondary | ICD-10-CM | POA: Diagnosis not present

## 2020-10-15 DIAGNOSIS — R112 Nausea with vomiting, unspecified: Secondary | ICD-10-CM | POA: Diagnosis not present

## 2020-10-15 DIAGNOSIS — D649 Anemia, unspecified: Secondary | ICD-10-CM

## 2020-10-15 DIAGNOSIS — E162 Hypoglycemia, unspecified: Secondary | ICD-10-CM | POA: Diagnosis not present

## 2020-10-15 DIAGNOSIS — K922 Gastrointestinal hemorrhage, unspecified: Secondary | ICD-10-CM

## 2020-10-15 DIAGNOSIS — R1030 Lower abdominal pain, unspecified: Secondary | ICD-10-CM | POA: Diagnosis not present

## 2020-10-15 DIAGNOSIS — A0472 Enterocolitis due to Clostridium difficile, not specified as recurrent: Secondary | ICD-10-CM

## 2020-10-15 DIAGNOSIS — R9431 Abnormal electrocardiogram [ECG] [EKG]: Secondary | ICD-10-CM

## 2020-10-15 DIAGNOSIS — R109 Unspecified abdominal pain: Secondary | ICD-10-CM

## 2020-10-15 LAB — COMPREHENSIVE METABOLIC PANEL
ALT: 81 U/L — ABNORMAL HIGH (ref 0–44)
AST: 212 U/L — ABNORMAL HIGH (ref 15–41)
Albumin: 1.6 g/dL — ABNORMAL LOW (ref 3.5–5.0)
Alkaline Phosphatase: 125 U/L (ref 38–126)
Anion gap: 9 (ref 5–15)
BUN: 17 mg/dL (ref 8–23)
CO2: 17 mmol/L — ABNORMAL LOW (ref 22–32)
Calcium: 6.5 mg/dL — ABNORMAL LOW (ref 8.9–10.3)
Chloride: 112 mmol/L — ABNORMAL HIGH (ref 98–111)
Creatinine, Ser: 1.21 mg/dL — ABNORMAL HIGH (ref 0.44–1.00)
GFR, Estimated: 49 mL/min — ABNORMAL LOW (ref >60)
Glucose, Bld: 172 mg/dL — ABNORMAL HIGH (ref 70–99)
Potassium: 2 mmol/L — CL (ref 3.5–5.1)
Sodium: 138 mmol/L (ref 135–145)
Total Bilirubin: 1.6 mg/dL — ABNORMAL HIGH (ref 0.3–1.2)
Total Protein: 5.2 g/dL — ABNORMAL LOW (ref 6.5–8.1)

## 2020-10-15 LAB — CBC
HCT: 32.2 % — ABNORMAL LOW (ref 36.0–46.0)
Hemoglobin: 10.7 g/dL — ABNORMAL LOW (ref 12.0–15.0)
MCH: 27.4 pg (ref 26.0–34.0)
MCHC: 33.2 g/dL (ref 30.0–36.0)
MCV: 82.4 fL (ref 80.0–100.0)
Platelets: 195 10*3/uL (ref 150–400)
RBC: 3.91 MIL/uL (ref 3.87–5.11)
RDW: 18.2 % — ABNORMAL HIGH (ref 11.5–15.5)
WBC: 10.2 10*3/uL (ref 4.0–10.5)
nRBC: 0.2 % (ref 0.0–0.2)

## 2020-10-15 LAB — GLUCOSE, CAPILLARY
Glucose-Capillary: 12 mg/dL — CL (ref 70–99)
Glucose-Capillary: 132 mg/dL — ABNORMAL HIGH (ref 70–99)
Glucose-Capillary: 139 mg/dL — ABNORMAL HIGH (ref 70–99)
Glucose-Capillary: 158 mg/dL — ABNORMAL HIGH (ref 70–99)
Glucose-Capillary: 152 mg/dL — ABNORMAL HIGH (ref 70–99)

## 2020-10-15 LAB — HEPATITIS PANEL, ACUTE
HCV Ab: NONREACTIVE
Hep A IgM: NONREACTIVE
Hep B C IgM: NONREACTIVE
Hepatitis B Surface Ag: NONREACTIVE

## 2020-10-15 LAB — MAGNESIUM: Magnesium: 1.5 mg/dL — ABNORMAL LOW (ref 1.7–2.4)

## 2020-10-15 LAB — IRON AND TIBC
Iron: 90 ug/dL (ref 28–170)
Saturation Ratios: 89 % — ABNORMAL HIGH (ref 10.4–31.8)
TIBC: 101 ug/dL — ABNORMAL LOW (ref 250–450)
UIBC: 11 ug/dL

## 2020-10-15 LAB — PROTIME-INR
INR: 2.3 — ABNORMAL HIGH (ref 0.8–1.2)
Prothrombin Time: 24.8 seconds — ABNORMAL HIGH (ref 11.4–15.2)

## 2020-10-15 LAB — FERRITIN: Ferritin: 5926 ng/mL — ABNORMAL HIGH (ref 11–307)

## 2020-10-15 LAB — LACTIC ACID, PLASMA
Lactic Acid, Venous: 3.4 mmol/L (ref 0.5–1.9)
Lactic Acid, Venous: 4.7 mmol/L (ref 0.5–1.9)

## 2020-10-15 LAB — MRSA PCR SCREENING: MRSA by PCR: NEGATIVE

## 2020-10-15 LAB — APTT: aPTT: 48 seconds — ABNORMAL HIGH (ref 24–36)

## 2020-10-15 LAB — TROPONIN I (HIGH SENSITIVITY): Troponin I (High Sensitivity): 4360 ng/L (ref <18)

## 2020-10-15 LAB — PHOSPHORUS: Phosphorus: 1.8 mg/dL — ABNORMAL LOW (ref 2.5–4.6)

## 2020-10-15 MED ORDER — NOREPINEPHRINE 4 MG/250ML-% IV SOLN
2.0000 ug/min | INTRAVENOUS | Status: DC
  Administered 2020-10-15 (×2): 15 ug/min via INTRAVENOUS
  Administered 2020-10-15: 2 ug/min via INTRAVENOUS
  Filled 2020-10-15 (×3): qty 250

## 2020-10-15 MED ORDER — PANTOPRAZOLE SODIUM 40 MG IV SOLR
40.0000 mg | Freq: Two times a day (BID) | INTRAVENOUS | Status: DC
  Administered 2020-10-15: 40 mg via INTRAVENOUS
  Filled 2020-10-15: qty 40

## 2020-10-15 MED ORDER — MAGNESIUM SULFATE 2 GM/50ML IV SOLN
2.0000 g | Freq: Once | INTRAVENOUS | Status: AC
  Administered 2020-10-15: 2 g via INTRAVENOUS
  Filled 2020-10-15: qty 50

## 2020-10-15 MED ORDER — VASOPRESSIN 20 UNITS/100 ML INFUSION FOR SHOCK
0.0000 [IU]/min | INTRAVENOUS | Status: DC
  Administered 2020-10-15 (×2): 0.03 [IU]/min via INTRAVENOUS
  Filled 2020-10-15 (×3): qty 100

## 2020-10-15 MED ORDER — CHLORHEXIDINE GLUCONATE CLOTH 2 % EX PADS
6.0000 | MEDICATED_PAD | Freq: Every day | CUTANEOUS | Status: DC
  Administered 2020-10-15: 6 via TOPICAL

## 2020-10-15 MED ORDER — PROCHLORPERAZINE EDISYLATE 10 MG/2ML IJ SOLN: 10.0000 mg | Freq: Four times a day (QID) | INTRAMUSCULAR | Status: DC | PRN

## 2020-10-15 MED ORDER — PANTOPRAZOLE SODIUM 40 MG IV SOLR
INTRAVENOUS | Status: AC
  Filled 2020-10-15: qty 80

## 2020-10-15 MED ORDER — SODIUM CHLORIDE 0.9 % IV SOLN: 2.0000 g | Freq: Two times a day (BID) | INTRAVENOUS | Status: DC

## 2020-10-15 MED ORDER — POTASSIUM CHLORIDE 10 MEQ/100ML IV SOLN
10.0000 meq | INTRAVENOUS | Status: AC
  Administered 2020-10-15 (×3): 10 meq via INTRAVENOUS
  Filled 2020-10-15 (×5): qty 100

## 2020-10-15 MED ORDER — SODIUM CHLORIDE 0.9 % IV SOLN: 250.0000 mL | INTRAVENOUS | Status: DC

## 2020-10-15 MED ORDER — PANTOPRAZOLE SODIUM 40 MG IV SOLR
80.0000 mg | INTRAVENOUS | Status: AC
  Administered 2020-10-15: 80 mg via INTRAVENOUS
  Filled 2020-10-15: qty 80

## 2020-10-15 MED ORDER — SODIUM CHLORIDE 0.9 % IV SOLN: INTRAVENOUS | Status: DC | PRN

## 2020-10-15 MED ORDER — ZINC SULFATE 220 (50 ZN) MG PO CAPS
220.0000 mg | ORAL_CAPSULE | Freq: Every day | ORAL | Status: DC
  Administered 2020-10-15: 220 mg via ORAL
  Filled 2020-10-15: qty 1

## 2020-10-15 MED ORDER — SODIUM CHLORIDE 0.9 % IV SOLN: INTRAVENOUS | Status: DC

## 2020-10-15 MED ORDER — ASCORBIC ACID 500 MG PO TABS
500.0000 mg | ORAL_TABLET | Freq: Every day | ORAL | Status: DC
  Administered 2020-10-15: 500 mg via ORAL
  Filled 2020-10-15: qty 1

## 2020-10-15 MED ORDER — AMIODARONE HCL 200 MG PO TABS
200.0000 mg | ORAL_TABLET | Freq: Every day | ORAL | Status: DC
  Administered 2020-10-15: 200 mg
  Filled 2020-10-15: qty 1

## 2020-10-15 MED ORDER — PANTOPRAZOLE SODIUM 40 MG IV SOLR: 40.0000 mg | INTRAVENOUS | Status: DC

## 2020-10-15 DEATH — deceased

## 2020-10-16 LAB — BPAM RBC
Blood Product Expiration Date: 202203012359
Blood Product Expiration Date: 202203062359
ISSUE DATE / TIME: 202202010101
ISSUE DATE / TIME: 202202010423
Unit Type and Rh: 5100
Unit Type and Rh: 5100

## 2020-10-16 LAB — TYPE AND SCREEN
ABO/RH(D): O POS
Antibody Screen: NEGATIVE
Unit division: 0
Unit division: 0

## 2020-10-19 LAB — CULTURE, BLOOD (SINGLE)
Culture: NO GROWTH
Special Requests: ADEQUATE

## 2020-10-21 MED FILL — Medication: Qty: 1 | Status: AC

## 2020-11-12 NOTE — Progress Notes (Signed)
SLP Cancellation Note  Patient Details Name: Annette Rogers MRN: [id number] DOB: [birth year]   Cancelled treatment:       Reason Eval/Treat Not Completed: Medical issues which prohibited therapy (Pt went into cardiac arrest and now intubated. SLP will sign off until re-consulted.)  Thank you,  Havery Moros, CCC-SLP [phone]  PORTER,DABNEY 10/29/2020, 3:21 PM

## 2020-11-12 NOTE — Progress Notes (Signed)
Pt BP extremely low after first 15 minutes of blood transfusion(57/33). Stopped blood and contacted Dr. Thomes Dinning started her on Levophed and said to continue blood product but to watch closely. Restarted blood and started levophed.

## 2020-11-12 NOTE — ED Provider Notes (Signed)
Called to patient's room in ICU as a Code Blue. Hospitalists at bedside on my arrival running ACLS algorithm, ambu bag. I was able to intubate her without difficulty. Assisted with ACLS until ROSC. Confirmed with Hospitalist that they would continue her care.   Procedure Name: Intubation Date/Time: October 31, 2020 2:18 PM Performed by: Pollyann Savoy, MD Pre-anesthesia Checklist: Patient identified Oxygen Delivery Method: Ambu bag Preoxygenation: Pre-oxygenation with 100% oxygen Ventilation: Mask ventilation without difficulty Laryngoscope Size: Glidescope and 3 Grade View: Grade I Tube size: 7.0 mm Number of attempts: 1 Placement Confirmation: ETT inserted through vocal cords under direct vision,  CO2 detector and Breath sounds checked- equal and bilateral Secured at: 24 cm Tube secured with: ETT holder         Pollyann Savoy, MD 10/31/2020 1419

## 2020-11-12 NOTE — H&P (Signed)
History and Physical  Saprina Chuong ZOX:[id number] DOB: 12-04-1955 DOA: 11-01-2020  Referring physician:, Vanetta Mulders, MD PCP: Oneal Grout, FNP  Patient coming from: Home  Chief Complaint: Low blood glucose, altered mental status   HPI: Annette Rogers is a 65 y.o. female with medical history significant for prior stroke with chronic gastrostomy tube for nutrition and medications, GERD, hyperlipidemia, hyperparathyroidism, hypertension, chronic left-sided hemiplegia from CVA, nonrheumatic mitral valve stenosis status post metallic valve replacement, chronic anticoagulation with warfarin, history of tricuspid valve repair to the emergency department via EMS due to altered mental status and low blood glucose.  Patient was unable to provide history, history was obtained from ED physician and ED medical record.  Per report, patient presents to the ED with low blood glucose (28 > 20 >12) which was corrected on arrival.  Apparently, patient was reported to have been sick with diarrhea for 2 weeks, this was also reported to be associated with vomiting per ED physician.  she was noted to be hypotensive She was admitted here from 1/6-1/10 due to acute renal failure, hypotension, hypokalemia, hypomagnesemia.  ED Course: BP was soft (73/48), central line was placed, IV hydration per sepsis protocol was provided.  Work-up in the ED showed microcytic anemia, hypokalemia, BUN to creatinine 21/1.56 (baseline creatinine at 0.9-1.0), hypoglycemia, lactic acid 3.8 > 6.6> 6.6> 5.8, magnesium 1.2, FOBT positive, SARS coronavirus 2 was positive, C. difficile toxin was positive. CT of head without contrast showed no acute intracranial abnormalities Chest x-ray showed minimally streaky bibasilar opacities with suspicion for atelectasis. Hypoglycemia was corrected as described above in HPI.  IV hydration per sepsis protocol was provided, she was empirically treated with IV cefepime, ceftriaxone and Flagyl.  Potassium  was replenished.  Hospitalist was asked to admit patient for further evaluation and management.  Review of Systems: This cannot be obtained at this time due to patient's altered mental status  Past Medical History:  Diagnosis Date  . COPD (chronic obstructive pulmonary disease) (HCC) 04/23/2020  . Gastrojejunostomy tube status (HCC) 05/31/2020  . GERD (gastroesophageal reflux disease) 07/15/2020  . High cholesterol   . Hypercalcemia 07/15/2020  . Hyperparathyroidism (HCC) 05/31/2020  . Hypertension   . Left hemiplegia (HCC) 04/29/2020  . Non-rheumatic mitral valve stenosis 12/09/2017  . Paroxysmal A-fib (HCC) 04/23/2020  . S/P mitral valve replacement with metallic valve 04/29/2020   Formatting of this note might be different from the original. [employer]ent mechanical  Procedures:   1. Mitral valve replacement (25 mm [employer]ent) (CPT [phone]) 2. Tricuspid valve repair (annuloplasty only)  (30  mm CE Physio ring) (CPT 33464) 4. 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 Procedures:   1. Mitral valve replacement (25 mm [employer]) (CPT [phone]) 2. Tricuspid valve repair (annuloplasty only)  (30  mm CE Physio ring) (CPT 33464) 4. 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 Stroke [facility])    Past Surgical History:  Procedure Laterality Date  . ABDOMINAL HYSTERECTOMY    . CORONARY ARTERY BYPASS GRAFT    . GASTROSTOMY W/ FEEDING TUBE    . IR GJ TUBE CHANGE  07/22/2020  . JOINT REPLACEMENT      Social History:  reports that she has never smoked. She has never used smokeless tobacco. She reports that she does not drink alcohol and does not use drugs.   Allergies  Allergen Reactions  .  Oxycodone Nausea Only    Family History  Problem Relation Age of Onset  . Hypertension Mother   . Stroke Father   . Hypertension Father   . Cancer - Colon Brother   . AAA  (abdominal aortic aneurysm) Brother   . Heart disease Daughter      Prior to Admission medications   Medication Sig Start Date End Date Taking? Authorizing Provider  acetaminophen (TYLENOL) 325 MG tablet Place 1 tablet (325 mg total) into feeding tube every 6 (six) hours as needed for mild pain. 09/23/20  Yes Johnson, Clanford L, MD  amiodarone (PACERONE) 200 MG tablet Place 1 tablet (200 mg total) into feeding tube daily. 09/23/20 09/23/21 Yes Johnson, Clanford L, MD  aspirin 81 MG EC tablet Take 81 mg by mouth daily. 06/21/20  Yes [provider]  atorvastatin (LIPITOR) 40 MG tablet Take 40 mg by mouth daily. 06/21/20 06/21/21 Yes [provider]  carvedilol (COREG) 3.125 MG tablet Take 3.125 mg by mouth 2 (two) times daily. 10/02/20  Yes [provider]  Cholecalciferol 50 MCG (2000 UT) TABS Take 1 tablet by mouth daily. 06/21/20 06/21/21 Yes [provider]  furosemide (LASIX) 20 MG tablet Take 20 mg by mouth 2 (two) times daily. 09/19/20  Yes [provider]  mirtazapine (REMERON) 30 MG tablet Take 30 mg by mouth at bedtime. 07/10/20  Yes [provider]  Multiple Vitamin (MULTIVITAMIN WITH MINERALS) TABS tablet Place 1 tablet into feeding tube daily. 07/25/20  Yes Johnson, Clanford L, MD  omeprazole (PRILOSEC) 20 MG capsule Take 20 mg by mouth daily. 09/19/20  Yes [provider]  potassium & sodium phosphates (PHOS-NAK) 280-160-250 MG PACK Place 1 packet into feeding tube 4 (four) times daily -  with meals and at bedtime. 09/23/20  Yes Johnson, Clanford L, MD  senna-docusate (SENOKOT-S) 8.6-50 MG tablet Take 1 tablet by mouth 2 (two) times daily. 07/24/20  Yes Johnson, Clanford L, MD  sodium bicarbonate 650 MG tablet Take 1 tablet (650 mg total) by mouth 2 (two) times daily. 07/24/20  Yes Johnson, Clanford L, MD  cinacalcet (SENSIPAR) 90 MG tablet Take by mouth. Patient not taking: No sig reported 10/02/20   [provider]   diclofenac Sodium (VOLTAREN) 1 % GEL Apply topically. Patient not taking: Reported on 09/20/2020 06/21/20 06/21/21  [provider]  Nutritional Supplements (FEEDING SUPPLEMENT, OSMOLITE 1.5 CAL,) LIQD Place 1,000 mLs into feeding tube continuous. 07/24/20   Johnson, Clanford L, MD  ondansetron (ZOFRAN) 4 MG tablet Take by mouth. Patient not taking: No sig reported 10/07/20   [provider]  pantoprazole (PROTONIX) 40 MG tablet Take 40 mg by mouth daily. Patient not taking: Reported on 10/13/2020    [provider]  traZODone (DESYREL) 50 MG tablet Take 50 mg by mouth daily. Patient not taking: Reported on 09/23/2020 08/26/20   [provider]  warfarin (COUMADIN) 2 MG tablet Take 2 mg by mouth daily. Patient not taking: Reported on 09/19/2020 06/21/20 06/21/21  [provider]    Physical Exam: BP (!) 72/45   Pulse 81   Temp 98.3 F (36.8 C) (Oral)   Resp 17   Ht 5' (1.524 m)   Wt 64.1 kg   SpO2 100%   BMI 27.60 kg/m   . General: 65 y.o. year-old female ill appearing, but in no acute distress. Alert and oriented x1. Marland Kitchen HEENT:.  Dry mucous membrane.  NCAT, EOMI . Neck: Supple, trachea medial . Cardiovascular: Regular rate  and rhythm with no rubs or gallops.  No thyromegaly or JVD noted. 2/4 pulses in all 4 extremities. Marland Kitchen Respiratory: Clear to auscultation with no wheezes or rales. Good inspiratory effort. . Abdomen: Soft, nontender nondistended with normal bowel sounds x4 quadrants. . Muskuloskeletal: No cyanosis, clubbing or edema noted bilaterally . Neuro: Left-sided hemiparesis, limited neurological exam at this time due to patient's current status . Skin: No ulcerative lesions noted or rashes  . Psychiatry: This cannot be evaluated at this time due to patient's current condition.         Labs on Admission:  Basic Metabolic Panel: Recent Labs  Lab 10/11/2020 1221 09/30/2020 1256 09/21/2020 2242  NA  --  137 137  K  --  2.4* 2.4*  CL  --   100 109  CO2  --  22 16*  GLUCOSE  --  362* 210*  BUN  --  21 18  CREATININE  --  1.56* 1.47*  CALCIUM  --  7.0* 6.3*  MG 1.2*  --   --    Liver Function Tests: No results for input(s): AST, ALT, ALKPHOS, BILITOT, PROT, ALBUMIN in the last 168 hours. No results for input(s): LIPASE, AMYLASE in the last 168 hours. No results for input(s): AMMONIA in the last 168 hours. CBC: Recent Labs  Lab 10/10/2020 1256 10/03/2020 2242  WBC 8.8 11.0*  HGB 7.8* 6.6*  HCT 23.8* 19.3*  MCV 79.6* 77.5*  PLT 258 233   Cardiac Enzymes: No results for input(s): CKTOTAL, CKMB, CKMBINDEX, TROPONINI in the last 168 hours.  BNP (last 3 results) No results for input(s): BNP in the last 8760 hours.  ProBNP (last 3 results) No results for input(s): PROBNP in the last 8760 hours.  CBG: Recent Labs  Lab 09/30/2020 1145 10/08/2020 1201 10/09/2020 1240 09/23/2020 1335 10/05/2020 1855  GLUCAP 105* 70 60* 405* 345*    Radiological Exams on Admission: CT Head Wo Contrast  Result Date: 09/24/2020 CLINICAL DATA:  Mental status change. EXAM: CT HEAD WITHOUT CONTRAST TECHNIQUE: Contiguous axial images were obtained from the base of the skull through the vertex without intravenous contrast. COMPARISON:  09/19/2020 FINDINGS: Brain: Encephalomalacia within the right frontal lobe is again noted compatible with remote infarct. Chronic right caudate nucleus lacunar infarct is unchanged. No acute infarct, hemorrhage, hydrocephalus, extra-axial collection or mass lesion/mass effect. Vascular: No hyperdense vessel or unexpected calcification. Skull: Normal. Negative for fracture or focal lesion. Sinuses/Orbits: Partial opacification of the right sphenoid sinus is again noted in appears unchanged. The remaining paranasal sinuses and mastoid air cells are clear. Other: None. IMPRESSION: 1. No acute intracranial abnormalities. 2. 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 Electronically Signed   By:  Signa Kell M.D.   On: 10/13/2020 16:02   DG Chest Port 1 View  Result Date: 10/02/2020 CLINICAL DATA:  64 year old female status post central line placement. EXAM: PORTABLE CHEST 1 VIEW COMPARISON:  Chest radiograph dated 10/02/2020 FINDINGS: Left subclavian central venous line with tip at the cavoatrial junction. No pneumothorax. No focal consolidation or pleural effusion. Stable cardiomediastinal silhouette. Median sternotomy wires and prostatic cardiac valve. No acute osseous pathology. IMPRESSION: Left subclavian central venous line with tip at the cavoatrial junction. No pneumothorax. Electronically Signed   By: Elgie Collard M.D.   On: 09/25/2020 20:14   DG Chest Port 1 View  Result Date: 09/21/2020 CLINICAL DATA:  Sepsis.  COVID positive. EXAM: PORTABLE CHEST 1 VIEW COMPARISON:  09/19/2020 FINDINGS: Post median sternotomy.  Prosthetic valves. Previous internal jugular catheter is been removed. Heart is normal in size. Stable mediastinal contours. Minimal streaky opacities at the lung bases. No pulmonary edema. No pleural fluid, pneumothorax, or evidence of pneumomediastinum. No acute osseous abnormalities are seen. IMPRESSION: Minimal streaky bibasilar opacities, favor atelectasis. Electronically Signed   By: Narda Rutherford M.D.   On: 11/07/2020 18:39    EKG: I independently viewed the EKG done and my findings are as followed: Normal sinus rhythm at a rate of 94 bpm with prolonged QTc (523 ms)   Assessment/Plan Present on Admission: . COVID-19 virus infection . Hypotension . Dehydration . Hypoglycemia . Hypokalemia . Hypomagnesemia  Active Problems:   Hypokalemia   Hypomagnesemia   Hypotension   Dehydration   AKI (acute kidney injury) (HCC)   Hypoglycemia   COVID-19 virus infection   C. difficile diarrhea   Lactic acidosis   GI bleed   Acute metabolic encephalopathy   Prolonged QT interval  Acute metabolic encephalopathy possibly secondary to  hypoglycemia/dehydration-improving Hypoglycemia resolved, continue Accu-Cheks IV hydration provided, continue IV hydration Patient was treated on IV cefepime and vancomycin due to suspicion for sepsis, we shall continue with these at this time with plan to de-escalate/discontinue based on blood culture  Lactic acidosis secondary to multifactorial Lactic acid 3.8 > 6.6> 6.6> 5.8 Continue IV hydration and continue to trend lactic acid  Hypotension possibly due to dehydration/Infectious She had similar presentation during last admission IV hydration provided, patient was unresponsive to fluids and continued to have low BP Patient was started on IV pressors (Levophed and vasopressin) to maintain MAP of >/= 65 Continue IV hydration  COVID-19 gastroenteritis SARS coronavirus 2 was positive Patient has no shortness of breath or any respiratory symptoms Chest x-ray was suggestive of atelectasis without infiltrates typical of COVID-19 virus pneumonia No indication for breathing treatment, remdesivir or Decadron at this time Continue vitamin-C 500 mg p.o. Daily Continue zinc 220 mg p.o. Daily Continue Tylenol p.r.n. for fever Continue monitoring daily inflammatory markers Physician PPE:  Surgical mask with face shield, N-95, nonsterile gloves, disposable gown, head and shoe cover s Patient PPE:  Face mask   C. difficile diarrhea C. difficile toxin was positive, patient was started on oral vancomycin  GI bleed  H/H= 6.6, this was 8.1 on 1/10 Hemoccult was positive Hemoglobin dropped from 7.8 to 6.6 since arrival to the ED (this may be due to hemodilution) Type and crossmatch was done and 2 units of PRBC will be transfused  IV Protonix 80 mg will be given and patient will be continue with IV Protonix 40mg  daily Gastroenterologist will be consulted in the morning  Prolonged QTc possibly due to hypokalemia and Hypomagnesemia K+ 2.4; replenished Mg 1.2; replenished Avoid QT prolonging  drugs  Acute kidney injury possibly due to dehydration BUN to creatinine 21/1.56 (baseline creatinine at 0.9-1.0) Continue IV hydration Renally adjust medications, avoid nephrotoxic agents/dehydration/hypotension  Paroxysmal atrial fibrillation EKG currently showed normal sinus rhythm at rate of 94 bpm Warfarin will be held at this time due to GI bleed Beta-blocker will be held at this time due to soft BP Continue amiodarone per home regimen  Hyperparathyroidism Continue to monitor electrolytes closely  COPD-stable  GERD Continue Protonix   DVT prophylaxis: SCDs  Code Status: Full code  Family Communication: None at bedside  Disposition Plan:  Patient is from:                        home Anticipated  DC to:                   SNF or family members home Anticipated DC date:               2-3 days Anticipated DC barriers:           Patient is unstable to be discharged at this time due to hypotension, electrolyte abnormalities, C. difficile which require patient management   Consults called: None  Admission status: Inpatient   Frankey Shownladapo Maebelle Sulton MD [facility]  10/17/2020, 2:24 AM

## 2020-11-12 NOTE — Progress Notes (Signed)
Instructed patient in IS usage and in Flutter.  Patient seems to be very weak but still did both X5.  Patient was able to reach on IS.  Patient seemed to be able to perform the flutter better than the IS.  Both are at bedside and informed patient to try to do them X10 if possible every hour she was awake.

## 2020-11-12 NOTE — Progress Notes (Signed)
Spiritual Care Support Requested for TOD Received call from hospital Chaplain Joya Gaskins that patient had multiple codes today and that she was passing. She requested support for family and staff of unit.  Chaplain arrived and was met by [facility] and introduced to family. Patient had passed and physician, patient advocate, chaplain, and RN was present with them today. Chaplain provided spiritual presence, support to family and staff, and provided family opportunity to have prayer and closure bedside. Engaged them in life review and reflection around her last year and life and spiritual heritage. Family that are present are coping well at this time. Chaplain will remain available as needed or requested in order to provide spiritual support and to assess for spiritual need.

## 2020-11-12 NOTE — Consult Note (Signed)
_0 @   Referring Provider: [facility] Primary Care Physician:  Ludwig Clarks, FNP Primary Gastroenterologist:  Dr. Jenetta Downer   Date of Admission: 10/02/2020 Date of Consultation: October 23, 2020  Reason for Consultation: Anemia with heme positive stool.  HPI:  Annette Rogers is a 65 y.o. year old female with history of stroke in August 2021 with chronic left-sided hemiplegia and chronic gastrojejunostomy tube, GERD, hyperlipidemia, hyperparathyroidism, hypertension, atrial fibrillation, nonrheumatic mitral valve stenosis status post metallic valve replacement, severe TR s/p, chronic anticoagulation with warfarin.  Patient presented to the emergency room via EMS on 1/31 due to altered mental status with low blood glucose (28 > 20 >12) which was corrected on arrival, and hyportension.  Patient reported she had been sick for 2 weeks with diarrhea and vomiting.    ED Course: BP was soft (73/48), central line was placed, IV hydration per sepsis protocol was provided.  Work-up in the ED showed microcytic anemia with hemoglobin 7.8, INR 2.0 on warfarin, hypokalemia, BUN to creatinine 21/1.56 (baseline creatinine at 0.9-1.0), hypoglycemia, lactic acid 3.8 > 6.6> 6.6> 5.8, magnesium 1.2, urine culture with moderate leukocytes and many bacteria, FOBT positive, SARS coronavirus 2 was positive, C. difficile toxin was positive. CT of head without contrast showed no acute intracranial abnormalities Chest x-ray showed minimally streaky bibasilar opacities with suspicion for atelectasis. Hypoglycemia was corrected, IV hydration per sepsis protocol was provided, she was empirically treated with IV cefepime, ceftriaxone and Flagyl. Also started on oral vancomycin for C. difficile.  Potassium was replenished.  Hospitalist asked to admit and GI consulted due to anemia with heme positive stool.  She remained hypotensive and was started on Levophed and vasopressin. Warfarin placed on hold. Hemoglobin declined  from 7.8-6.6. 2 units PRBCs was ordered and transfused early morning of 2/1. She was started on IV Protonix 80 mg x 1 followed by 40 mg BID.  Today:  Patient is a poor historian. States she has had diarrhea and nausea with vomiting for a couple of days or a couple of weeks. Vomited something brown at some point. Can't say when. No melena or brbpr. Mild abdominal cramping in the lower abdomen. Started when diarrhea started.   States she doesn't take anything by mouth and uses her tube for eating and drinking. Later asks for water and states she can drink liquids.   Denies history of GERD. First states she takes ibuprofen at home but later states she doesn't. When asked again she can't tell me how often she takes ibuprofen. No prior EGD or colonoscopy.  Spoke with nursing staff. No BM since moving up to ICU last night. Nurse states she received report that she had several BMs in the ED. No overt GI bleeding per nurse. No nausea or vomiting. Her main complaint is abdominal discomfort to nursing staff.  She has not received any oral medications as nursing staff is not sure if patient should have p.o. medication or per tube. She received report from night nurse that patient has coughing episode after trying to take po medication.   Spoke with Daughter [facility] Annette Rogers): Had been having some abdominal cramping. She had diarrhea a couple of times.  Intermittent constipation.  States she was vomiting green and brown content. This has been intermittent for about 1 month. Nausea is daily. Vomiting maybe 3 times a week. States after taking "some pill" it makes her sick. Not sure what pill it is. Drinks thin liquids and regular diet. Occasional cough, but no swallowing trouble. Medications are given through  the tube. Has history of acid reflux and "ulcers" years ago. Trouble with greasy foods.  States PEJ tube was left in place to administer medications.  Not sure if she is taking warfarin at home. Takes tylenol.  Takes an aspirin. Not sure if she is taking PPI.   [phone]. [name]  (grand daughter with whom patient lives):  Has had vomiting for a few days. Reduced oral intake.  Used to have home healthcare, but they haven't been out in about 1 month. Speech therapy also working with her. Tube was only used for medications. States medications make her sick with she takes medications orally, this is why PEJ tube was left in place. Reports she has been having abdominal pain for about 3 days, worsened by eating. Couple of days of diarrhea before presenting to the hospital. Couple of BMs per day, runny. No blood in the stool or black stool. No constipation.   Supposed to be taking coumadin, but hasn't taken given it in about 1 month.  Not taking PPI? Patient has acid reflux. Reports as soon as she would eat or drink, she would immediately start vomiting/coughing/gagging.    Past Medical History:  Diagnosis Date  . COPD (chronic obstructive pulmonary disease) ([employer]) 04/23/2020  . Gastrojejunostomy tube status ([place]) 05/31/2020  . GERD (gastroesophageal reflux disease) 07/15/2020  . High cholesterol   . Hypercalcemia 07/15/2020  . Hyperparathyroidism ([place]) 05/31/2020  . Hypertension   . Left hemiplegia ([place]) 04/29/2020  . Non-rheumatic mitral valve stenosis 12/09/2017  . Paroxysmal A-fib ([place]) 04/23/2020  . S/P mitral valve replacement with metallic valve 0/25/8527   Formatting of this note might be different from the original. St. Jude regent mechanical  Procedures:   1. Mitral valve replacement (25 mm [employer] Jude Regent) (CPT 3341008609) 2. Tricuspid valve repair (annuloplasty only)  (30  mm CE Physio ring) (CPT 33464) 4. 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 Procedures:   1. Mitral valve replacement (25 mm [employer] Jude Regent) (CPT 361 [id number] [id number]) 2. Tricuspid valve repair (annuloplasty only)  (30  mm  CE Physio ring) (CPT 33464) 4. 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 Stroke [facility])     Past Surgical History:  Procedure Laterality Date  . ABDOMINAL HYSTERECTOMY    . CORONARY ARTERY BYPASS GRAFT    . GASTROSTOMY W/ FEEDING TUBE    . IR GJ TUBE CHANGE  07/22/2020  . JOINT REPLACEMENT      Prior to Admission medications   Medication Sig Start Date End Date Taking? Authorizing Provider  acetaminophen (TYLENOL) 325 MG tablet Place 1 tablet (325 mg total) into feeding tube every 6 (six) hours as needed for mild pain. 09/23/20  Yes Johnson, Clanford L, MD  amiodarone (PACERONE) 200 MG tablet Place 1 tablet (200 mg total) into feeding tube daily. 09/23/20 09/23/21 Yes Johnson, Clanford L, MD  aspirin 81 MG EC tablet Take 81 mg by mouth daily. 06/21/20  Yes [provider]  atorvastatin (LIPITOR) 40 MG tablet Take 40 mg by mouth daily. 06/21/20 06/21/21 Yes [provider]  carvedilol (COREG) 3.125 MG tablet Take 3.125 mg by mouth 2 (two) times daily. 10/02/20  Yes [provider]  Cholecalciferol 50 MCG (2000 UT) TABS Take 1 tablet by mouth daily. 06/21/20 06/21/21 Yes [provider]  furosemide (LASIX) 20 MG tablet Take 20 mg by mouth 2 (two) times daily. 09/19/20  Yes [provider]  mirtazapine (REMERON) 30 MG tablet Take 30 mg by mouth at bedtime. 07/10/20  Yes [provider]  Multiple Vitamin (MULTIVITAMIN WITH MINERALS) TABS tablet Place 1 tablet into feeding tube daily. 07/25/20  Yes Johnson, Clanford L, MD  omeprazole (PRILOSEC) 20 MG capsule Take 20 mg by mouth daily. 09/19/20  Yes [provider]  potassium & sodium phosphates (PHOS-NAK) 280-160-250 MG PACK Place 1 packet into feeding tube 4 (four) times daily -  with meals and at bedtime. 09/23/20  Yes Johnson, Clanford L, MD  senna-docusate (SENOKOT-S) 8.6-50 MG tablet Take 1 tablet by mouth 2 (two) times daily. 07/24/20  Yes Johnson, Clanford L, MD  sodium  bicarbonate 650 MG tablet Take 1 tablet (650 mg total) by mouth 2 (two) times daily. 07/24/20  Yes Johnson, Clanford L, MD  cinacalcet (SENSIPAR) 90 MG tablet Take by mouth. Patient not taking: No sig reported 10/02/20   [provider]  diclofenac Sodium (VOLTAREN) 1 % GEL Apply topically. Patient not taking: Reported on 10/13/2020 06/21/20 06/21/21  [provider]  Nutritional Supplements (FEEDING SUPPLEMENT, OSMOLITE 1.5 CAL,) LIQD Place 1,000 mLs into feeding tube continuous. 07/24/20   Johnson, Clanford L, MD  ondansetron (ZOFRAN) 4 MG tablet Take by mouth. Patient not taking: No sig reported 10/07/20   [provider]  pantoprazole (PROTONIX) 40 MG tablet Take 40 mg by mouth daily. Patient not taking: Reported on 09/18/2020    [provider]  traZODone (DESYREL) 50 MG tablet Take 50 mg by mouth daily. Patient not taking: Reported on 09/22/2020 08/26/20   [provider]  warfarin (COUMADIN) 2 MG tablet Take 2 mg by mouth daily. Patient not taking: Reported on 10/01/2020 06/21/20 06/21/21  [provider]    Current Facility-Administered Medications  Medication Dose Route Frequency Provider Last Rate Last Admin  . 0.9 %  sodium chloride infusion  250 mL Intravenous PRN Adefeso, Oladapo, DO   Stopped at 10/01/2020 1741  . 0.9 %  sodium chloride infusion  250 mL Intravenous Continuous Adefeso, Oladapo, DO      . 0.9 %  sodium chloride infusion   Intra-arterial PRN Manuella Ghazi, Pratik D, DO      . 0.9 %  sodium chloride infusion   Intravenous Continuous Heath Lark D, DO 150 mL/hr at Oct 20, 2020 1214 New Bag at 10/20/2020 1214  . amiodarone (PACERONE) tablet 200 mg  200 mg Per Tube Daily Adefeso, Oladapo, DO   200 mg at Oct 20, 2020 1308  . ascorbic acid (VITAMIN C) tablet 500 mg  500 mg Oral Daily Adefeso, Oladapo, DO   500 mg at 10-20-2020 1308  . ceFEPIme (MAXIPIME) 2 g in sodium chloride 0.9 % 100 mL IVPB  2 g Intravenous Q12H Shah, Pratik D, DO      .  Chlorhexidine Gluconate Cloth 2 % PADS 6 each  6 each Topical Daily Adefeso, Oladapo, DO   6 each at 10-20-20 1055  . dextrose 10 % infusion   Intravenous Continuous Adefeso, Oladapo, DO   Stopped at 09/24/2020 1345  . dextrose 50 % solution 50 mL  1 ampule Intravenous Once Adefeso, Oladapo, DO      . norepinephrine (LEVOPHED) 80m in 2563mpremix infusion  2-15 mcg/min Intravenous Titrated ShManuella GhaziPratik D, DO 56.3 mL/hr at [id number] 15 mcg/min at [id number]  . pantoprazole (PROTONIX) injection 40 mg  40 mg Intravenous Q12H Shah, Pratik D, DO   40 mg at [id number]. potassium chloride  10 mEq in 100 mL IVPB  10 mEq Intravenous Q1 Hr x 6 Heath Lark D, DO 100 mL/hr at 2020/11/14 1307 10 mEq at 11/14/20 1307  . prochlorperazine (COMPAZINE) injection 10 mg  10 mg Intravenous Q6H PRN Adefeso, Oladapo, DO      . sodium chloride flush (NS) 0.9 % injection 3 mL  3 mL Intravenous Q12H Adefeso, Oladapo, DO   3 mL at 11/14/2020 0838  . sodium chloride flush (NS) 0.9 % injection 3 mL  3 mL Intravenous PRN Adefeso, Oladapo, DO      . vasopressin (PITRESSIN) 20 Units in sodium chloride 0.9 % 100 mL infusion-*FOR SHOCK*  0-0.03 Units/min Intravenous Continuous Adefeso, Oladapo, DO 9 mL/hr at 2020/11/14 1207 0.03 Units/min at 11-14-20 1207  . zinc sulfate capsule 220 mg  220 mg Oral Daily Adefeso, Oladapo, DO   220 mg at 11/14/2020 1307    Allergies as of 09/23/2020 - Review Complete 09/22/2020  Allergen Reaction Noted  . Oxycodone Nausea Only 06/05/2020    Family History  Problem Relation Age of Onset  . Hypertension Mother   . Stroke Father   . Hypertension Father   . Cancer - Colon Brother   . AAA (abdominal aortic aneurysm) Brother   . Heart disease Daughter     Social History   Socioeconomic History  . Marital status: Single    Spouse name: Not on file  . Number of children: Not on file  . Years of education: Not on file  . Highest education level: Not on file  Occupational History  . Not  on file  Tobacco Use  . Smoking status: Never Smoker  . Smokeless tobacco: Never Used  Vaping Use  . Vaping Use: Never used  Substance and Sexual Activity  . Alcohol use: Never  . Drug use: Never  . Sexual activity: Not Currently  Other Topics Concern  . Not on file  Social History Narrative   Lives with daughter, granddaughter and pts boyfriend   Social Determinants of Health   Financial Resource Strain: Not on file  Food Insecurity: Not on file  Transportation Needs: Not on file  Physical Activity: Not on file  Stress: Not on file  Social Connections: Not on file  Intimate Partner Violence: Not on file    Review of Systems: Gen: Denies fever, chills, lightheadedness or dizziness. CV: Denies chest pain or palpitations. Resp: Denies shortness of breath.  Admits to intermittent cough. GI: See HPI Heme: See HPI  Physical Exam: Vital signs in last 24 hours: Temp:  [97.3 F (36.3 C)-98.6 F (37 C)] 98 F (36.7 C) (02/01 1230) Pulse Rate:  [81-110] 110 (02/01 1105) Resp:  [15-34] 32 (02/01 1230) BP: (40-166)/(20-97) 102/79 (02/01 1230) SpO2:  [95 %-100 %] 99 % (02/01 1105) Weight:  [64.1 kg] 64.1 kg (02/01 0034) Last BM Date: 09/25/2020 General:   Alert, chronically ill-appearing, pleasant and cooperative, no acute distress.  Left hemiplegia. Head:  Normocephalic and atraumatic. Eyes:  Sclera clear, no icterus.   Conjunctiva pink. Ears:  Normal auditory acuity. Nose:  No deformity, discharge,  or lesions. Mouth: Dry mucous membranes. Lungs:  Clear to auscultation anteriorly.  No wheezes, crackles, or rhonchi. No acute distress. Heart:  Regular rate and rhythm; no murmurs, clicks, rubs,  or gallops. Abdomen:  Soft and nondistended.  Mild TTP in RLQ and suprapubic area. No masses, hepatosplenomegaly or hernias noted.  Bowel sounds present, somewhat hypoactive.  No rebound or guarding.  Rectal:  Deferred  Msk:  Symmetrical without gross deformities.  Extremities:   Without edema. Neurologic:  Alert and  oriented x3 Skin:  Intact without significant lesions or rashes. Psych:  Normal mood and affect.  Intake/Output from previous day: 01/31 0701 - 02/01 0700 In: 8206.1 [I.V.:2166.7; Blood:744.5; IV Piggyback:5294.9] Out: 350 [Urine:350] Intake/Output this shift: Total I/O In: 1194.5 [I.V.:930.1; IV Piggyback:264.4] Out: 350 [Urine:350]  Lab Results: Recent Labs    10/06/2020 1256 10/04/2020 2242 10-19-2020 0803  WBC 8.8 11.0* 10.2  HGB 7.8* 6.6* 10.7*  HCT 23.8* 19.3* 32.2*  PLT 258 233 195   BMET Recent Labs    10/06/2020 1256 10/13/2020 2242 10/19/20 0803  NA 137 137 138  K 2.4* 2.4* 2.0*  CL 100 109 112*  CO2 22 16* 17*  GLUCOSE 362* 210* 172*  BUN _0 CREATININE 1.56* 1.47* 1.21*  CALCIUM 7.0* 6.3* 6.5*   LFT Recent Labs    19-Oct-2020 0803  PROT 5.2*  ALBUMIN 1.6*  AST 212*  ALT 81*  ALKPHOS 125  BILITOT 1.6*   PT/INR Recent Labs    09/16/2020 1256 10-19-2020 0803  LABPROT 22.2* 24.8*  INR 2.0* 2.3*   Hepatitis Panel No results for input(s): HEPBSAG, HCVAB, HEPAIGM, HEPBIGM in the last 72 hours. C-Diff Recent Labs    09/17/2020 1607  CDIFFTOX NEGATIVE    Studies/Results: CT Head Wo Contrast  Result Date: 09/20/2020 CLINICAL DATA:  Mental status change. EXAM: CT HEAD WITHOUT CONTRAST TECHNIQUE: Contiguous axial images were obtained from the base of the skull through the vertex without intravenous contrast. COMPARISON:  09/19/2020 FINDINGS: Brain: Encephalomalacia within the right frontal lobe is again noted compatible with remote infarct. Chronic right caudate nucleus lacunar infarct is unchanged. No acute infarct, hemorrhage, hydrocephalus, extra-axial collection or mass lesion/mass effect. Vascular: No hyperdense vessel or unexpected calcification. Skull: Normal. Negative for fracture or focal lesion. Sinuses/Orbits: Partial opacification of the right sphenoid sinus is again noted in appears unchanged. The remaining  paranasal sinuses and mastoid air cells are clear. Other: None. IMPRESSION: 1. No acute intracranial abnormalities. 2. 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 Electronically Signed   By: Kerby Moors M.D.   On: 09/25/2020 16:02   DG ABDOMEN PEG TUBE LOCATION  Result Date: October 19, 2020 CLINICAL DATA:  Abdominal pain, PEG tube placement, diarrhea EXAM: ABDOMEN - 1 VIEW COMPARISON:  09/19/2020 FINDINGS: Contrast has been instilled through an existing gastrojejunostomy catheter confirming the tip of the catheter within the a proximal jejunum beyond the ligament of Treitz. The visualized proximal small bowel is decompressed and demonstrates a a normal mucosal fold pattern. Normal abdominal gas pattern. No organomegaly. No gross free intraperitoneal gas. Osseous structures are unremarkable. IMPRESSION: Gastrojejunostomy catheter tip within the proximal jejunum. Normal abdominal gas pattern. Electronically Signed   By: Fidela Salisbury MD   On: October 19, 2020 11:12   DG Chest Port 1 View  Result Date: 10/04/2020 CLINICAL DATA:  65 year old female status post central line placement. EXAM: PORTABLE CHEST 1 VIEW COMPARISON:  Chest radiograph dated 09/26/2020 FINDINGS: Left subclavian central venous line with tip at the cavoatrial junction. No pneumothorax. No focal consolidation or pleural effusion. Stable cardiomediastinal silhouette. Median sternotomy wires and prostatic cardiac valve. No acute osseous pathology. IMPRESSION: Left subclavian central venous line with tip at the cavoatrial junction. No pneumothorax. Electronically Signed   By: Anner Crete M.D.   On: 10/13/2020 20:14   DG Chest Port 1 View  Result Date: 09/19/2020 CLINICAL DATA:  Sepsis.  COVID positive. EXAM: PORTABLE CHEST 1 VIEW COMPARISON:  09/19/2020 FINDINGS: Post median sternotomy. Prosthetic valves. Previous internal jugular catheter is been removed. Heart is normal in size. Stable mediastinal contours.  Minimal streaky opacities at the lung bases. No pulmonary edema. No pleural fluid, pneumothorax, or evidence of pneumomediastinum. No acute osseous abnormalities are seen. IMPRESSION: Minimal streaky bibasilar opacities, favor atelectasis. Electronically Signed   By: Keith Rake M.D.   On: 09/28/2020 18:39   US Abdomen Limited RUQ (LIVER/GB)  Result Date: 10-25-20 CLINICAL DATA:  Elevated LFTs EXAM: ULTRASOUND ABDOMEN LIMITED RIGHT UPPER QUADRANT COMPARISON:  CT abdomen pelvis July 23, 2020 FINDINGS: Gallbladder: Mobile sludge and stones in the gallbladder the largest of which measures 1 cm. No pericholecystic fluid or wall thickening visualized. No sonographic Murphy sign noted by sonographer. Common bile duct: Diameter: 4 mm Liver: No focal solid lesion identified. Diffusely increased parenchymal echogenicity. Multiple hepatic cysts the largest of which measures 4.6 cm. Portal vein is patent on color Doppler imaging with normal direction of blood flow towards the liver. Other: None. IMPRESSION: 1. The echogenicity of the liver is diffusely increased. This is a nonspecific finding but is most commonly seen with hepatic steatosis. There are no solid focal liver lesions identified. 2. Cholelithiasis and gallbladder sludge. No sonographic evidence of acute cholecystitis. Electronically Signed   By: Dahlia Bailiff MD   On: 25-Oct-2020 12:19    Impression: 65 year old female with multiple comorbidities including stroke in August 2021 with chronic left-sided hemiplegia and chronic gastrojejunostomy tube, GERD, hyperlipidemia, hyperparathyroidism, hypertension, atrial fibrillation, nonrheumatic mitral valve stenosis status post metallic valve replacement, severe TR s/p, chronic anticoagulation with warfarin who presented to the emergency department via EMS due to altered mental status and low blood glucose and hypotension in the setting of nausea, vomiting, and diarrhea. She is admitted with 7 shock in the  setting of UTI, C. difficile.  Also with COVID-19 infection and anemia with heme positive stool.  Currently on antibiotics, to pressors with norepinephrine and vasopressin, and received 2 unit PRBCs.  Anemia: Hemoglobin 7.8 on admission (similar to discharge hemoglobin of 8.1 on 09/23/2020), down to 6.6 evening of 1/31 s/p 2 unit PRBCs with posttransfusion hemoglobin today at 10.7.  No overt GI bleeding. BUN within normal limits. She was heme positive. INR elevated at 2.3 today although per grand daughter, patient hasn't taken warfarin in about 1 month. Suspect INR elevation may be secondary to significant malnutrition as albumin is 1.6. Denies NSAIDs.  No prior colonoscopy or EGD.  Unknown baseline hemoglobin.  In Care Everywhere, mild anemia with hemoglobin in 10-11 range dates back at least to 2011. Hemoglobin 11.0 in November but has been slowly trending down. Iron panel November 2021 more consistent with anemia of chronic disease with iron 19 (L), saturation 9% (L), and ferritin elevated at 564. B12 and folate wnl.   Suspect decline in hemoglobin this admission may have been secondary to hemodilution.  However, with heme positive stool, would recommend EGD and colonoscopy once over acute illness to further evaluate anemia.  Monitor for now with transfusion as needed, continue PPI BID. Will update iron panel.    Diarrhea: C. difficile GDH and toxin positive this admission.  Reported multiple bowel movements in the ED; however, no bowel movement since moving to the ICU over night.  Complains of abdominal cramping.  Very mild tenderness to palpation of the abdomen in the right lower quadrant and suprapubic area.  Oral vancomycin has been ordered, but has not been  initiated due to question regarding route of medication administration-p.o. versus per tube.  Will order abdominal x-ray to evaluate abdominal pain and PEJ tube location.  Nausea/vomiting: Possibly secondary to C. Difficile. May also be influenced  by history of GERD, not on a PPI at home. Reported vomiting something that was brown. With anemia, can't rule out PUD. She has not had any vomiting since moving to the ICU and has not required antiemetics. Continue to monitor, compazine prn, and IV PPI BID.   Elevated transaminases with elevated total bilirubin: Slight elevation of AST dating back to 07/23/2020.  On admission, AST 97, ALT 46, alk phos and total bilirubin normal.  Today, AST 212, ALT 81, total bilirubin 1.6.  Suspect this is multifactorial and more related to acute illness with persistent hypotension.  Will order RUQ ultrasound and acute hepatitis panel.  Continue to monitor.  Electrolyte abnormalities: Correction per hospitalist.  Sepsis with persistent hypotension: Management per hospitalist.   Plan: Abdominal x-ray. RUQ ultrasound. Acute hepatitis panel. Iron panel with ferritin.  Monitor LFTs. Continue to monitor H&H. Monitor for overt GI bleeding. Continue IV PPI twice daily. Continue vancomycin 125 mg 4 times daily per PEJ. EGD and colonoscopy once over acute illness.  Likely as outpatient.   LOS: 1 day    2020-10-26, 1:52 PM   Aliene Altes, [facility]

## 2020-11-12 NOTE — Death Summary Note (Signed)
Physician Discharge Summary  Annette Rogers HEN:[id number] DOB: 1955-12-07 DOA: Oct 22, 2020  PCP: Oneal Grout, FNP  Admit date: 10/22/20  Death date: 10-23-20 1510  Admitted From:Home  Disposition:  Expired  Brief/Interim Summary: Annette [place] Plunkett[place] a 65 y.o.femalewith medical history significant forprior stroke with chronic gastrostomy tube for nutrition and medications, GERD, hyperlipidemia, hyperparathyroidism, hypertension, chronic left-sided hemiplegia from CVA, nonrheumatic mitral valve stenosis status post metallic valve replacement, chronic anticoagulation with warfarin, history of tricuspid valve repairto the emergency department via EMS due to altered mental status and low blood glucose.   -Patient was admitted with septic shock in the setting of UTI and was being treated with cefepime.    There was also some initial concern of C. difficile infection and she was given oral vancomycin as well.  She was also noted to have COVID-19 infection.  She was started on 2 pressors with norepinephrine and vasopressin.  Additionally, she had acute blood loss anemia and was status post 2 unit PRBC infusion.    She started to have pain all throughout her body and blood pressure readings became quite altered between her 2 upper extremities.  A CODE BLUE was then called at 1348 and CPR was initiated.  Patient was also intubated during this time.  ROSC was achieved at 1358 and Covid was called again at 1432.  After further CPR, it appears that ROSC was achieved at 1438.  It was decided to discontinue further efforts of CPR should there be another cardiac arrest given the futility in pursuing further chest compressions.  She eventually expired at 1510.  Discharge Diagnoses:  Active Problems:   COPD (chronic obstructive pulmonary disease) (HCC)   GERD (gastroesophageal reflux disease)   Hyperparathyroidism (HCC)   Paroxysmal A-fib (HCC)   Hypokalemia   Hypomagnesemia   Hypotension   Dehydration    AKI (acute kidney injury) (HCC)   Hypoglycemia   COVID-19 virus infection   C. difficile diarrhea   Lactic acidosis   GI bleed   Acute metabolic encephalopathy   Prolonged QT interval   Abdominal pain   Nausea and vomiting  Principal diagnoses: Septic shock secondary to UTI.  Covid infection.  Acute blood loss anemia. AKI.   Allergies  Allergen Reactions  . Oxycodone Nausea Only    Consultations:  PCCM  GI   Procedures/Studies: CT Head Wo Contrast  Result Date: October 22, 2020 CLINICAL DATA:  Mental status change. EXAM: CT HEAD WITHOUT CONTRAST TECHNIQUE: Contiguous axial images were obtained from the base of the skull through the vertex without intravenous contrast. COMPARISON:  09/19/2020 FINDINGS: Brain: Encephalomalacia within the right frontal lobe is again noted compatible with remote infarct. Chronic right caudate nucleus lacunar infarct is unchanged. No acute infarct, hemorrhage, hydrocephalus, extra-axial collection or mass lesion/mass effect. Vascular: No hyperdense vessel or unexpected calcification. Skull: Normal. Negative for fracture or focal lesion. Sinuses/Orbits: Partial opacification of the right sphenoid sinus is again noted in appears unchanged. The remaining paranasal sinuses and mastoid air cells are clear. Other: None. IMPRESSION: 1. No acute intracranial abnormalities. 2. 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 Electronically Signed   By: Signa Kell M.D.   On: 10/22/20 16:02   CT HEAD WO CONTRAST  Result Date: 09/19/2020 CLINICAL DATA:  Headache EXAM: CT HEAD WITHOUT CONTRAST TECHNIQUE: Contiguous axial images were obtained from the base of the skull through the vertex without intravenous contrast. COMPARISON:  None. FINDINGS: Brain: Remote right frontal infarct noted. Remote lacunar infarct involving the right caudate  nucleus. No evidence of acute intracranial hemorrhage or infarct. No abnormal mass effect or midline  shift. No abnormal intra or extra-axial mass lesion or fluid collection. Ventricular size is normal. Cerebellum is unremarkable. Vascular: No asymmetric hyperdense vasculature at the skull base Skull: Intact Sinuses/Orbits: Moderate mucosal thickening within the right sphenoid sinus. Remaining paranasal sinuses are clear. Visualized orbits are unremarkable. Other: Mastoid air cells and middle ear cavities are clear. IMPRESSION: Multiple remote infarcts. No evidence of acute intracranial hemorrhage or infarct. Electronically Signed   By: Helyn Numbers MD   On: 09/19/2020 04:55   DG ABDOMEN PEG TUBE LOCATION  Result Date: 10/18/2020 CLINICAL DATA:  Abdominal pain, PEG tube placement, diarrhea EXAM: ABDOMEN - 1 VIEW COMPARISON:  09/19/2020 FINDINGS: Contrast has been instilled through an existing gastrojejunostomy catheter confirming the tip of the catheter within the a proximal jejunum beyond the ligament of Treitz. The visualized proximal small bowel is decompressed and demonstrates a a normal mucosal fold pattern. Normal abdominal gas pattern. No organomegaly. No gross free intraperitoneal gas. Osseous structures are unremarkable. IMPRESSION: Gastrojejunostomy catheter tip within the proximal jejunum. Normal abdominal gas pattern. Electronically Signed   By: Helyn Numbers MD   On: 11/11/2020 11:12   DG Chest Port 1 View  Addendum Date: 11/05/2020   ADDENDUM REPORT: 10/25/2020 15:04 ADDENDUM: Please note the left sided venous line is a IJ line. Electronically Signed   By: Elgie Collard M.D.   On: 10/26/2020 15:04   Result Date: 10/23/2020 CLINICAL DATA:  65 year old female status post central line placement. EXAM: PORTABLE CHEST 1 VIEW COMPARISON:  Chest radiograph dated November 04, 2020 FINDINGS: Left subclavian central venous line with tip at the cavoatrial junction. No pneumothorax. No focal consolidation or pleural effusion. Stable cardiomediastinal silhouette. Median sternotomy wires and prostatic cardiac  valve. No acute osseous pathology. IMPRESSION: Left subclavian central venous line with tip at the cavoatrial junction. No pneumothorax. Electronically Signed: By: Elgie Collard M.D. On: 2020/11/04 20:14   DG Chest Port 1 View  Result Date: November 04, 2020 CLINICAL DATA:  Sepsis.  COVID positive. EXAM: PORTABLE CHEST 1 VIEW COMPARISON:  09/19/2020 FINDINGS: Post median sternotomy. Prosthetic valves. Previous internal jugular catheter is been removed. Heart is normal in size. Stable mediastinal contours. Minimal streaky opacities at the lung bases. No pulmonary edema. No pleural fluid, pneumothorax, or evidence of pneumomediastinum. No acute osseous abnormalities are seen. IMPRESSION: Minimal streaky bibasilar opacities, favor atelectasis. Electronically Signed   By: Narda Rutherford M.D.   On: 2020-11-04 18:39   DG Chest Portable 1 View  Result Date: 09/19/2020 CLINICAL DATA:  Status post PICC placement History of hypertension CABG, mitral valve replacement EXAM: PORTABLE CHEST 1 VIEW COMPARISON:  None. FINDINGS: The heart size and mediastinal contours are within normal limits. Both lungs are clear. The visualized skeletal structures are unremarkable. Right IJ central venous catheter terminates at the cavoatrial junction. Postsurgical changes of CABG and valve replacement are seen. IMPRESSION: Right IJ central venous catheter in appropriate position. No pneumothorax. Electronically Signed   By: Acquanetta Belling M.D.   On: 09/19/2020 07:49   DG ABD ACUTE 2+V W 1V CHEST  Result Date: 09/19/2020 CLINICAL DATA:  Constipation, hypertension, COPD EXAM: DG ABDOMEN ACUTE WITH 1 VIEW CHEST COMPARISON:  None. FINDINGS: There is no evidence of dilated bowel loops or free intraperitoneal air. Gastrojejunostomy catheter appears looped with its tip within the expected proximal jejunum. Surgical clips noted within the mid abdomen. No radiopaque calculi or other significant radiographic abnormality is seen. Heart  size and  mediastinal contours are within normal limits. Coronary artery bypass grafting and mitral valve replacement have been performed. Both lungs are clear. IMPRESSION: Negative abdominal radiographs.  No acute cardiopulmonary disease. Electronically Signed   By: Helyn NumbersAshesh  Parikh MD   On: 09/19/2020 04:52   US Abdomen Limited RUQ (LIVER/GB)  Result Date: 10/25/2020 CLINICAL DATA:  Elevated LFTs EXAM: ULTRASOUND ABDOMEN LIMITED RIGHT UPPER QUADRANT COMPARISON:  CT abdomen pelvis July 23, 2020 FINDINGS: Gallbladder: Mobile sludge and stones in the gallbladder the largest of which measures 1 cm. No pericholecystic fluid or wall thickening visualized. No sonographic Murphy sign noted by sonographer. Common bile duct: Diameter: 4 mm Liver: No focal solid lesion identified. Diffusely increased parenchymal echogenicity. Multiple hepatic cysts the largest of which measures 4.6 cm. Portal vein is patent on color Doppler imaging with normal direction of blood flow towards the liver. Other: None. IMPRESSION: 1. The echogenicity of the liver is diffusely increased. This is a nonspecific finding but is most commonly seen with hepatic steatosis. There are no solid focal liver lesions identified. 2. Cholelithiasis and gallbladder sludge. No sonographic evidence of acute cholecystitis. Electronically Signed   By: Maudry MayhewJeffrey  Waltz MD   On: 10/20/2020 12:19      Discharge Exam: Vitals:   10/31/2020 1215 11/04/2020 1230  BP: (!) 72/58 102/79  Pulse:    Resp: (!) 34 (!) 32  Temp:  98 F (36.7 C)  SpO2:     Vitals:   11/02/2020 1200 10/16/2020 1215 10/22/2020 1230 10/28/2020 1400  BP: (!) 73/50 (!) 72/58 102/79   Pulse:      Resp: (!) 32 (!) 34 (!) 32   Temp:   98 F (36.7 C)   TempSrc:   Oral   SpO2:      Weight:      Height:    5' (1.524 m)     The results of significant diagnostics from this hospitalization (including imaging, microbiology, ancillary and laboratory) are listed below for reference.      Microbiology: Recent Results (from the past 240 hour(s))  SARS Coronavirus 2 by RT PCR (hospital order, performed in [facility] hospital lab) Nasopharyngeal Nasopharyngeal Swab     Status: Abnormal   Collection Time: Nov 01, 2020  4:07 PM   Specimen: Nasopharyngeal Swab  Result Value Ref Range Status   SARS Coronavirus 2 POSITIVE (A) NEGATIVE Final    Comment: RESULT CALLED TO, READ BACK BY AND VERIFIED WITH: HOLCOMB,R ON 03/02/21 AT 1750 BY LOY,C (NOTE) SARS-CoV-2 target nucleic acids are DETECTED  SARS-CoV-2 RNA is generally detectable in upper respiratory specimens  during the acute phase of infection.  Positive results are indicative  of the presence of the identified virus, but do not rule out bacterial infection or co-infection with other pathogens not detected by the test.  Clinical correlation with patient history and  other diagnostic information is necessary to determine patient infection status.  The expected result is negative.  Fact Sheet for Patients:   BoilerBrush.com.cyhttps://www.fda.gov/media/136312/download   Fact Sheet for Healthcare Providers:   https://pope.com/https://www.fda.gov/media/136313/download    This test is not yet approved or cleared by the [place] FDA and  has been authorized for detection and/or diagnosis of SARS-CoV-2 by FDA under an Emergency Use Authorization (EUA).  This EUA will remain in effect (meaning this  test can be used) for the duration of  the COVID-19 declaration under Section 564(b)(1) of the Act, 21 U.S.C. section 360-bbb-3(b)(1), unless the authorization is terminated or revoked sooner.  Performed at [facility], [address], [place], [place] [address]   C Difficile Quick Screen w PCR reflex     Status: Abnormal   Collection Time: 2020/11/11  4:07 PM   Specimen: STOOL  Result Value Ref Range Status   C Diff antigen POSITIVE (A) NEGATIVE Final   C Diff toxin NEGATIVE NEGATIVE Final   C Diff interpretation Results are indeterminate. See PCR results.   Final    Comment: Performed at [facility], [address] [place], [place] [address]  C. Diff by PCR, Reflexed     Status: Abnormal   Collection Time: 11-11-20  4:07 PM  Result Value Ref Range Status   Toxigenic C. Difficile by PCR POSITIVE (A) NEGATIVE Final    Comment: Positive for toxigenic C. difficile with little to no toxin production. Only treat if clinical presentation suggests symptomatic illness. Performed at [facility], [address], [place], [place] [address]   Culture, blood (single)     Status: None (Preliminary result)   Collection Time: 11-11-20  6:00 PM   Specimen: BLOOD RIGHT HAND  Result Value Ref Range Status   Specimen Description BLOOD RIGHT HAND  Final   Special Requests   Final    BOTTLES DRAWN AEROBIC AND ANAEROBIC Blood Culture adequate volume   Culture   Final    NO GROWTH < 24 HOURS Performed at [facility], [address], [place], [place] [address]    Report Status PENDING  Incomplete  MRSA PCR Screening     Status: None   Collection Time: 11/10/2020 12:04 AM   Specimen: Nasopharyngeal  Result Value Ref Range Status   MRSA by PCR NEGATIVE NEGATIVE Final    Comment:        The GeneXpert MRSA Assay (FDA approved for NASAL specimens only), is one component of a comprehensive MRSA colonization surveillance program. It is not intended to diagnose MRSA infection nor to guide or monitor treatment for MRSA infections. Performed at [facility], [address], [place], [place] [address]      Labs: BNP (last 3 results) No results for input(s): BNP in the last 8760 hours. Basic Metabolic Panel: Recent Labs  Lab 11/11/2020 1221 11/11/2020 1256 11/11/2020 2242 10/23/2020 0803  NA  --  137 137 138  K  --  2.4* 2.4* 2.0*  CL  --  100 109 112*  CO2  --  22 16* 17*  GLUCOSE  --  362* 210* 172*  BUN  --  21 18 17   CREATININE  --  1.56* 1.47* 1.21*  CALCIUM  --  7.0* 6.3* 6.5*  MG 1.2*  --   --  1.5*  PHOS  --   --   --  1.8*   Liver Function  Tests: Recent Labs  Lab 10/17/2020 0803  AST 212*  ALT 81*  ALKPHOS 125  BILITOT 1.6*  PROT 5.2*  ALBUMIN 1.6*   No results for input(s): LIPASE, AMYLASE in the last 168 hours. No results for input(s): AMMONIA in the last 168 hours. CBC: Recent Labs  Lab Nov 11, 2020 1256 11-11-2020 2242 11/01/2020 0803  WBC 8.8 11.0* 10.2  HGB 7.8* 6.6* 10.7*  HCT 23.8* 19.3* 32.2*  MCV 79.6* 77.5* 82.4  PLT 258 233 195   Cardiac Enzymes: No results for input(s): CKTOTAL, CKMB, CKMBINDEX, TROPONINI in the last 168 hours. BNP: Invalid input(s): POCBNP CBG: Recent Labs  Lab 11/11/2020 1855 11/09/2020 0429 10/26/2020 0756 10/26/2020 1206 11/03/2020 1356  GLUCAP 345*  132* 139* 158* 152*   D-Dimer No results for input(s): DDIMER in the last 72 hours. Hgb A1c No results for input(s): HGBA1C in the last 72 hours. Lipid Profile No results for input(s): CHOL, HDL, LDLCALC, TRIG, CHOLHDL, LDLDIRECT in the last 72 hours. Thyroid function studies No results for input(s): TSH, T4TOTAL, T3FREE, THYROIDAB in the last 72 hours.  Invalid input(s): FREET3 Anemia work up No results for input(s): VITAMINB12, FOLATE, FERRITIN, TIBC, IRON, RETICCTPCT in the last 72 hours. Urinalysis    Component Value Date/Time   COLORURINE YELLOW 10/19/2020 1146   APPEARANCEUR CLOUDY (A) 2020-10-19 1146   LABSPEC 1.009 10-19-20 1146   PHURINE 5.0 10-19-20 1146   GLUCOSEU >=500 (A) 10/19/2020 1146   HGBUR MODERATE (A) 10/19/20 1146   BILIRUBINUR NEGATIVE 2020-10-19 1146   KETONESUR 5 (A) Oct 19, 2020 1146   PROTEINUR 100 (A) 10/19/2020 1146   NITRITE NEGATIVE October 19, 2020 1146   LEUKOCYTESUR MODERATE (A) 2020-10-19 1146   Sepsis Labs Invalid input(s): PROCALCITONIN,  WBC,  LACTICIDVEN Microbiology Recent Results (from the past 240 hour(s))  SARS Coronavirus 2 by RT PCR (hospital order, performed in [facility] hospital lab) Nasopharyngeal Nasopharyngeal Swab     Status: Abnormal   Collection Time: Oct 19, 2020  4:07 PM    Specimen: Nasopharyngeal Swab  Result Value Ref Range Status   SARS Coronavirus 2 POSITIVE (A) NEGATIVE Final    Comment: RESULT CALLED TO, READ BACK BY AND VERIFIED WITH: HOLCOMB,R ON 19-Oct-2020 AT 1750 BY LOY,C (NOTE) SARS-CoV-2 target nucleic acids are DETECTED  SARS-CoV-2 RNA is generally detectable in upper respiratory specimens  during the acute phase of infection.  Positive results are indicative  of the presence of the identified virus, but do not rule out bacterial infection or co-infection with other pathogens not detected by the test.  Clinical correlation with patient history and  other diagnostic information is necessary to determine patient infection status.  The expected result is negative.  Fact Sheet for Patients:   BoilerBrush.com.cy   Fact Sheet for Healthcare Providers:   https://pope.com/    This test is not yet approved or cleared by the [place] FDA and  has been authorized for detection and/or diagnosis of SARS-CoV-2 by FDA under an Emergency Use Authorization (EUA).  This EUA will remain in effect (meaning this  test can be used) for the duration of  the COVID-19 declaration under Section 564(b)(1) of the Act, 21 U.S.C. section 360-bbb-3(b)(1), unless the authorization is terminated or revoked sooner.  Performed at [facility], [address] [place], [place] [address]   C Difficile Quick Screen w PCR reflex     Status: Abnormal   Collection Time: 10/19/20  4:07 PM   Specimen: STOOL  Result Value Ref Range Status   C Diff antigen POSITIVE (A) NEGATIVE Final   C Diff toxin NEGATIVE NEGATIVE Final   C Diff interpretation Results are indeterminate. See PCR results.  Final    Comment: Performed at [facility] [address], [place], [place] [address]  C. Diff by PCR, Reflexed     Status: Abnormal   Collection Time: October 19, 2020  4:07 PM  Result Value Ref Range Status   Toxigenic C. Difficile by PCR  POSITIVE (A) NEGATIVE Final    Comment: Positive for toxigenic C. difficile with little to no toxin production. Only treat if clinical presentation suggests symptomatic illness. Performed at [facility], [address] [place], [place] [address]   Culture, blood (single)     Status:  None (Preliminary result)   Collection Time: 10/03/2020  6:00 PM   Specimen: BLOOD RIGHT HAND  Result Value Ref Range Status   Specimen Description BLOOD RIGHT HAND  Final   Special Requests   Final    BOTTLES DRAWN AEROBIC AND ANAEROBIC Blood Culture adequate volume   Culture   Final    NO GROWTH < 24 HOURS Performed at [facility], [address], [place], [place] [address]    Report Status PENDING  Incomplete  MRSA PCR Screening     Status: None   Collection Time: Oct 20, 2020 12:04 AM   Specimen: Nasopharyngeal  Result Value Ref Range Status   MRSA by PCR NEGATIVE NEGATIVE Final    Comment:        The GeneXpert MRSA Assay (FDA approved for NASAL specimens only), is one component of a comprehensive MRSA colonization surveillance program. It is not intended to diagnose MRSA infection nor to guide or monitor treatment for MRSA infections. Performed at [facility], [address] [place], [place] [address]      Time coordinating discharge: 35 minutes  SIGNED:   Erick Blinks, DO [facility] 10-20-20, 3:32 PM  If 7PM-7AM, please contact night-coverage [email or web address]

## 2020-11-12 NOTE — Progress Notes (Addendum)
PROGRESS NOTE    Annette Rogers  IHK:[id number] DOB: August 09, 1956 DOA: October 17, 2020 PCP: Oneal Grout, FNP   Brief Narrative:   Annette Rogers is a 65 y.o. female with medical history significant for prior stroke with chronic gastrostomy tube for nutrition and medications, GERD, hyperlipidemia, hyperparathyroidism, hypertension, chronic left-sided hemiplegia from CVA, nonrheumatic mitral valve stenosis status post metallic valve replacement, chronic anticoagulation with warfarin, history of tricuspid valve repair to the emergency department via EMS due to altered mental status and low blood glucose.   -Patient admitted with septic shock in the setting of UTI and is currently on cefepime.  Also has COVID-19 infection.  Currently on 2 pressors with norepinephrine and vasopressin.  PCCM consulted to assist with management.  Also noted to have acute blood loss anemia status post 2 unit PRBC infusion.  GI following.  Assessment & Plan:   Active Problems:   COPD (chronic obstructive pulmonary disease) (HCC)   GERD (gastroesophageal reflux disease)   Hyperparathyroidism (HCC)   Paroxysmal A-fib (HCC)   Hypokalemia   Hypomagnesemia   Hypotension   Dehydration   AKI (acute kidney injury) (HCC)   Hypoglycemia   COVID-19 virus infection   C. difficile diarrhea   Lactic acidosis   GI bleed   Acute metabolic encephalopathy   Prolonged QT interval   Septic shock secondary to UTI -Urine culture, unfortunately not collected earlier-we will send for one now -Continue empirically on cefepime -Discontinue IV and p.o. vancomycin as MRSA PCR negative and C. difficile toxin negative -Continue aggressive IV fluid and monitor lactic acid -Monitor repeat labs -Continue pressors, central venous line placed to left IJ -Appreciate PCCM consultation  Acute metabolic encephalopathy secondary to above-improved -Multifactorial with septic shock as well as hypoglycemia on arrival -Appears to be improved and  at baseline  COVID-19 gastroenteritis -Chest x-ray without infiltrates -No indication for remdesivir Decadron at this time -C. difficile toxin negative and no significant leukocytosis noted  Acute blood loss anemia related to GI bleed -Improved after 2 unit PRBC infusion -Continue PPI IV twice daily -Currently n.p.o. pending GI evaluation -Possible dysphagia for which SLP evaluation pending, appears that GJ tube may not be functional -Holding warfarin  AKI-likely prerenal, related to above factors -Baseline 0.9-1 -Improving with IV fluid hydration -Urine output, nonoliguric  Hypomagnesemia/hypokalemia -Continue to replete and reevaluate  Prior CVA last year -Affects speech -Mostly bedbound and uses wheelchair at home -May have dysphagia will have SLP evaluation -Typically eats and gets meds through GJ tube  Paroxysmal atrial fibrillation -Currently in sinus rhythm -Holding beta-blocker due to soft blood pressure readings -Warfarin held due to GI bleed  COPD-stable -DuoNebs as needed  GERD -PPI twice daily as noted above   DVT prophylaxis:SCDs, warfarin held Code Status: Full Family Communication: Discussed with daughter Annette Rogers on phone 2/1 Disposition Plan:  Status is: Inpatient  Remains inpatient appropriate because:Hemodynamically unstable, Altered mental status, IV treatments appropriate due to intensity of illness or inability to take PO and Inpatient level of care appropriate due to severity of illness   Dispo: The patient is from: Home              Anticipated d/c is to: SNFvs home              Anticipated d/c date is: > 3 days              Patient currently is not medically stable to d/c.  Patient is still requiring high-dose pressors and IV antibiotics.  Difficult to place patient No   Nutritional Assessment:  The patient's BMI is: Body mass index is 27.6 kg/m.Marland Kitchen    Consultants:   PCCM  GI  Procedures:   See below  Antimicrobials:   Anti-infectives (From admission, onward)   Start     Dose/Rate Route Frequency Ordered Stop   10/16/20 2200  vancomycin (VANCOREADY) IVPB 1250 mg/250 mL  Status:  Discontinued        1,250 mg 166.7 mL/hr over 90 Minutes Intravenous Every 48 hours 10/02/2020 2118 10-26-2020 0925   10/26/20 1800  ceFEPIme (MAXIPIME) 2 g in sodium chloride 0.9 % 100 mL IVPB        2 g 200 mL/hr over 30 Minutes Intravenous Every 12 hours 10-26-2020 0944     2020/10/26 0600  ceFEPIme (MAXIPIME) 2 g in sodium chloride 0.9 % 100 mL IVPB  Status:  Discontinued        2 g 200 mL/hr over 30 Minutes Intravenous Every 12 hours 09/25/2020 2118 10-26-2020 0925   10/01/2020 2330  vancomycin (VANCOCIN) 50 mg/mL oral solution 125 mg  Status:  Discontinued        125 mg Oral 4 times daily 10/03/2020 2323 10/26/2020 0945   10/12/2020 1830  vancomycin (VANCOREADY) IVPB 1500 mg/300 mL        1,500 mg 150 mL/hr over 120 Minutes Intravenous  Once 10/13/2020 1817 Oct 26, 2020 0007   09/23/2020 1745  ceFEPIme (MAXIPIME) 2 g in sodium chloride 0.9 % 100 mL IVPB        2 g 200 mL/hr over 30 Minutes Intravenous  Once 09/23/2020 1734 10/09/2020 2034   09/20/2020 1745  metroNIDAZOLE (FLAGYL) IVPB 500 mg        500 mg 100 mL/hr over 60 Minutes Intravenous  Once 09/23/2020 1734 09/24/2020 2151   09/28/2020 1745  vancomycin (VANCOCIN) IVPB 1000 mg/200 mL premix  Status:  Discontinued        1,000 mg 200 mL/hr over 60 Minutes Intravenous  Once 09/26/2020 1734 09/17/2020 1817   09/14/2020 1500  cefTRIAXone (ROCEPHIN) 1 g in sodium chloride 0.9 % 100 mL IVPB        1 g 200 mL/hr over 30 Minutes Intravenous  Once 10/04/2020 1451 09/16/2020 1728       Subjective: Patient seen and evaluated today with no apparent acute complaints or concerns. No acute concerns or events noted since admission.   Objective: Vitals:   26-Oct-2020 1105 2020/10/26 1115 10/26/20 1145 Oct 26, 2020 1200  BP: 103/69 (!) 86/54 (!) 110/54 (!) 73/50  Pulse: (!) 110     Resp: (!) 30 (!) 29 (!) 33 (!) 32  Temp:       TempSrc:      SpO2: 99%     Weight:      Height:        Intake/Output Summary (Last 24 hours) at October 26, 2020 1208 Last data filed at 2020/10/26 1207 Gross per 24 hour  Intake 9400.66 ml  Output 700 ml  Net 8700.66 ml   Filed Weights   09/30/2020 1148 Oct 26, 2020 0034  Weight: 67 kg 64.1 kg    Examination:  General exam: Appears calm and comfortable  Respiratory system: Clear to auscultation. Respiratory effort normal. Cardiovascular system: S1 & S2 heard, RRR.  Gastrointestinal system: Abdomen is soft, GJ tube present with no induration or drainage Central nervous system: Alert and awake Extremities: No edema Skin: No significant lesions noted Psychiatry: Flat affect.    Data Reviewed: I have personally reviewed following labs  and imaging studies  CBC: Recent Labs  Lab 09/20/2020 1256 10/06/2020 2242 2020/10/22 0803  WBC 8.8 11.0* 10.2  HGB 7.8* 6.6* 10.7*  HCT 23.8* 19.3* 32.2*  MCV 79.6* 77.5* 82.4  PLT 258 233 195   Basic Metabolic Panel: Recent Labs  Lab 09/21/2020 1221 10/11/2020 1256 10/06/2020 2242 10/22/2020 0803  NA  --  137 137 138  K  --  2.4* 2.4* 2.0*  CL  --  100 109 112*  CO2  --  22 16* 17*  GLUCOSE  --  362* 210* 172*  BUN  --  21 18 17   CREATININE  --  1.56* 1.47* 1.21*  CALCIUM  --  7.0* 6.3* 6.5*  MG 1.2*  --   --  1.5*  PHOS  --   --   --  1.8*   GFR: Estimated Creatinine Clearance: 38.2 mL/min (A) (by C-G formula based on SCr of 1.21 mg/dL (H)). Liver Function Tests: Recent Labs  Lab Oct 22, 2020 0803  AST 212*  ALT 81*  ALKPHOS 125  BILITOT 1.6*  PROT 5.2*  ALBUMIN 1.6*   No results for input(s): LIPASE, AMYLASE in the last 168 hours. No results for input(s): AMMONIA in the last 168 hours. Coagulation Profile: Recent Labs  Lab 10/07/2020 1256 10/22/20 0803  INR 2.0* 2.3*   Cardiac Enzymes: No results for input(s): CKTOTAL, CKMB, CKMBINDEX, TROPONINI in the last 168 hours. BNP (last 3 results) No results for input(s): PROBNP in the  last 8760 hours. HbA1C: No results for input(s): HGBA1C in the last 72 hours. CBG: Recent Labs  Lab 10/13/2020 1335 09/22/2020 1855 22-Oct-2020 0429 10/22/2020 0756 Oct 22, 2020 1206  GLUCAP 405* 345* 132* 139* 158*   Lipid Profile: No results for input(s): CHOL, HDL, LDLCALC, TRIG, CHOLHDL, LDLDIRECT in the last 72 hours. Thyroid Function Tests: No results for input(s): TSH, T4TOTAL, FREET4, T3FREE, THYROIDAB in the last 72 hours. Anemia Panel: No results for input(s): VITAMINB12, FOLATE, FERRITIN, TIBC, IRON, RETICCTPCT in the last 72 hours. Sepsis Labs: Recent Labs  Lab 09/28/2020 1924 09/23/2020 2242 22-Oct-2020 0803 10-22-2020 1126  LATICACIDVEN 6.6* 5.8* 3.4* 4.7*    Recent Results (from the past 240 hour(s))  SARS Coronavirus 2 by RT PCR (hospital order, performed in [facility] hospital lab) Nasopharyngeal Nasopharyngeal Swab     Status: Abnormal   Collection Time: 10/06/2020  4:07 PM   Specimen: Nasopharyngeal Swab  Result Value Ref Range Status   SARS Coronavirus 2 POSITIVE (A) NEGATIVE Final    Comment: RESULT CALLED TO, READ BACK BY AND VERIFIED WITH: HOLCOMB,R ON 09/22/2020 AT 1750 BY LOY,C (NOTE) SARS-CoV-2 target nucleic acids are DETECTED  SARS-CoV-2 RNA is generally detectable in upper respiratory specimens  during the acute phase of infection.  Positive results are indicative  of the presence of the identified virus, but do not rule out bacterial infection or co-infection with other pathogens not detected by the test.  Clinical correlation with patient history and  other diagnostic information is necessary to determine patient infection status.  The expected result is negative.  Fact Sheet for Patients:   BoilerBrush.com.cy   Fact Sheet for Healthcare Providers:   https://pope.com/    This test is not yet approved or cleared by the [place] FDA and  has been authorized for detection and/or diagnosis of SARS-CoV-2  by FDA under an Emergency Use Authorization (EUA).  This EUA will remain in effect (meaning this  test can be used) for the duration of  the COVID-19 declaration  under Section 564(b)(1) of the Act, 21 U.S.C. section 360-bbb-3(b)(1), unless the authorization is terminated or revoked sooner.  Performed at [facility], [address] [place], [place] [address]   C Difficile Quick Screen w PCR reflex     Status: Abnormal   Collection Time: 12/05/2020  4:07 PM   Specimen: STOOL  Result Value Ref Range Status   C Diff antigen POSITIVE (A) NEGATIVE Final   C Diff toxin NEGATIVE NEGATIVE Final   C Diff interpretation Results are indeterminate. See PCR results.  Final    Comment: Performed at [facility], [address] [place], [place] [address]  C. Diff by PCR, Reflexed     Status: Abnormal   Collection Time: 12/05/2020  4:07 PM  Result Value Ref Range Status   Toxigenic C. Difficile by PCR POSITIVE (A) NEGATIVE Final    Comment: Positive for toxigenic C. difficile with little to no toxin production. Only treat if clinical presentation suggests symptomatic illness. Performed at [facility], [address] [place], [place] [address]   Culture, blood (single)     Status: None (Preliminary result)   Collection Time: 12/05/2020  6:00 PM   Specimen: BLOOD RIGHT HAND  Result Value Ref Range Status   Specimen Description BLOOD RIGHT HAND  Final   Special Requests   Final    BOTTLES DRAWN AEROBIC AND ANAEROBIC Blood Culture adequate volume   Culture   Final    NO GROWTH < 24 HOURS Performed at [facility], [address] [place], [place] [address]    Report Status PENDING  Incomplete  MRSA PCR Screening     Status: None   Collection Time: 10/22/2020 12:04 AM   Specimen: Nasopharyngeal  Result Value Ref Range Status   MRSA by PCR NEGATIVE NEGATIVE Final    Comment:        The GeneXpert MRSA Assay (FDA approved for NASAL specimens only), is one component of a comprehensive MRSA  colonization surveillance program. It is not intended to diagnose MRSA infection nor to guide or monitor treatment for MRSA infections. Performed at [facility], [address] [place], [place] [address]          Radiology Studies: CT Head Wo Contrast  Result Date: 23-May-2021 CLINICAL DATA:  Mental status change. EXAM: CT HEAD WITHOUT CONTRAST TECHNIQUE: Contiguous axial images were obtained from the base of the skull through the vertex without intravenous contrast. COMPARISON:  09/19/2020 FINDINGS: Brain: Encephalomalacia within the right frontal lobe is again noted compatible with remote infarct. Chronic right caudate nucleus lacunar infarct is unchanged. No acute infarct, hemorrhage, hydrocephalus, extra-axial collection or mass lesion/mass effect. Vascular: No hyperdense vessel or unexpected calcification. Skull: Normal. Negative for fracture or focal lesion. Sinuses/Orbits: Partial opacification of the right sphenoid sinus is again noted in appears unchanged. The remaining paranasal sinuses and mastoid air cells are clear. Other: None. IMPRESSION: 1. No acute intracranial abnormalities. 2. 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 Electronically Signed   By: Signa Kellaylor  Stroud M.D.   On: 009-Sep-2022 16:02   DG ABDOMEN PEG TUBE LOCATION  Result Date: 10/21/2020 CLINICAL DATA:  Abdominal pain, PEG tube placement, diarrhea EXAM: ABDOMEN - 1 VIEW COMPARISON:  09/19/2020 FINDINGS: Contrast has been instilled through an existing gastrojejunostomy catheter confirming the tip of the catheter within the a proximal jejunum beyond the ligament of Treitz. The visualized proximal small bowel is decompressed and demonstrates a a normal mucosal fold pattern. Normal abdominal gas  pattern. No organomegaly. No gross free intraperitoneal gas. Osseous structures are unremarkable. IMPRESSION: Gastrojejunostomy catheter tip within the proximal jejunum. Normal abdominal gas pattern.  Electronically Signed   By: Helyn Numbers MD   On: Nov 09, 2020 11:12   DG Chest Port 1 View  Result Date: 10/11/2020 CLINICAL DATA:  65 year old female status post central line placement. EXAM: PORTABLE CHEST 1 VIEW COMPARISON:  Chest radiograph dated 09/30/2020 FINDINGS: Left subclavian central venous line with tip at the cavoatrial junction. No pneumothorax. No focal consolidation or pleural effusion. Stable cardiomediastinal silhouette. Median sternotomy wires and prostatic cardiac valve. No acute osseous pathology. IMPRESSION: Left subclavian central venous line with tip at the cavoatrial junction. No pneumothorax. Electronically Signed   By: Elgie Collard M.D.   On: 10/13/2020 20:14   DG Chest Port 1 View  Result Date: 09/25/2020 CLINICAL DATA:  Sepsis.  COVID positive. EXAM: PORTABLE CHEST 1 VIEW COMPARISON:  09/19/2020 FINDINGS: Post median sternotomy. Prosthetic valves. Previous internal jugular catheter is been removed. Heart is normal in size. Stable mediastinal contours. Minimal streaky opacities at the lung bases. No pulmonary edema. No pleural fluid, pneumothorax, or evidence of pneumomediastinum. No acute osseous abnormalities are seen. IMPRESSION: Minimal streaky bibasilar opacities, favor atelectasis. Electronically Signed   By: Narda Rutherford M.D.   On: 10/13/2020 18:39        Scheduled Meds: . amiodarone  200 mg Per Tube Daily  . vitamin C  500 mg Oral Daily  . Chlorhexidine Gluconate Cloth  6 each Topical Daily  . dextrose  1 ampule Intravenous Once  . pantoprazole (PROTONIX) IV  40 mg Intravenous Q12H  . sodium chloride flush  3 mL Intravenous Q12H  . zinc sulfate  220 mg Oral Daily   Continuous Infusions: . sodium chloride Stopped (09/23/2020 1741)  . sodium chloride 150 mL/hr at 11-09-20 1207  . sodium chloride    . sodium chloride    . sodium chloride    . ceFEPime (MAXIPIME) IV    . dextrose Stopped (10/07/2020 1345)  . norepinephrine (LEVOPHED) Adult infusion  16 mcg/min (2020-11-09 1207)  . potassium chloride 100 mL/hr at 09-Nov-2020 1207  . vasopressin 0.03 Units/min (11/09/20 1207)     LOS: 1 day    Time spent: 35 minutes    Miosotis Wetsel D Sherryll Burger, DO [facility]  If 7PM-7AM, please contact night-coverage [email or web address] Nov 09, 2020, 12:08 PM

## 2020-11-12 NOTE — Progress Notes (Signed)
Initial Nutrition Assessment  DOCUMENTATION CODES:   Not applicable  INTERVENTION:  Once hemodynamically stable, recommend initiating  -Vital 1.2 @ 20 ml/hr, advance 10 ml every 8 hours as tolerated to goal rate 60 ml/hr (1440 ml/day)  Provides 1728 kcal, 108 grams protein, and 1166 ml free water   Given significant electrolyte abnormalities, pt at high risk for refeeding, recommend continuing to monitor magnesium, potassium, and phosphorus daily as tube feedings progress towards goal   NUTRITION DIAGNOSIS:   Inadequate oral intake related to inability to eat as evidenced by NPO status.    GOAL:   Patient will meet greater than or equal to 90% of their needs    MONITOR:   Labs,I & O's,Skin,Diet advancement,Weight trends  REASON FOR ASSESSMENT:   Malnutrition Screening Tool    ASSESSMENT:  65 year old female with acute metabolic encephalopathy and COVID-19 gastroenteritis presented with low blood glucose and altered mental status. Past medical history significant of prior stoke with chronic left sided hemiplegia, G-tube dependent for nutrition and medications, GERD, HLD, hyperparathyroidism, HTN, nonrheumatic mitral valve stenosis s/p MV replacement atrial fibrillation on chronic anticoagulation with warfarin, COPD, and recent hospital admission 1/6-1/10 due to acute renal failure, hypotension, hypokalemia, and hypomagnesemia.  RD working remotely.  Pt reported to have been sick with diarrhea for the past 2 weeks. Noted hypotensive in ED, central line placed, pt unresponsive to IV hydration and started on IV pressors to maintain MAP >/= 65.  Pt is familiar to nutrition department due to frequent admissions and was seen by this RD during last admission on 1/07. Pt is unable to meet nutrition/hydration needs orally s/p CVA and supplements with tube feeding via G-tube to meet nutrition, hydration needs. During last admission, pt reported recently switching to a new formula, but  did not tolerate well. She was unable to recall what she was using at home. Per recent outpt palliative note on 1/24, family reports they are not using tube secondary to pt refusing feeds and endorsed very poor appetite and oral intake.  Per chart, weights have trended down 14 lbs (8.6%) over the last 3 months and 7 months (4.8%) in the past 3 weeks; significant. Highly suspect malnutrition, however unable to identify at this time. Will complete exam at follow-up. During prior admissions, pt receiving Osmolite 1.5 formula which she tolerated well. Once hemodynamically stable, recommend restarting tube feedings with regimen as outlined above to help her meet her needs.   BP: 156/17 (cuff) MAP: 63 (cuff) I/Os: +7856 ml since admit UOP: 350 ml x 24 hrs  Medications reviewed and include: Vit C, IV Protonix 40 mg every 12 hours, Zinc sulfate Drips: Maxipime Levo @ 60 ml/hr Vasopressin @ 9 ml/hr KCl 10 mEq in 100 ml every 1 hr x 6 D10 - stopped  IVF: NaCl @ 150 ml/hr  Labs: CBGs 158,139,132,345,405, K 2 (L), P 1.8 (L), Mg 1.5 (L), Cr 1.21 (H), AST 212 (H), ALT 81 (H), Hgb 10.7 (L)   NUTRITION - FOCUSED PHYSICAL EXAM:  Unable to complete at this time  Diet Order:   Diet Order            Diet NPO time specified  Diet effective midnight                 EDUCATION NEEDS:   No education needs have been identified at this time  Skin:  Skin Assessment: Reviewed RN Assessment  Last BM:  2/1 type 7  Height:   Ht Readings  from Last 1 Encounters:  Nov 09, 2020 5' (1.524 m)    Weight:   Wt Readings from Last 1 Encounters:  11/05/2020 64.1 kg    BMI:  Body mass index is 27.6 kg/m.  Estimated Nutritional Needs:   Kcal:  1800-2000  Protein:  85-100  Fluid:  >1.6 L   Lars Masson, RD, LDN Clinical Nutrition After Hours/Weekend Pager # in Amion

## 2020-11-12 NOTE — Progress Notes (Signed)
Results for KYNLI, CHOU (MRN [id number]) as of 10/22/2020 09:33  Ref. Range 11/09/2020 08:03  Potassium Latest Ref Range: 3.5 - 5.1 mmol/L 2.0 (LL)    Notified Dr. Sherryll Burger. New orders received.

## 2020-11-12 NOTE — Progress Notes (Signed)
[facility]  CXR with no PTX. The CVL is actually in the L IJ and not subclavian. I have notified radiology that the read is incorrect.  Algis Greenhouse, MD [facility] [address] [place], [place] [address] [phone] (office)

## 2020-11-12 NOTE — Progress Notes (Signed)
CODE BLUE called at 1348 & CPR initiated. ROSC achieved at 1358.   CODE BLUE called again at 1432. Code stopped by MD at 1438. Orders received to do not escalate.   TOD 1510.

## 2020-11-12 NOTE — Consult Note (Signed)
NAME:  Annette Rogers, MRN:  [id number], DOB:  [birth year], LOS: 1 ADMISSION DATE:  Nov 06, 2020, CONSULTATION DATE:  10/15/2020 REFERRING MD:  Dr. Sherryll Burger, [facility], CHIEF COMPLAINT:  Diarrhea   Brief History:  65 yo female presented to the ER with AMS, hypoglycemia, diarrhea, vomiting.  Positive for COVID 19.  Also positive for C diff antigen and PCR, but not toxin.  Found to have anemia, low electrolytes, gap/non gap metabolic acidosis.  Developed PEA cardiac arrest on 2/01 and required intubation.  Hx from chart and medical team.  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 rate (!) 32, height 5' (1.524 m), weight 64.1 kg, SpO2 99 %.    Vent Mode: PRVC FiO2 (%):  [100 %] 100 % Set Rate:  [26 bmp] 26 bmp Vt Set:  [420 mL] 420 mL PEEP:  [5 cmH20] 5 cmH20 Plateau Pressure:  [21 cmH20] 21 cmH20   Intake/Output Summary (Last 24 hours) at 10/27/2020 1428 Last data filed at 11/11/2020 1207 Gross per 24 hour  Intake 8325.66 ml  Output 700 ml  Net 7625.66 ml   Filed Weights   11/06/20 1148 10/19/2020 0034  Weight: 67 kg 64.1 kg    Examination:  General - unresponsive Eyes - pupils dilated ENT - ETT in place, bloody secretions from tube Cardiac - irregular Chest - b/l crackles Abdomen - soft, absent bowel sounds Extremities - cool Skin - no rashes Neuro - plegic on Lt   Assessment:  PEA  cardiac arrest Acute hypoxic respiratory failure Nausea, vomiting, diarrhea COVID 19 positive C diff PCR and Antigen positive, toxin negative Acute metabolic encephalopathy 2nd to hypoxic encephalopathy and hypoglycemia Hypokalemia, hypomagnesemia, hypophosphatemia Hypoglycemia Acute blood loss anemia  Plan:  She has suffered cardiac arrest two with PEA.  Total time thus far for CPR about 20 minutes.  I advised against continued CPR if/when she develops cardiac arrest again.  Discussed with Dr. Sherryll Burger.  Labs   CBC: Recent Labs  Lab 11-06-2020 1256 11/06/2020 2242 11/07/2020 0803  WBC 8.8 11.0* 10.2  HGB 7.8* 6.6* 10.7*  HCT 23.8* 19.3* 32.2*  MCV 79.6* 77.5* 82.4  PLT 258 233 195    Basic Metabolic Panel: Recent Labs  Lab 2020/11/06 1221 2020/11/06 1256 11-06-2020 2242 11/03/2020 0803  NA  --  137 137 138  K  --  2.4* 2.4* 2.0*  CL  --  100 109 112*  CO2  --  22 16* 17*  GLUCOSE  --  362* 210* 172*  BUN  --  21 18 17   CREATININE  --  1.56* 1.47* 1.21*  CALCIUM  --  7.0* 6.3* 6.5*  MG 1.2*  --   --  1.5*  PHOS  --   --   --  1.8*   GFR: Estimated Creatinine Clearance: 38.2 mL/min (A) (by C-G formula based on SCr of  1.21 mg/dL (H)). Recent Labs  Lab 10/08/2020 1256 10/02/2020 1456 09/24/2020 1924 09/25/2020 2242 11/14/2020 0803 11/14/20 1126  WBC 8.8  --   --  11.0* 10.2  --   LATICACIDVEN  --    < > 6.6* 5.8* 3.4* 4.7*   < > = values in this interval not displayed.    Liver Function Tests: Recent Labs  Lab 11/14/2020 0803  AST 212*  ALT 81*  ALKPHOS 125  BILITOT 1.6*  PROT 5.2*  ALBUMIN 1.6*   No results for input(s): LIPASE, AMYLASE in the last 168 hours. No results for input(s): AMMONIA in the last 168 hours.  ABG No results found for: PHART, PCO2ART, PO2ART, HCO3, TCO2, ACIDBASEDEF, O2SAT   Coagulation Profile: Recent Labs  Lab 10/05/2020 1256 11/14/2020 0803  INR 2.0* 2.3*    Cardiac Enzymes: No results for input(s): CKTOTAL, CKMB, CKMBINDEX, TROPONINI in  the last 168 hours.  HbA1C: No results found for: HGBA1C  CBG: Recent Labs  Lab 10/10/2020 1855 2020-11-14 0429 14-Nov-2020 0756 2020/11/14 1206 November 14, 2020 1356  GLUCAP 345* 132* 139* 158* 152*    Review of Systems:   Unable to obtain.  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   Surgical History:   Past Surgical History:  Procedure Laterality Date  . ABDOMINAL HYSTERECTOMY    . CORONARY ARTERY BYPASS GRAFT    . GASTROSTOMY W/ FEEDING TUBE    . IR GJ TUBE CHANGE  07/22/2020  . JOINT REPLACEMENT       Social History:   reports that she has never smoked. She has never used smokeless tobacco. She reports that she does not drink alcohol and does not use drugs.   Family History:  Her family history includes AAA (abdominal aortic aneurysm) in her brother; Cancer - Colon in her brother; Heart disease in her daughter; Hypertension in her father and mother; Stroke in her father.   Allergies Allergies  Allergen Reactions  . Oxycodone Nausea Only     Home Medications  Prior to Admission medications   Medication Sig Start Date End Date Taking? Authorizing Provider  acetaminophen (TYLENOL) 325 MG tablet Place 1 tablet (325 mg total) into feeding tube every 6 (six) hours as needed for mild pain. 09/23/20  Yes Johnson, Clanford L, MD  amiodarone (PACERONE) 200 MG tablet Place 1 tablet (200 mg total) into feeding tube daily. 09/23/20 09/23/21 Yes Johnson, Clanford L, MD  aspirin 81  MG EC tablet Take 81 mg by mouth daily. 06/21/20  Yes [provider]  atorvastatin (LIPITOR) 40 MG tablet Take 40 mg by mouth daily. 06/21/20 06/21/21 Yes [provider]  carvedilol (COREG) 3.125 MG tablet Take 3.125 mg by mouth 2 (two) times daily. 10/02/20  Yes [provider]  Cholecalciferol 50 MCG (2000 UT) TABS Take 1 tablet by mouth daily. 06/21/20 06/21/21 Yes [provider]  furosemide (LASIX) 20 MG tablet Take 20 mg by mouth 2 (two) times daily. 09/19/20  Yes [provider]  mirtazapine (REMERON) 30 MG tablet Take 30 mg by mouth at bedtime. 07/10/20  Yes [provider]  Multiple Vitamin (MULTIVITAMIN WITH MINERALS) TABS tablet Place  1 tablet into feeding tube daily. 07/25/20  Yes Johnson, Clanford L, MD  omeprazole (PRILOSEC) 20 MG capsule Take 20 mg by mouth daily. 09/19/20  Yes [provider]  potassium & sodium phosphates (PHOS-NAK) 280-160-250 MG PACK Place 1 packet into feeding tube 4 (four) times daily -  with meals and at bedtime. 09/23/20  Yes Johnson, Clanford L, MD  senna-docusate (SENOKOT-S) 8.6-50 MG tablet Take 1 tablet by mouth 2 (two) times daily. 07/24/20  Yes Johnson, Clanford L, MD  sodium bicarbonate 650 MG tablet Take 1 tablet (650 mg total) by mouth 2 (two) times daily. 07/24/20  Yes Johnson, Clanford L, MD  cinacalcet (SENSIPAR) 90 MG tablet Take by mouth. Patient not taking: No sig reported 10/02/20   [provider]  diclofenac Sodium (VOLTAREN) 1 % GEL Apply topically. Patient not taking: Reported on 28-Oct-2020 06/21/20 06/21/21  [provider]  Nutritional Supplements (FEEDING SUPPLEMENT, OSMOLITE 1.5 CAL,) LIQD Place 1,000 mLs into feeding tube continuous. 07/24/20   Johnson, Clanford L, MD  ondansetron (ZOFRAN) 4 MG tablet Take by mouth. Patient not taking: No sig reported 10/07/20   [provider]  pantoprazole (PROTONIX) 40 MG tablet Take 40 mg by mouth daily. Patient not  taking: Reported on October 28, 2020    [provider]  traZODone (DESYREL) 50 MG tablet Take 50 mg by mouth daily. Patient not taking: Reported on 10/28/20 08/26/20   [provider]  warfarin (COUMADIN) 2 MG tablet Take 2 mg by mouth daily. Patient not taking: Reported on 28-Oct-2020 06/21/20 06/21/21  [provider]     Signature:  Coralyn Helling, MD [facility] Pager - [phone] 11/02/2020, 2:54 PM

## 2020-11-12 DEATH — deceased

## 2021-09-11 IMAGING — DX DG LUMBAR SPINE 2-3V
3 series · 3 of 3 positions shown · non-contrast
Comparison: None.

CLINICAL DATA: Back pain

EXAM:
LUMBAR SPINE - 2-3 VIEW

[l-spine ap]
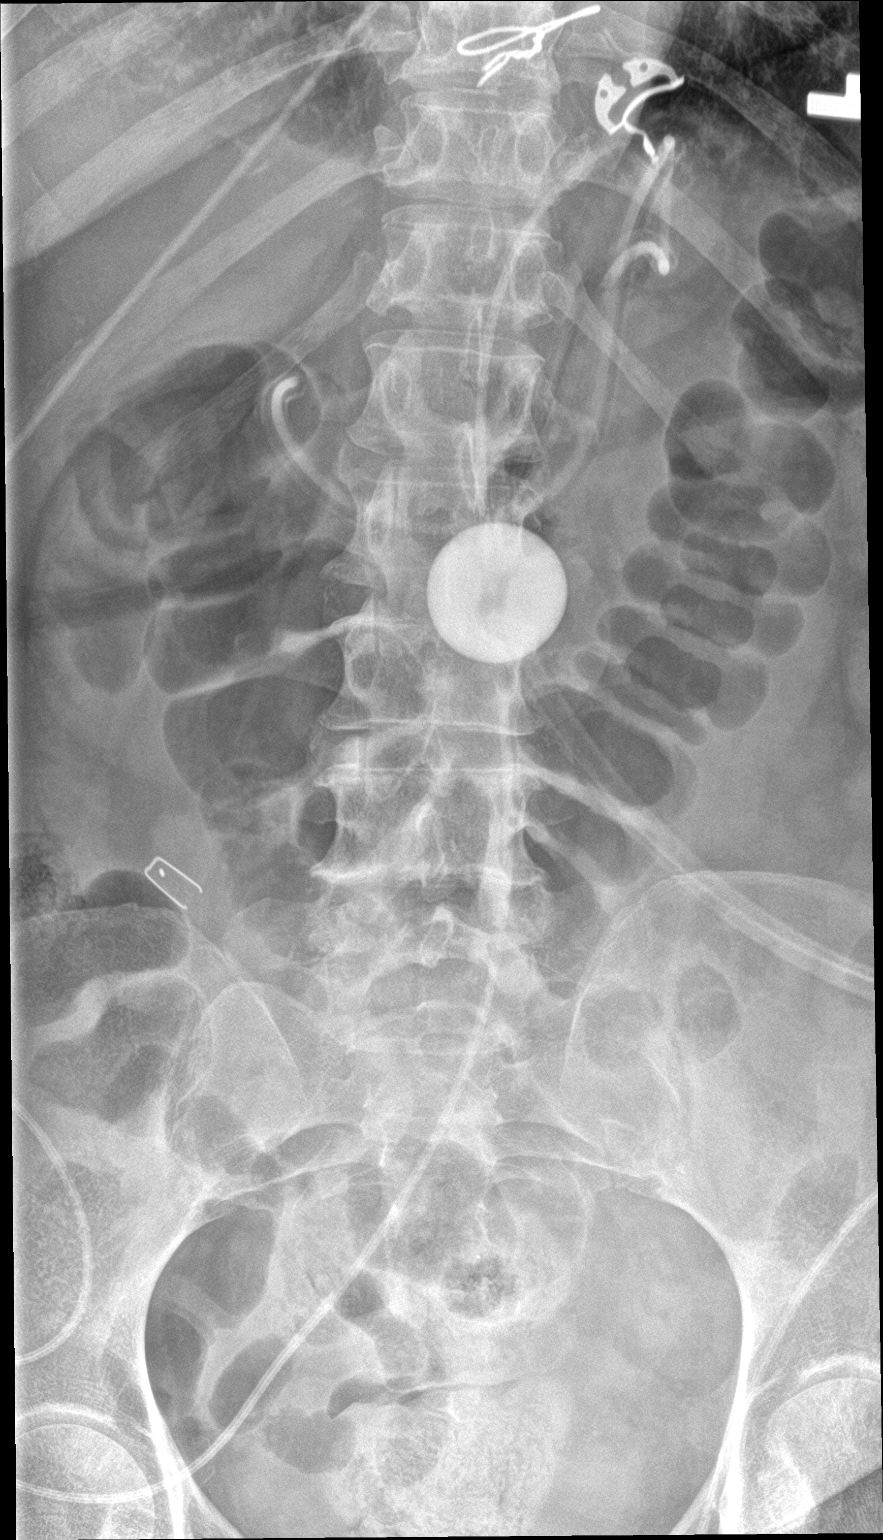

[l-spine lat]
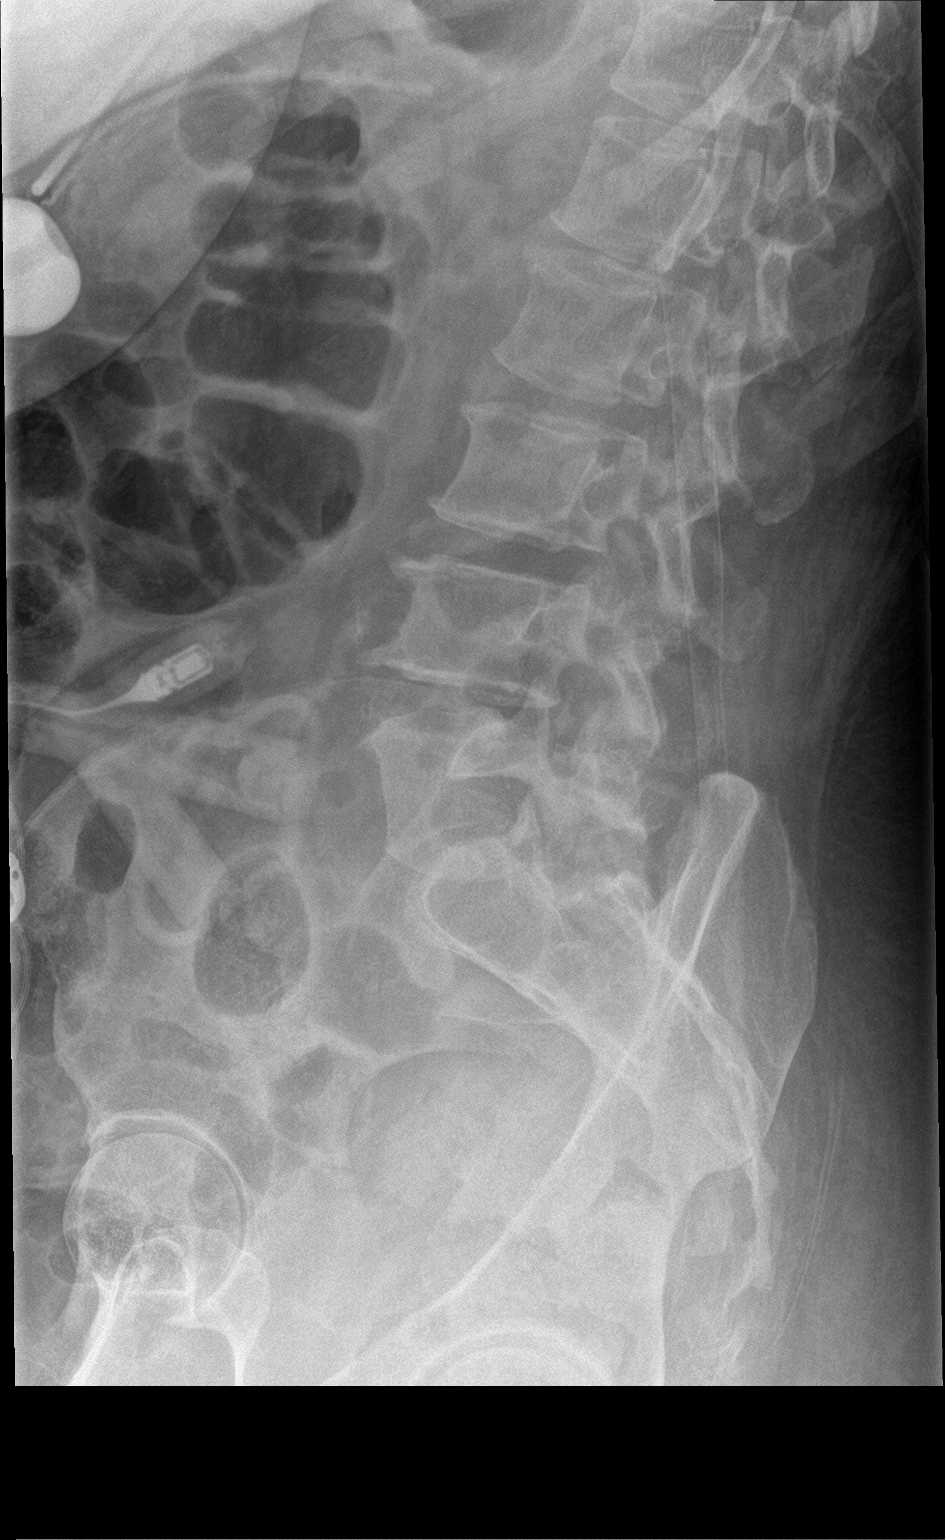

[l-spine spot]
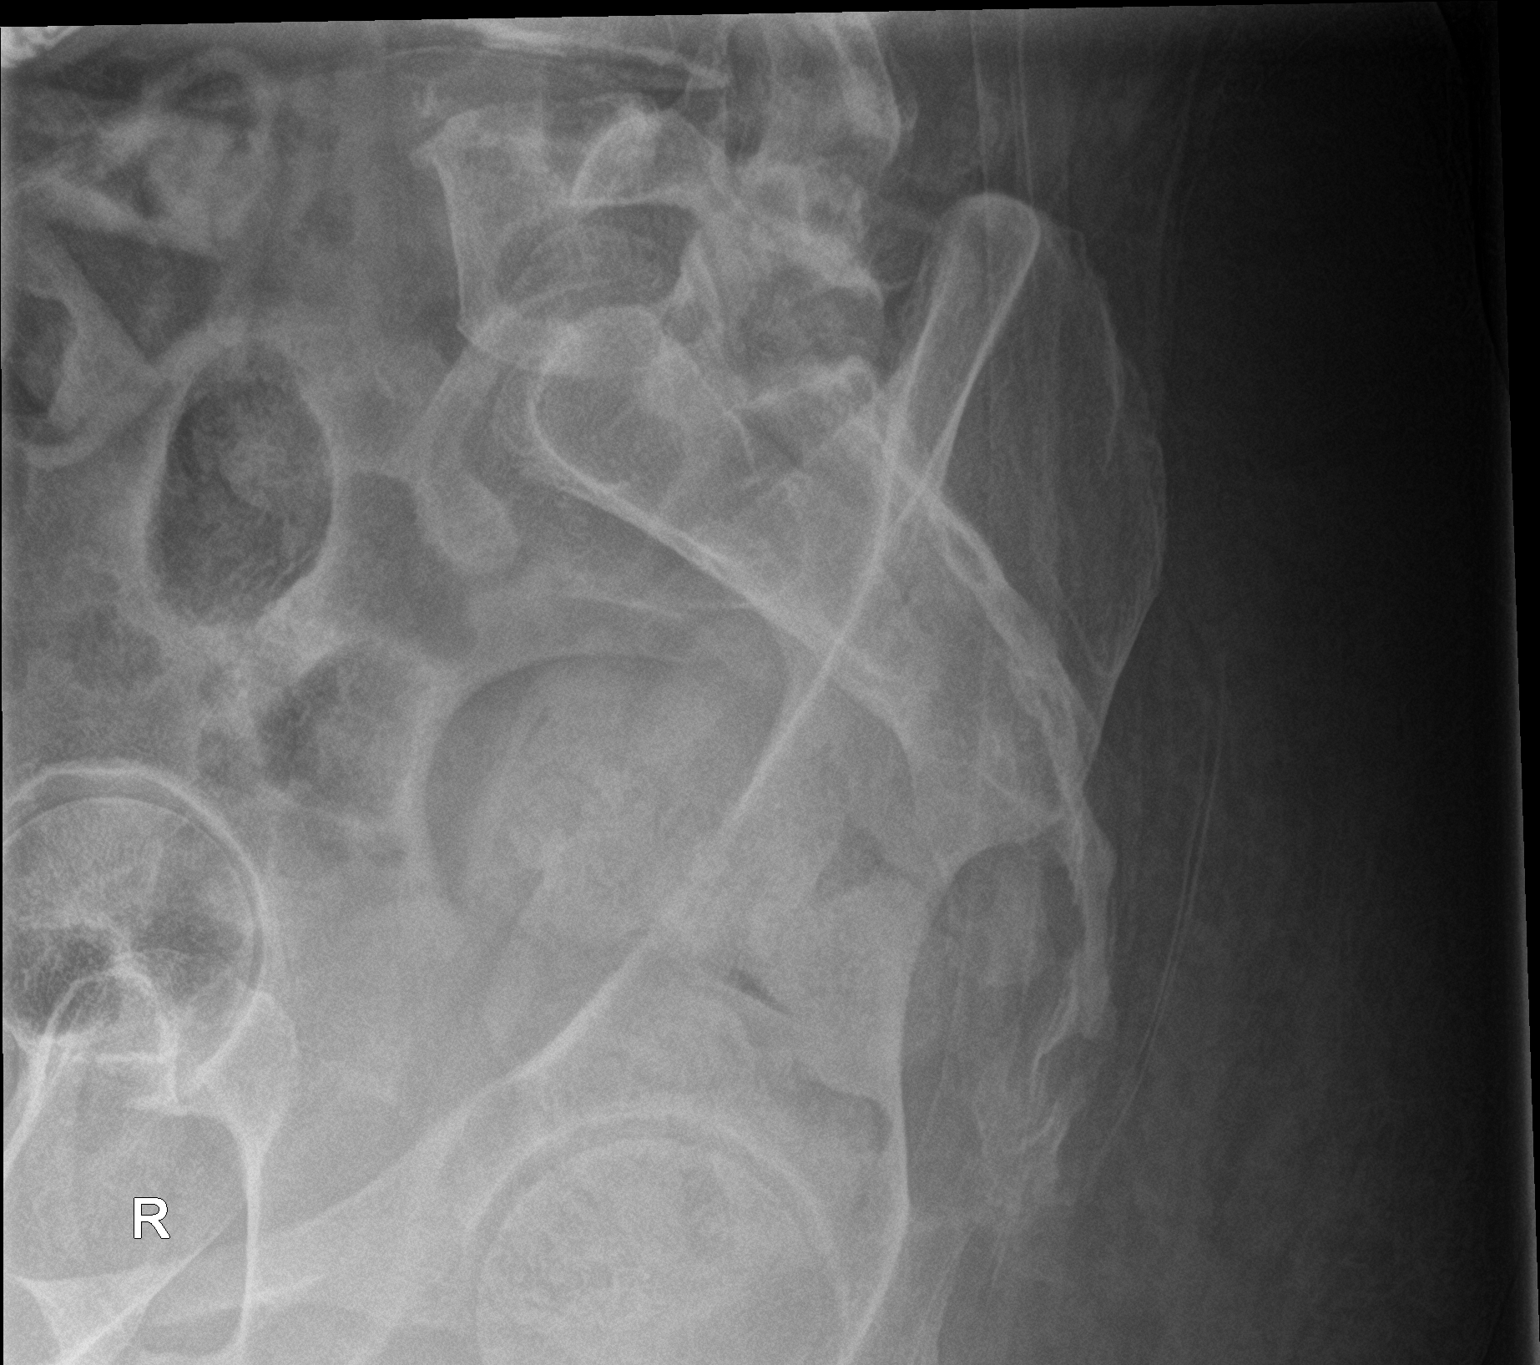

[3 of 3 positions shown; findings below may reference images not displayed]

FINDINGS: Normal alignment. No fracture. Degenerative spurring in the mid and
lower lumbar spine. Mild degenerative facet disease in the lower
lumbar spine. No fracture cleat that SI joints symmetric and
unremarkable. Gastrojejunostomy tube in place. The tip appears to be
in the proximal duodenum.
IMPRESSION: No acute bony abnormality.

## 2021-09-16 IMAGING — XA IR REPLACE G/J TUBE W/ FLUORO
2 series · 8 of 8 positions shown · non-contrast
Comparison: none

INDICATION: Malpositioned and poorly functioning gastrojejunal feeding tube with
nausea and vomiting.

[Series 1: fl (-) angio · 4 of 19 frames shown (1 of 2)]
[frame 3/19]
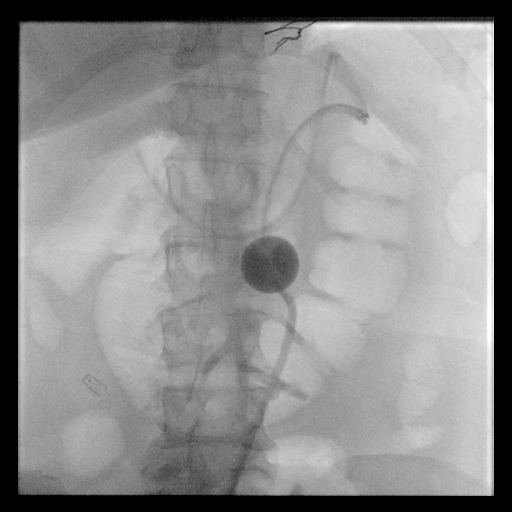
[frame 10/19]
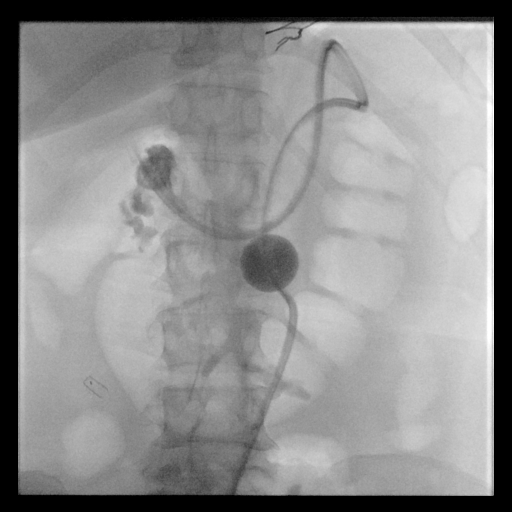
[frame 17/19]
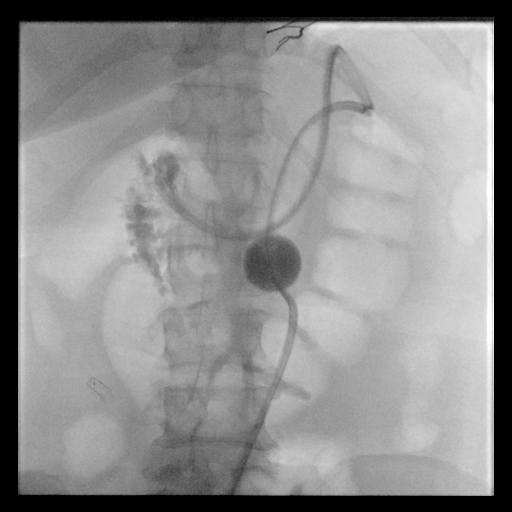
[frame 18/19]
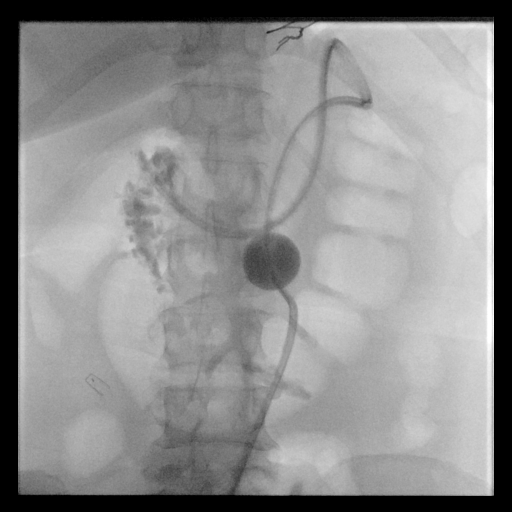

[Series 2: fl (-) angio · 4 of 16 frames shown (2 of 2)]
[frame 3/16]
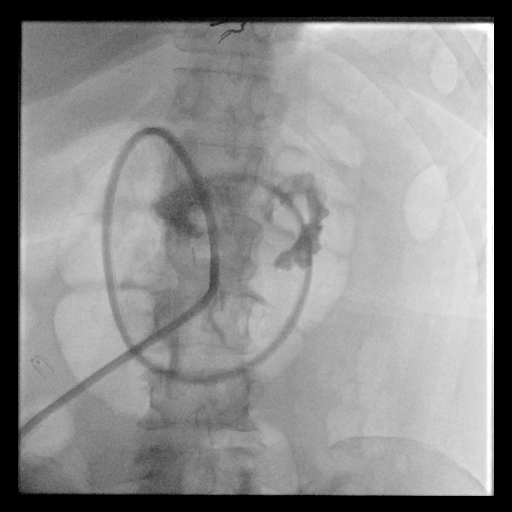
[frame 8/16]
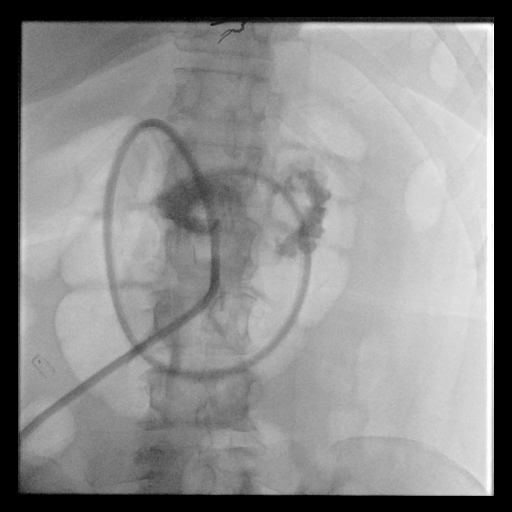
[frame 9/16]
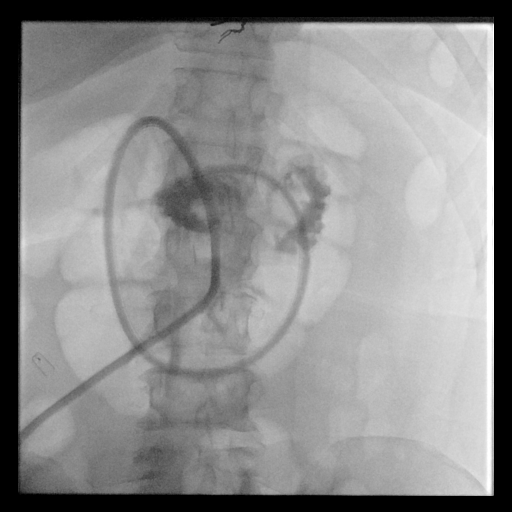
[frame 14/16]
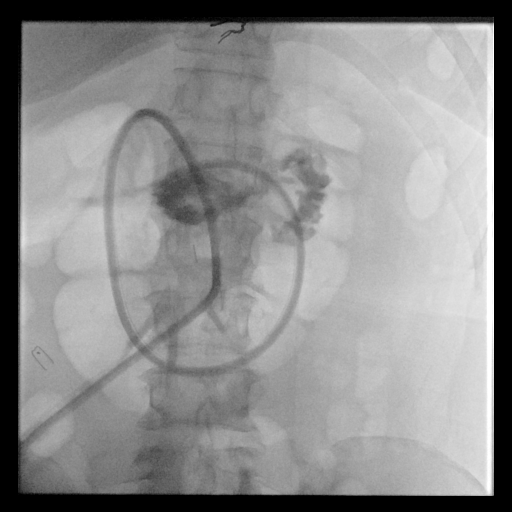

[8 of 8 positions shown; findings below may reference images not displayed]

EXAM:
EXCHANGE OF GASTROJEJUNAL FEEDING TUBE UNDER FLUOROSCOPY

MEDICATIONS:
None

ANESTHESIA/SEDATION:
None

CONTRAST:  10 mL Omnipaque 300

FLUOROSCOPY TIME:  Fluoroscopy Time: 2 minutes and 18 seconds.

COMPLICATIONS:
None immediate.

PROCEDURE:
Informed written consent was obtained from the patient after a
thorough discussion of the procedural risks, benefits and
alternatives. All questions were addressed. Maximal Sterile Barrier
Technique was utilized including caps, mask, sterile gowns, sterile
gloves, sterile drape, hand hygiene and skin antiseptic. A timeout
was performed prior to the initiation of the procedure.

The pre-existing 18 French gastrojejunal feeding tube was imaged
under fluoroscopy and injected with contrast. The retention balloon
was deflated, the tube was retracted then removed over a guidewire.
A 5 French catheter was advanced over the guidewire and used to
further gain access through the duodenum and into the proximal
jejunum. Over the wire, a new 20 French balloon retention
single-lumen jejunostomy catheter was advanced. Catheter position
was confirmed by fluoroscopy after contrast injection. The catheter
was flushed. The retention balloon within the stomach was inflated
with 9 mL of saline.
FINDINGS: Initial fluoroscopy and contrast injection demonstrates that the
pre-existing gastrojejunal tube has recoiled mostly into the stomach
with the catheter tip located in the region of the duodenal bulb.
The tube remains patent. After exchange, a single lumen, slightly
larger jejunostomy catheter was able to be advanced through the
stomach, duodenum and well into the proximal jejunum. This tube may
be used immediately for feeding and medication.
IMPRESSION: Exchange of retracted 18 French gastrojejunal feeding tube for new
20 French single-lumen balloon retention jejunostomy catheter
advanced through the stomach and duodenum into the proximal jejunum.
